# Patient Record
Sex: Male | Born: 1960 | Race: White | Hispanic: No | Marital: Married | Smoking: Former smoker
Health system: Southern US, Community
[De-identification: ages and names within clinical notes are randomized; demographics above are authoritative.]

## PROBLEM LIST (undated history)

## (undated) DIAGNOSIS — G44009 Cluster headache syndrome, unspecified, not intractable: Secondary | ICD-10-CM

## (undated) DIAGNOSIS — E039 Hypothyroidism, unspecified: Secondary | ICD-10-CM

## (undated) DIAGNOSIS — G4730 Sleep apnea, unspecified: Secondary | ICD-10-CM

## (undated) DIAGNOSIS — E785 Hyperlipidemia, unspecified: Secondary | ICD-10-CM

## (undated) DIAGNOSIS — F419 Anxiety disorder, unspecified: Secondary | ICD-10-CM

## (undated) DIAGNOSIS — K219 Gastro-esophageal reflux disease without esophagitis: Secondary | ICD-10-CM

## (undated) DIAGNOSIS — F32A Depression, unspecified: Secondary | ICD-10-CM

## (undated) DIAGNOSIS — I10 Essential (primary) hypertension: Secondary | ICD-10-CM

## (undated) DIAGNOSIS — I2510 Atherosclerotic heart disease of native coronary artery without angina pectoris: Secondary | ICD-10-CM

## (undated) DIAGNOSIS — R002 Palpitations: Secondary | ICD-10-CM

## (undated) HISTORY — DX: Hyperlipidemia, unspecified: E78.5

## (undated) HISTORY — DX: Palpitations: R00.2

## (undated) HISTORY — PX: TONSILLECTOMY AND ADENOIDECTOMY: SUR1326

## (undated) HISTORY — DX: Hypothyroidism, unspecified: E03.9

---

## 2008-11-17 ENCOUNTER — Encounter: Payer: Self-pay | Admitting: Cardiology

## 2010-06-11 ENCOUNTER — Emergency Department (INDEPENDENT_AMBULATORY_CARE_PROVIDER_SITE_OTHER): Payer: 59

## 2010-06-11 ENCOUNTER — Emergency Department (HOSPITAL_BASED_OUTPATIENT_CLINIC_OR_DEPARTMENT_OTHER)
Admission: EM | Admit: 2010-06-11 | Discharge: 2010-06-11 | Disposition: A | Discharge status: Home / Self Care | Payer: 59 | Attending: Emergency Medicine | Admitting: Emergency Medicine

## 2010-06-11 DIAGNOSIS — M545 Low back pain, unspecified: Secondary | ICD-10-CM | POA: Insufficient documentation

## 2010-06-11 DIAGNOSIS — M79609 Pain in unspecified limb: Secondary | ICD-10-CM | POA: Insufficient documentation

## 2010-06-11 DIAGNOSIS — G8929 Other chronic pain: Secondary | ICD-10-CM | POA: Insufficient documentation

## 2010-06-12 ENCOUNTER — Other Ambulatory Visit: Payer: Self-pay | Admitting: Family Medicine

## 2010-06-12 ENCOUNTER — Ambulatory Visit
Admission: RE | Admit: 2010-06-12 | Discharge: 2010-06-12 | Disposition: A | Discharge status: Home / Self Care | Payer: 59 | Source: Ambulatory Visit | Attending: Family Medicine | Admitting: Family Medicine

## 2010-06-12 DIAGNOSIS — M545 Low back pain, unspecified: Secondary | ICD-10-CM

## 2011-11-07 ENCOUNTER — Emergency Department (HOSPITAL_BASED_OUTPATIENT_CLINIC_OR_DEPARTMENT_OTHER): Payer: 59

## 2011-11-07 ENCOUNTER — Emergency Department (HOSPITAL_BASED_OUTPATIENT_CLINIC_OR_DEPARTMENT_OTHER)
Admission: EM | Admit: 2011-11-07 | Discharge: 2011-11-07 | Disposition: A | Discharge status: Home / Self Care | Payer: 59 | Attending: Emergency Medicine | Admitting: Emergency Medicine

## 2011-11-07 ENCOUNTER — Encounter (HOSPITAL_BASED_OUTPATIENT_CLINIC_OR_DEPARTMENT_OTHER): Payer: Self-pay

## 2011-11-07 DIAGNOSIS — E039 Hypothyroidism, unspecified: Secondary | ICD-10-CM | POA: Insufficient documentation

## 2011-11-07 DIAGNOSIS — E785 Hyperlipidemia, unspecified: Secondary | ICD-10-CM | POA: Insufficient documentation

## 2011-11-07 DIAGNOSIS — Z87891 Personal history of nicotine dependence: Secondary | ICD-10-CM | POA: Insufficient documentation

## 2011-11-07 DIAGNOSIS — R51 Headache: Secondary | ICD-10-CM

## 2011-11-07 HISTORY — DX: Cluster headache syndrome, unspecified, not intractable: G44.009

## 2011-11-07 MED ORDER — HYDROMORPHONE HCL PF 2 MG/ML IJ SOLN
2.0000 mg | Freq: Once | INTRAMUSCULAR | Status: AC
  Administered 2011-11-07: 2 mg via INTRAMUSCULAR
  Filled 2011-11-07: qty 1

## 2011-11-07 MED ORDER — PROMETHAZINE HCL 25 MG/ML IJ SOLN
25.0000 mg | Freq: Once | INTRAMUSCULAR | Status: AC
  Administered 2011-11-07: 25 mg via INTRAMUSCULAR
  Filled 2011-11-07: qty 1

## 2011-11-07 MED ORDER — HYDROMORPHONE HCL PF 1 MG/ML IJ SOLN
1.0000 mg | Freq: Once | INTRAMUSCULAR | Status: AC
  Administered 2011-11-07: 1 mg via INTRAMUSCULAR

## 2011-11-07 MED ORDER — BUTORPHANOL TARTRATE 10 MG/ML NA SOLN: 1.0000 | NASAL | Status: AC | PRN

## 2011-11-07 MED ORDER — HYDROMORPHONE HCL PF 1 MG/ML IJ SOLN
INTRAMUSCULAR | Status: AC
  Administered 2011-11-07: 1 mg via INTRAMUSCULAR
  Filled 2011-11-07: qty 1

## 2011-11-07 NOTE — ED Provider Notes (Signed)
History     CSN: [id number]  Arrival date & time 11/07/11  1329   First MD Initiated Contact with Patient 11/07/11 1349      Chief Complaint  Patient presents with  . Headache    (Consider location/radiation/quality/duration/timing/severity/associated sxs/prior treatment) HPI Comments: Has a history of similar complaints about 15 years ago.  Was seen in the ER, had a ct scan done and followed up with a neurologist who felt as though he was experiencing cluster headaches.  Patient is a 51 y.o. male presenting with headaches. The history is provided by the patient.  Headache  This is a new problem. Episode onset: 3 days ago. The problem occurs constantly. The problem has been gradually worsening. The headache is associated with bright light. Pain location: top of head. The quality of the pain is described as throbbing. The pain is severe. The pain does not radiate. Pertinent negatives include no anorexia, no fever, no malaise/fatigue, no palpitations, no nausea and no vomiting. He has tried NSAIDs, oral narcotic analgesics and cold packs for the symptoms. The treatment provided no relief.    Past Medical History  Diagnosis Date  . Palpitations   . Hypothyroidism   . Hyperlipidemia     Mild  . Headache, cluster     History reviewed. No pertinent past surgical history.  Family History  Problem Relation Age of Onset  . Hypertension Mother   . Hyperlipidemia Father   . Hypertension Father   . Heart attack Maternal Uncle 51  . Heart attack Maternal Grandfather 57    History  Substance Use Topics  . Smoking status: Former Games developer  . Smokeless tobacco: Not on file  . Alcohol Use: Yes      Review of Systems  Constitutional: Negative for fever and malaise/fatigue.  Cardiovascular: Negative for palpitations.  Gastrointestinal: Negative for nausea, vomiting and anorexia.  Neurological: Positive for headaches.  All other systems reviewed and are negative.    Allergies    Review of patient's allergies indicates no known allergies.  Home Medications   Current Outpatient Rx  Name Route Sig Dispense Refill  . BENADRYL PO Oral Take by mouth.    Marland Kitchen LEVOTHYROXINE SODIUM 88 MCG PO TABS Oral Take 88 mcg by mouth daily.    Marland Kitchen SLEEP AID PO Oral Take by mouth as needed.    Marland Kitchen ESOMEPRAZOLE MAGNESIUM 40 MG PO CPDR Oral Take 40 mg by mouth daily before breakfast.    . HYDROCODONE-ACETAMINOPHEN PO Oral Take by mouth as needed.    Marland Kitchen RANOLAZINE ER 500 MG PO TB12 Oral Take 500 mg by mouth as directed.      BP 151/89  Temp 98.3 F (36.8 C) (Oral)  Resp 20  Ht 6\' 1"  (1.854 m)  Wt 240 lb (108.863 kg)  BMI 31.66 kg/m2  SpO2 97%  Physical Exam  Nursing note and vitals reviewed. Constitutional: He is oriented to person, place, and time. He appears well-developed and well-nourished. No distress.  HENT:  Head: Normocephalic and atraumatic.  Mouth/Throat: Oropharynx is clear and moist.  Eyes: EOM are normal. Pupils are equal, round, and reactive to light.  Neck: Normal range of motion. Neck supple.  Musculoskeletal: Normal range of motion. He exhibits no edema.  Neurological: He is alert and oriented to person, place, and time. No cranial nerve deficit. He exhibits normal muscle tone. Coordination normal.  Skin: Skin is warm and dry. He is not diaphoretic.    ED Course  Procedures (including critical care time)  Labs Reviewed - No data to display No results found.   No diagnosis found.    MDM  The ct scan looks okay.  There is no evidence of bleed or tumor.  He says that he had similar symptoms about 15 years ago and was told had a cluster headache by a neurologist but does not recall who he saw.  The neurologist gave him Stadol nasal spray which seemed to help.  He was given dilaudid and phenergan today and is feeling somewhat better.  He will be discharged with stadol, to follow up with neurologist if not improving.          Geoffery Lyons, MD 11/07/11  [phone]

## 2011-11-07 NOTE — ED Notes (Signed)
C/o HA to top of head x 4-5 days-reports hx of same feels like "cluster HA"

## 2012-07-07 ENCOUNTER — Institutional Professional Consult (permissible substitution): Payer: 59 | Admitting: Pulmonary Disease

## 2012-07-27 ENCOUNTER — Encounter: Payer: Self-pay | Admitting: Pulmonary Disease

## 2012-07-27 ENCOUNTER — Ambulatory Visit (INDEPENDENT_AMBULATORY_CARE_PROVIDER_SITE_OTHER): Payer: 59 | Admitting: Pulmonary Disease

## 2012-07-27 VITALS — BP 122/82 | HR 85 | Temp 97.5°F | Ht 73.0 in | Wt 256.4 lb

## 2012-07-27 DIAGNOSIS — G4733 Obstructive sleep apnea (adult) (pediatric): Secondary | ICD-10-CM | POA: Insufficient documentation

## 2012-07-27 NOTE — Assessment & Plan Note (Addendum)
Given excessive daytime somnolence, narrow pharyngeal exam, witnessed apneas & loud snoring, obstructive sleep apnea is very likely & an overnight polysomnogram will be scheduled as a split study. The pathophysiology of obstructive sleep apnea , it's cardiovascular consequences & modes of treatment including CPAP were discused with the patient in detail & they evidenced understanding.  You likely have obstructive sleep apnea  Proceed with home study Options would be home CPAP titration vs in lab. Given his inability to tolerate auto CPAP settings on his home trial would suggest and in lab titration so that the technician attendants can troubleshoot problems as they arise during his first night with CPAP

## 2012-07-27 NOTE — Progress Notes (Signed)
Subjective:    Patient ID: Jerry Keller, male    DOB: 11-25-1960, 52 y.o.   MRN: [id number]  HPI  52 year old remote smoker self-referred for evaluation of obstructive sleep apnea. His wife has witnessed apneas and loud snoring. Epworth sleepiness score is 10 and a report sleepiness while laying down to rest in the afternoon or sitting quietly after lunch and occasionally when sitting and reading. He reports at least a couple of episodes where he has file sleepy while driving. He reports weekend naps for 2 hours in the recliner. Bedtime is a 30 p.m., sleep latency is about 5 minutes, he starts sleeping on his back and hip and stomach with 2 pillows, he is at least one awakening for nocturia and several arousals due to snoring. He is out of bed at 5:30 AM feeling tired and occasional headache requiring Tylenol sinus. He thinks he is a mouth breather. He drinks one Diet Coke supper and denies excessive caffeinated beverages or alcohol use. He was diagnosed with low testosterone level and this has been treated with supplements. He obtained his CPAP machine from a friend and trial this but was unable to tolerate a full face mask or a nasal mask on auto settings.     Past Medical History  Diagnosis Date  . Palpitations   . Hypothyroidism   . Hyperlipidemia     Mild  . Headache, cluster     Past Surgical History  Procedure Laterality Date  . Tonsillectomy and adenoidectomy      as a child    No Known Allergies  History   Social History  . Marital Status: Married    Spouse Name: N/A    Number of Children: N/A  . Years of Education: N/A   Occupational History  . maintaince    Social History Main Topics  . Smoking status: Former Smoker -- 1.00 packs/day for 6 years    Types: Cigarettes    Quit date: 04/15/2000  . Smokeless tobacco: Not on file  . Alcohol Use: Yes     Comment: socially  . Drug Use: No  . Sexually Active: Not on file   Other Topics Concern  . Not on file    Social History Narrative  . No narrative on file    Family History  Problem Relation Age of Onset  . Hypertension Mother   . Hyperlipidemia Father   . Hypertension Father   . Heart attack Maternal Uncle 51  . Heart attack Maternal Grandfather 57  . Prostate cancer Father   . Sleep apnea Father      Review of Systems Constitutional: negative for anorexia, fevers and sweats  Eyes: negative for irritation, redness and visual disturbance  Ears, nose, mouth, throat, and face: negative for earaches, epistaxis, nasal congestion and sore throat  Respiratory: negative for cough, dyspnea on exertion, sputum and wheezing  Cardiovascular: negative for chest pain, dyspnea, lower extremity edema, orthopnea, palpitations and syncope  Gastrointestinal: negative for abdominal pain, constipation, diarrhea, melena, nausea and vomiting  Genitourinary:negative for dysuria, frequency and hematuria  Hematologic/lymphatic: negative for bleeding, easy bruising and lymphadenopathy  Musculoskeletal:negative for arthralgias, muscle weakness and stiff joints  Neurological: negative for coordination problems, gait problems, headaches and weakness  Endocrine: negative for diabetic symptoms including polydipsia, polyuria and weight loss     Objective:   Physical Exam   Gen. Pleasant, obese, in no distress, normal affect ENT - no lesions, no post nasal drip, class 2-3 airway Neck: No JVD, no thyromegaly,  no carotid bruits Lungs: no use of accessory muscles, no dullness to percussion, decreased without rales or rhonchi  Cardiovascular: Rhythm regular, heart sounds  normal, no murmurs or gallops, no peripheral edema Abdomen: soft and non-tender, no hepatosplenomegaly, BS normal. Musculoskeletal: No deformities, no cyanosis or clubbing Neuro:  alert, non focal, no tremors        Assessment & Plan:

## 2012-07-27 NOTE — Patient Instructions (Addendum)
You likely have obstructive sleep apnea  Proceed with home study Options would be home CPAP titration vs in lab

## 2012-08-03 ENCOUNTER — Telehealth: Payer: Self-pay | Admitting: Pulmonary Disease

## 2012-08-03 NOTE — Telephone Encounter (Signed)
Called and spoke to pt his [facility] does not required precert for home sleep test he wull pick up alice 08/04/12  Tobe Sos

## 2012-08-07 ENCOUNTER — Ambulatory Visit (INDEPENDENT_AMBULATORY_CARE_PROVIDER_SITE_OTHER): Payer: 59

## 2012-08-07 DIAGNOSIS — G4733 Obstructive sleep apnea (adult) (pediatric): Secondary | ICD-10-CM

## 2012-08-09 DIAGNOSIS — G4733 Obstructive sleep apnea (adult) (pediatric): Secondary | ICD-10-CM

## 2012-08-10 ENCOUNTER — Encounter: Payer: Self-pay | Admitting: *Deleted

## 2012-08-18 ENCOUNTER — Telehealth: Payer: Self-pay | Admitting: Pulmonary Disease

## 2012-08-18 DIAGNOSIS — G4733 Obstructive sleep apnea (adult) (pediatric): Secondary | ICD-10-CM

## 2012-08-18 NOTE — Telephone Encounter (Signed)
Home study showed severe OSA - stopped breathing 80 times/h Suggest CPAP titration study

## 2012-08-20 ENCOUNTER — Other Ambulatory Visit: Payer: Self-pay | Admitting: Pulmonary Disease

## 2012-08-20 DIAGNOSIS — G4733 Obstructive sleep apnea (adult) (pediatric): Secondary | ICD-10-CM

## 2012-08-20 NOTE — Telephone Encounter (Signed)
Jerry Keller spoke with pt.

## 2012-08-20 NOTE — Telephone Encounter (Signed)
lmomtcb x1 for pt 

## 2012-08-31 ENCOUNTER — Ambulatory Visit (HOSPITAL_BASED_OUTPATIENT_CLINIC_OR_DEPARTMENT_OTHER): Payer: 59 | Attending: Pulmonary Disease

## 2012-08-31 VITALS — Ht 73.0 in | Wt 244.0 lb

## 2012-08-31 DIAGNOSIS — R0609 Other forms of dyspnea: Secondary | ICD-10-CM | POA: Insufficient documentation

## 2012-08-31 DIAGNOSIS — Z9989 Dependence on other enabling machines and devices: Secondary | ICD-10-CM

## 2012-08-31 DIAGNOSIS — K3189 Other diseases of stomach and duodenum: Secondary | ICD-10-CM | POA: Insufficient documentation

## 2012-08-31 DIAGNOSIS — R6889 Other general symptoms and signs: Secondary | ICD-10-CM | POA: Insufficient documentation

## 2012-08-31 DIAGNOSIS — R5383 Other fatigue: Secondary | ICD-10-CM | POA: Insufficient documentation

## 2012-08-31 DIAGNOSIS — R0989 Other specified symptoms and signs involving the circulatory and respiratory systems: Secondary | ICD-10-CM | POA: Insufficient documentation

## 2012-08-31 DIAGNOSIS — G4730 Sleep apnea, unspecified: Secondary | ICD-10-CM | POA: Insufficient documentation

## 2012-08-31 DIAGNOSIS — R5381 Other malaise: Secondary | ICD-10-CM | POA: Insufficient documentation

## 2012-08-31 DIAGNOSIS — R51 Headache: Secondary | ICD-10-CM | POA: Insufficient documentation

## 2012-08-31 DIAGNOSIS — G4733 Obstructive sleep apnea (adult) (pediatric): Secondary | ICD-10-CM

## 2012-09-11 ENCOUNTER — Telehealth: Payer: Self-pay | Admitting: Pulmonary Disease

## 2012-09-11 DIAGNOSIS — G4733 Obstructive sleep apnea (adult) (pediatric): Secondary | ICD-10-CM

## 2012-09-11 DIAGNOSIS — G4710 Hypersomnia, unspecified: Secondary | ICD-10-CM

## 2012-09-11 DIAGNOSIS — G4730 Sleep apnea, unspecified: Secondary | ICD-10-CM

## 2012-09-11 NOTE — Telephone Encounter (Signed)
CPAP titration showed correction with CPAP 12 cm, standard Airfit N10 mask, humidity, download in 4 wks Rx has been sent

## 2012-09-11 NOTE — Telephone Encounter (Signed)
Spoke with the pt and notified of results per RA He verbalized understanding and denied any questions

## 2012-09-11 NOTE — Telephone Encounter (Signed)
lmtcb x1 

## 2012-09-12 NOTE — Procedures (Signed)
NAME:  Jerry Keller, Jerry Keller NO.:  [id number]  MEDICAL RECORD NO.:  [id number]          PATIENT TYPE:  OUT  LOCATION:  SLEEP CENTER                 FACILITY:  [facility]  PHYSICIAN:  Oretha Milch, MD      DATE OF BIRTH:  [birth year]  DATE OF STUDY:  09/11/2012                           NOCTURNAL POLYSOMNOGRAM  REFERRING PHYSICIAN:  Oretha Milch, MD  INDICATION FOR STUDY:  Jerry Keller is a 52 year old woman with obstructive sleep apnea with witnessed apneas, and loud snoring.  At the time of this study, he weighed 244 pounds with a height of 6 feet 1 inches, BMI of 32, neck size of 19 inches.  Epworth sleepiness score of 10.  This CPAP titration study was performed with a sleep technologist in attendance.  EEG, EOG, EMG, EKG, and respiratory parameters were recorded.  Sleep stages, arousals, limb movements, and respiratory data were scored according to criteria laid out by the American Academy of Sleep Medicine.  EPWORTH SLEEPINESS SCORE:10   SLEEP ARCHITECTURE:  Lights out was at 9:44 p.m., lights on was at 3:40 p.m.  Total sleep time was 370 minutes with a sleep period time of 333 minutes and a sleep efficiency of 89%.  Sleep latency was 7 minutes with a latency to REM sleep of 62 minutes and awake after sleep onset of 33 minutes.  Sleep stages as a percentage of total sleep time was N1 4%, N2 65%, N3 5%, REM sleep 25% (80 minutes).  Supine sleep accounted for 242 minutes and supine REM sleep was noted for 55 minutes.  REM sleep was noted in 3-4 stages with the longest being around 11 a.m.  RESPIRATORY DATA:  CPAP was initiated at 5 cm with a standard full-face mask.  CPAP was titrated to 13 cm due to snoring and residual events at a level of 12 cm for 62 minutes of sleep including 4.5 minutes of REM sleep, 3 hypopneas were noted with an AHI of 3 events per hour and lowest desaturation of 91%.  This appears to be the optimal level used during the study. On  increasing the level to 13 cm, the patient pulled the mask off and complained of headache upon awakening.  AROUSAL DATA:  The arousal index was 4.9 events per hour.  Of these, few were spontaneous and the rest were associated with respiratory events.  LIMB MOVEMENT DATA:  No significant limb movements were noted.  OXYGEN SATURATION DATA:  The desaturation index was 10 events per hour with the lowest desaturation of 84%.  He spent less than 1 minute with a saturation less than 88%.   CARDIAC DATA:  The low heart rate was 36 beats per minute.  During REM sleep, the high heart rate recorded was an artifact.  No arrhythmias were noted.  DISCUSSION:  He was desensitized with a standard full-face mask.  While most events were controlled on 11 cm, CPAP was titrated further for snoring. He did not seemed to tolerate 13 cm well.   IMPRESSIONS-RECOMMENDATIONS: 1. Moderate obstructive sleep apnea with hypopneas causing sleep     fragmentation and oxygen desaturation. 2. This was corrected by CPAP of 12 cm with  a standard full-face mask.     Titration appears to be optimal. 3. No evidence of cardiac arrhythmias, limb movements, or behavioral     disturbance during sleep.  RECOMMENDATION: 1. CPAP can be initiated at 12 cm with a standard full-face mask and     compliance monitored at this level. 2. He should be cautioned against driving when sleepy.  He should be     asked to avoid medications with sedative side effects.     Oretha Milch, MD    RVA/MEDQ  D:  09/11/2012 12:11:49  T:  09/12/2012 00:16:42  Job:  [id number]

## 2012-09-15 ENCOUNTER — Other Ambulatory Visit: Payer: Self-pay | Admitting: Pulmonary Disease

## 2012-09-15 DIAGNOSIS — G4733 Obstructive sleep apnea (adult) (pediatric): Secondary | ICD-10-CM

## 2012-09-22 ENCOUNTER — Telehealth: Payer: Self-pay | Admitting: Pulmonary Disease

## 2012-09-22 NOTE — Telephone Encounter (Signed)
lmomtcb x1 for heather 

## 2012-09-23 NOTE — Telephone Encounter (Signed)
Called, spoke with Herbert Seta.  States she has actually already received all of the information needed from a Irina Okelly at [employer].  Nothing further needed at this time.

## 2012-11-10 ENCOUNTER — Encounter: Payer: Self-pay | Admitting: *Deleted

## 2012-11-21 ENCOUNTER — Telehealth: Payer: Self-pay | Admitting: Pulmonary Disease

## 2012-11-21 NOTE — Telephone Encounter (Signed)
He needs post cpap Fu OV Download  8/14 >> good usage, good control of events on 12 cm

## 2012-11-23 NOTE — Telephone Encounter (Signed)
Pt appt scheduled. Nothing further needed

## 2012-11-23 NOTE — Telephone Encounter (Signed)
LMTCB x1 for pt.  

## 2013-01-11 ENCOUNTER — Ambulatory Visit: Payer: 59 | Admitting: Pulmonary Disease

## 2013-07-07 ENCOUNTER — Other Ambulatory Visit: Payer: Self-pay | Admitting: Gastroenterology

## 2013-07-09 ENCOUNTER — Encounter (HOSPITAL_COMMUNITY): Payer: Self-pay | Admitting: *Deleted

## 2013-07-12 ENCOUNTER — Encounter (HOSPITAL_COMMUNITY): Payer: Self-pay | Admitting: Pharmacy Technician

## 2013-07-30 ENCOUNTER — Encounter (HOSPITAL_COMMUNITY): Payer: 59 | Admitting: Anesthesiology

## 2013-07-30 ENCOUNTER — Ambulatory Visit (HOSPITAL_COMMUNITY)
Admission: RE | Admit: 2013-07-30 | Discharge: 2013-07-30 | Disposition: A | Discharge status: Home / Self Care | Payer: 59 | Source: Ambulatory Visit | Attending: Gastroenterology | Admitting: Gastroenterology

## 2013-07-30 ENCOUNTER — Encounter (HOSPITAL_COMMUNITY): Payer: Self-pay

## 2013-07-30 ENCOUNTER — Encounter (HOSPITAL_COMMUNITY)
Admission: RE | Disposition: A | Discharge status: Home / Self Care | Payer: Self-pay | Source: Ambulatory Visit | Attending: Gastroenterology

## 2013-07-30 ENCOUNTER — Ambulatory Visit (HOSPITAL_COMMUNITY): Payer: 59 | Admitting: Anesthesiology

## 2013-07-30 DIAGNOSIS — K921 Melena: Secondary | ICD-10-CM | POA: Insufficient documentation

## 2013-07-30 DIAGNOSIS — E039 Hypothyroidism, unspecified: Secondary | ICD-10-CM | POA: Insufficient documentation

## 2013-07-30 DIAGNOSIS — Z87891 Personal history of nicotine dependence: Secondary | ICD-10-CM | POA: Insufficient documentation

## 2013-07-30 DIAGNOSIS — E785 Hyperlipidemia, unspecified: Secondary | ICD-10-CM | POA: Insufficient documentation

## 2013-07-30 DIAGNOSIS — G4730 Sleep apnea, unspecified: Secondary | ICD-10-CM | POA: Insufficient documentation

## 2013-07-30 DIAGNOSIS — R079 Chest pain, unspecified: Secondary | ICD-10-CM | POA: Insufficient documentation

## 2013-07-30 HISTORY — PX: COLONOSCOPY: SHX5424

## 2013-07-30 SURGERY — COLONOSCOPY
Anesthesia: Monitor Anesthesia Care

## 2013-07-30 MED ORDER — PROPOFOL 10 MG/ML IV BOLUS
INTRAVENOUS | Status: AC
  Filled 2013-07-30: qty 20

## 2013-07-30 MED ORDER — KETAMINE HCL 10 MG/ML IJ SOLN
INTRAMUSCULAR | Status: DC | PRN
  Administered 2013-07-30: 20 mg via INTRAVENOUS
  Administered 2013-07-30: 10 mg via INTRAVENOUS

## 2013-07-30 MED ORDER — MIDAZOLAM HCL 5 MG/5ML IJ SOLN
INTRAMUSCULAR | Status: DC | PRN
  Administered 2013-07-30: 2 mg via INTRAVENOUS

## 2013-07-30 MED ORDER — PROPOFOL INFUSION 10 MG/ML OPTIME
INTRAVENOUS | Status: DC | PRN
  Administered 2013-07-30: 100 ug/kg/min via INTRAVENOUS

## 2013-07-30 MED ORDER — LIDOCAINE HCL (CARDIAC) 20 MG/ML IV SOLN
INTRAVENOUS | Status: AC
Start: 2013-07-30 — End: 2013-07-30
  Filled 2013-07-30: qty 5

## 2013-07-30 MED ORDER — SODIUM CHLORIDE 0.9 % IV SOLN: INTRAVENOUS | Status: DC

## 2013-07-30 MED ORDER — FENTANYL CITRATE 0.05 MG/ML IJ SOLN: INTRAMUSCULAR | Status: DC | PRN

## 2013-07-30 MED ORDER — LACTATED RINGERS IV SOLN
INTRAVENOUS | Status: DC
  Administered 2013-07-30: 1000 mL via INTRAVENOUS

## 2013-07-30 MED ORDER — LIDOCAINE HCL (CARDIAC) 20 MG/ML IV SOLN
INTRAVENOUS | Status: DC | PRN
  Administered 2013-07-30: 100 mg via INTRAVENOUS

## 2013-07-30 MED ORDER — MIDAZOLAM HCL 2 MG/2ML IJ SOLN
INTRAMUSCULAR | Status: AC
  Filled 2013-07-30: qty 2

## 2013-07-30 NOTE — Anesthesia Preprocedure Evaluation (Addendum)
Anesthesia Evaluation    Airway       Dental   Pulmonary former smoker,          Cardiovascular     Neuro/Psych    GI/Hepatic   Endo/Other    Renal/GU      Musculoskeletal   Abdominal   Peds  Hematology   Anesthesia Other Findings   Reproductive/Obstetrics                          Anesthesia Physical Anesthesia Plan  ASA: II  Anesthesia Plan: MAC   Post-op Pain Management:    Induction: Intravenous  Airway Management Planned: Simple Face Mask  Additional Equipment:   Intra-op Plan:   Post-operative Plan:   Informed Consent:   Plan Discussed with:   Anesthesia Plan Comments:         Anesthesia Quick Evaluation

## 2013-07-30 NOTE — Anesthesia Postprocedure Evaluation (Signed)
  Anesthesia Post-op Note  Patient: Jerry Keller  Procedure(s) Performed: Procedure(s) (LRB): COLONOSCOPY (N/A)  Patient Location: PACU  Anesthesia Type: MAC  Level of Consciousness: awake and alert   Airway and Oxygen Therapy: Patient Spontanous Breathing  Post-op Pain: mild  Post-op Assessment: Post-op Vital signs reviewed, Patient's Cardiovascular Status Stable, Respiratory Function Stable, Patent Airway and No signs of Nausea or vomiting  Last Vitals:  Filed Vitals:   07/30/13 1050  BP: 168/93  Pulse:   Temp:   Resp: 14    Post-op Vital Signs: stable   Complications: No apparent anesthesia complications

## 2013-07-30 NOTE — Op Note (Signed)
[facility] [place], [address]   OPERATIVE PROCEDURE REPORT  PATIENT: Jerry Keller, Jerry Keller  MR#: [id number] BIRTHDATE: 1960-05-22  GENDER: Male ENDOSCOPIST: Carol Ada, MD ASSISTANT:   Sharon Mt, Endo Technician Hilma Favors, RN PROCEDURE DATE: 07/30/2013 PROCEDURE:   Colonoscopy with snare polypectomy ASA CLASS:   Class III INDICATIONS:Colorectal cancer screening. MEDICATIONS: MAC sedation, administered by CRNA  DESCRIPTION OF PROCEDURE:   After the risks benefits and alternatives of the procedure were thoroughly explained, informed consent was obtained.  A digital rectal exam revealed no abnormalities of the rectum.    The Pentax Ped Colon [id number] endoscope was introduced through the anus  and advanced to the cecum, which was identified by both the appendix and ileocecal valve , No adverse events experienced.    The quality of the prep was excellent. .  The instrument was then slowly withdrawn as the colon was fully examined.     FINDINGS: A 4 mm sessile descending colon polyp was removed with a cold snare.  No evidence of any masses, inflammation, ulcerations, erosions, or vascular abnormalities.   Retroflexed views revealed no abnormalities.     The scope was then withdrawn from the patient and the procedure terminated.  COMPLICATIONS: There were no complications.  IMPRESSION: 1) Polyp.  RECOMMENDATIONS: 1) Await biopsy results. 2) Repeat the colonoscopy in 5-10 years.  _______________________________ eSignedCarol Ada, MD 07/30/2013 10:27 AM

## 2013-07-30 NOTE — Transfer of Care (Signed)
Immediate Anesthesia Transfer of Care Note  Patient: Jerry Keller  Procedure(s) Performed: Procedure(s): COLONOSCOPY (N/A)  Patient Location: PACU and Endoscopy Unit  Anesthesia Type:MAC  Level of Consciousness: awake, alert , oriented and patient cooperative  Airway & Oxygen Therapy: Patient Spontanous Breathing and Patient connected to face mask oxygen  Post-op Assessment: Report given to PACU RN, Post -op Vital signs reviewed and stable and Patient moving all extremities  Post vital signs: Reviewed and stable  Complications: No apparent anesthesia complications

## 2013-07-30 NOTE — Discharge Instructions (Signed)
Colonoscopy, Care After °Refer to this sheet in the next few weeks. These instructions provide you with information on caring for yourself after your procedure. Your health care provider may also give you more specific instructions. Your treatment has been planned according to current medical practices, but problems sometimes occur. Call your health care provider if you have any problems or questions after your procedure. °WHAT TO EXPECT AFTER THE PROCEDURE  °After your procedure, it is typical to have the following: °· A small amount of blood in your stool. °· Moderate amounts of gas and mild abdominal cramping or bloating. °HOME CARE INSTRUCTIONS °· Do not drive, operate machinery, or sign important documents for 24 hours. °· You may shower and resume your regular physical activities, but move at a slower pace for the first 24 hours. °· Take frequent rest periods for the first 24 hours. °· Walk around or put a warm pack on your abdomen to help reduce abdominal cramping and bloating. °· Drink enough fluids to keep your urine clear or pale yellow. °· You may resume your normal diet as instructed by your health care provider. Avoid heavy or fried foods that are hard to digest. °· Avoid drinking alcohol for 24 hours or as instructed by your health care provider. °· Only take over-the-counter or prescription medicines as directed by your health care provider. °· If a tissue sample (biopsy) was taken during your procedure: °· Do not take aspirin or blood thinners for 7 days, or as instructed by your health care provider. °· Do not drink alcohol for 7 days, or as instructed by your health care provider. °· Eat soft foods for the first 24 hours. °SEEK MEDICAL CARE IF: °You have persistent spotting of blood in your stool 2 3 days after the procedure. °SEEK IMMEDIATE MEDICAL CARE IF: °· You have more than a small spotting of blood in your stool. °· You pass large blood clots in your stool. °· Your abdomen is swollen  (distended). °· You have nausea or vomiting. °· You have a fever. °· You have increasing abdominal pain that is not relieved with medicine. °Document Released: 11/14/2003 Document Revised: 01/20/2013 Document Reviewed: 12/07/2012 °ExitCare® Patient Information ©2014 [employer]. ° °

## 2013-07-30 NOTE — H&P (Signed)
  Jerry Keller HPI: Two to three months ago the patient started to have hematochezia.  He attributes this to his work and taking 81 mg of ASA.  Three days ago he noticed more blood.  There is no pain with passage of a bowel movement and he denied having a prior colonoscopy.  He has a history of severe sleep apnea.  The patient also complains of chest pain and the work up was negative with Dr. Redmond Pulling.  No family history of colon cancer.  Past Medical History  Diagnosis Date  . Palpitations   . Hypothyroidism   . Hyperlipidemia     Mild  . Headache, cluster     Past Surgical History  Procedure Laterality Date  . Tonsillectomy and adenoidectomy      as a child    Family History  Problem Relation Age of Onset  . Hypertension Mother   . Hyperlipidemia Father   . Hypertension Father   . Heart attack Maternal Uncle 51  . Heart attack Maternal Grandfather 57  . Prostate cancer Father   . Sleep apnea Father     Social History:  reports that he quit smoking about 13 years ago. His smoking use included Cigarettes. He has a 6 pack-year smoking history. He does not have any smokeless tobacco history on file. He reports that he drinks alcohol. He reports that he does not use illicit drugs.  Allergies: No Known Allergies  Medications:  Scheduled:  Continuous: . sodium chloride    . lactated ringers 1,000 mL (07/30/13 0903)    No results found for this or any previous visit (from the past 24 hour(s)).   No results found.  ROS:  As stated above in the HPI otherwise negative.  Blood pressure 138/85, temperature 98.1 F (36.7 C), temperature source Oral, resp. rate 18, SpO2 96.00%.    PE: Gen: NAD, Alert and Oriented HEENT:  [place]/AT, EOMI Neck: Supple, no LAD Lungs: CTA Bilaterally CV: RRR without M/G/R ABM: Soft, NTND, +BS Ext: No C/C/E  Assessment/Plan: 1) Screening. 2) Hematochezia.  Plan: Colonoscopy.  Beryle Beams 07/30/2013, 9:51 AM

## 2013-08-02 ENCOUNTER — Encounter (HOSPITAL_COMMUNITY): Payer: Self-pay | Admitting: Gastroenterology

## 2014-03-08 ENCOUNTER — Encounter (HOSPITAL_COMMUNITY)
Admission: EM | Disposition: A | Discharge status: Home / Self Care | Payer: Self-pay | Source: Home / Self Care | Attending: Cardiovascular Disease

## 2014-03-08 ENCOUNTER — Encounter (HOSPITAL_COMMUNITY): Payer: Self-pay | Admitting: Physical Medicine and Rehabilitation

## 2014-03-08 ENCOUNTER — Observation Stay (HOSPITAL_COMMUNITY)
Admission: EM | Admit: 2014-03-08 | Discharge: 2014-03-09 | Disposition: A | Discharge status: Home / Self Care | Payer: 59 | Attending: Cardiovascular Disease | Admitting: Cardiovascular Disease

## 2014-03-08 ENCOUNTER — Emergency Department (HOSPITAL_COMMUNITY): Payer: 59

## 2014-03-08 DIAGNOSIS — I4510 Unspecified right bundle-branch block: Secondary | ICD-10-CM | POA: Insufficient documentation

## 2014-03-08 DIAGNOSIS — R0789 Other chest pain: Secondary | ICD-10-CM

## 2014-03-08 DIAGNOSIS — R079 Chest pain, unspecified: Secondary | ICD-10-CM

## 2014-03-08 DIAGNOSIS — Z6833 Body mass index (BMI) 33.0-33.9, adult: Secondary | ICD-10-CM | POA: Insufficient documentation

## 2014-03-08 DIAGNOSIS — Z8249 Family history of ischemic heart disease and other diseases of the circulatory system: Secondary | ICD-10-CM | POA: Diagnosis not present

## 2014-03-08 DIAGNOSIS — I200 Unstable angina: Secondary | ICD-10-CM | POA: Diagnosis present

## 2014-03-08 DIAGNOSIS — E785 Hyperlipidemia, unspecified: Secondary | ICD-10-CM | POA: Insufficient documentation

## 2014-03-08 DIAGNOSIS — I2510 Atherosclerotic heart disease of native coronary artery without angina pectoris: Secondary | ICD-10-CM | POA: Diagnosis not present

## 2014-03-08 DIAGNOSIS — E039 Hypothyroidism, unspecified: Secondary | ICD-10-CM | POA: Diagnosis present

## 2014-03-08 DIAGNOSIS — Z87891 Personal history of nicotine dependence: Secondary | ICD-10-CM | POA: Diagnosis not present

## 2014-03-08 DIAGNOSIS — G4733 Obstructive sleep apnea (adult) (pediatric): Secondary | ICD-10-CM | POA: Diagnosis not present

## 2014-03-08 DIAGNOSIS — I209 Angina pectoris, unspecified: Secondary | ICD-10-CM

## 2014-03-08 DIAGNOSIS — Z8042 Family history of malignant neoplasm of prostate: Secondary | ICD-10-CM | POA: Diagnosis not present

## 2014-03-08 HISTORY — DX: Sleep apnea, unspecified: G47.30

## 2014-03-08 HISTORY — DX: Atherosclerotic heart disease of native coronary artery without angina pectoris: I25.10

## 2014-03-08 HISTORY — DX: Gastro-esophageal reflux disease without esophagitis: K21.9

## 2014-03-08 HISTORY — PX: LEFT HEART CATHETERIZATION WITH CORONARY ANGIOGRAM: SHX5451

## 2014-03-08 LAB — BASIC METABOLIC PANEL
ANION GAP: 10 (ref 5–15)
BUN: 12 mg/dL (ref 6–23)
CHLORIDE: 103 meq/L (ref 96–112)
CO2: 28 mEq/L (ref 19–32)
Calcium: 9.3 mg/dL (ref 8.4–10.5)
Creatinine, Ser: 1 mg/dL (ref 0.50–1.35)
GFR calc Af Amer: >90 mL/min (ref >90)
GFR, EST NON AFRICAN AMERICAN: 85 mL/min — AB (ref >90)
Glucose, Bld: 102 mg/dL — ABNORMAL HIGH (ref 70–99)
POTASSIUM: 4.7 meq/L (ref 3.7–5.3)
Sodium: 141 mEq/L (ref 137–147)

## 2014-03-08 LAB — TSH: TSH: 1.87 u[IU]/mL (ref 0.350–4.500)

## 2014-03-08 LAB — CBG MONITORING, ED: GLUCOSE-CAPILLARY: 88 mg/dL (ref 70–99)

## 2014-03-08 LAB — CBC WITH DIFFERENTIAL/PLATELET
BASOS ABS: 0 10*3/uL (ref 0.0–0.1)
BASOS PCT: 1 % (ref 0–1)
EOS ABS: 0.2 10*3/uL (ref 0.0–0.7)
Eosinophils Relative: 3 % (ref 0–5)
HCT: 47.5 % (ref 39.0–52.0)
Hemoglobin: 15.7 g/dL (ref 13.0–17.0)
Lymphocytes Relative: 20 % (ref 12–46)
Lymphs Abs: 1.3 10*3/uL (ref 0.7–4.0)
MCH: 33.3 pg (ref 26.0–34.0)
MCHC: 33.1 g/dL (ref 30.0–36.0)
MCV: 100.8 fL — ABNORMAL HIGH (ref 78.0–100.0)
Monocytes Absolute: 0.8 10*3/uL (ref 0.1–1.0)
Monocytes Relative: 13 % — ABNORMAL HIGH (ref 3–12)
NEUTROS ABS: 4 10*3/uL (ref 1.7–7.7)
NEUTROS PCT: 63 % (ref 43–77)
Platelets: 232 10*3/uL (ref 150–400)
RBC: 4.71 MIL/uL (ref 4.22–5.81)
RDW: 12.4 % (ref 11.5–15.5)
WBC: 6.3 10*3/uL (ref 4.0–10.5)

## 2014-03-08 LAB — TROPONIN I
Troponin I: <0.3 ng/mL (ref <0.30)
Troponin I: <0.3 ng/mL (ref <0.30)

## 2014-03-08 LAB — I-STAT TROPONIN, ED: Troponin i, poc: 0 ng/mL (ref 0.00–0.08)

## 2014-03-08 LAB — MAGNESIUM: Magnesium: 2.2 mg/dL (ref 1.5–2.5)

## 2014-03-08 SURGERY — LEFT HEART CATHETERIZATION WITH CORONARY ANGIOGRAM
Anesthesia: LOCAL

## 2014-03-08 MED ORDER — METOPROLOL TARTRATE 12.5 MG HALF TABLET
12.5000 mg | ORAL_TABLET | Freq: Two times a day (BID) | ORAL | Status: DC
  Administered 2014-03-08: 12.5 mg via ORAL
  Filled 2014-03-08 (×2): qty 1

## 2014-03-08 MED ORDER — HEPARIN (PORCINE) IN NACL 2-0.9 UNIT/ML-% IJ SOLN
INTRAMUSCULAR | Status: AC
  Filled 2014-03-08: qty 1000

## 2014-03-08 MED ORDER — SALINE SPRAY 0.65 % NA SOLN
1.0000 | NASAL | Status: DC | PRN
  Filled 2014-03-08: qty 44

## 2014-03-08 MED ORDER — MORPHINE SULFATE 2 MG/ML IJ SOLN
2.0000 mg | Freq: Once | INTRAMUSCULAR | Status: AC
  Administered 2014-03-08: 2 mg via INTRAVENOUS
  Filled 2014-03-08: qty 1

## 2014-03-08 MED ORDER — ASPIRIN EC 81 MG PO TBEC: 81.0000 mg | DELAYED_RELEASE_TABLET | Freq: Every day | ORAL | Status: DC

## 2014-03-08 MED ORDER — PANTOPRAZOLE SODIUM 40 MG PO TBEC: 40.0000 mg | DELAYED_RELEASE_TABLET | Freq: Every day | ORAL | Status: DC

## 2014-03-08 MED ORDER — LEVOTHYROXINE SODIUM 88 MCG PO TABS
88.0000 ug | ORAL_TABLET | Freq: Every day | ORAL | Status: DC
  Administered 2014-03-09: 88 ug via ORAL
  Filled 2014-03-08: qty 1

## 2014-03-08 MED ORDER — NITROGLYCERIN 0.4 MG SL SUBL: 0.4000 mg | SUBLINGUAL_TABLET | SUBLINGUAL | Status: DC | PRN

## 2014-03-08 MED ORDER — FOLIC ACID 800 MCG PO TABS: 400.0000 ug | ORAL_TABLET | Freq: Every day | ORAL | Status: DC

## 2014-03-08 MED ORDER — ASPIRIN 300 MG RE SUPP: 300.0000 mg | RECTAL | Status: AC

## 2014-03-08 MED ORDER — HEPARIN SODIUM (PORCINE) 1000 UNIT/ML IJ SOLN
INTRAMUSCULAR | Status: AC
  Filled 2014-03-08: qty 1

## 2014-03-08 MED ORDER — ATORVASTATIN CALCIUM 80 MG PO TABS
80.0000 mg | ORAL_TABLET | Freq: Every day | ORAL | Status: DC
  Administered 2014-03-08: 80 mg via ORAL
  Filled 2014-03-08: qty 1

## 2014-03-08 MED ORDER — ONDANSETRON HCL 4 MG/2ML IJ SOLN: 4.0000 mg | Freq: Four times a day (QID) | INTRAMUSCULAR | Status: DC | PRN

## 2014-03-08 MED ORDER — VITAMIN B-12 1000 MCG PO TABS
2000.0000 ug | ORAL_TABLET | Freq: Every day | ORAL | Status: DC
  Filled 2014-03-08: qty 2

## 2014-03-08 MED ORDER — SODIUM CHLORIDE 0.9 % IJ SOLN: 3.0000 mL | INTRAMUSCULAR | Status: DC | PRN

## 2014-03-08 MED ORDER — VERAPAMIL HCL 2.5 MG/ML IV SOLN
INTRAVENOUS | Status: AC
  Filled 2014-03-08: qty 2

## 2014-03-08 MED ORDER — HEPARIN (PORCINE) IN NACL 100-0.45 UNIT/ML-% IJ SOLN
1350.0000 [IU]/h | INTRAMUSCULAR | Status: DC
  Administered 2014-03-08: 1350 [IU]/h via INTRAVENOUS
  Filled 2014-03-08: qty 250

## 2014-03-08 MED ORDER — MIDAZOLAM HCL 2 MG/2ML IJ SOLN
INTRAMUSCULAR | Status: AC
  Filled 2014-03-08: qty 2

## 2014-03-08 MED ORDER — HEPARIN BOLUS VIA INFUSION
4000.0000 [IU] | Freq: Once | INTRAVENOUS | Status: AC
Start: 2014-03-08 — End: 2014-03-08
  Administered 2014-03-08: 4000 [IU] via INTRAVENOUS
  Filled 2014-03-08: qty 4000

## 2014-03-08 MED ORDER — SODIUM CHLORIDE 0.9 % IV SOLN
250.0000 mL | INTRAVENOUS | Status: DC
  Administered 2014-03-08: 250 mL via INTRAVENOUS

## 2014-03-08 MED ORDER — ACETAMINOPHEN 325 MG PO TABS: 650.0000 mg | ORAL_TABLET | ORAL | Status: DC | PRN

## 2014-03-08 MED ORDER — SODIUM CHLORIDE 0.9 % IJ SOLN: 3.0000 mL | Freq: Two times a day (BID) | INTRAMUSCULAR | Status: DC

## 2014-03-08 MED ORDER — NITROGLYCERIN 1 MG/10 ML FOR IR/CATH LAB
INTRA_ARTERIAL | Status: AC
  Filled 2014-03-08: qty 10

## 2014-03-08 MED ORDER — VITAMIN B-12 1000 MCG PO TABS: 2000.0000 ug | ORAL_TABLET | Freq: Every day | ORAL | Status: DC

## 2014-03-08 MED ORDER — IBUPROFEN 200 MG PO TABS
200.0000 mg | ORAL_TABLET | Freq: Four times a day (QID) | ORAL | Status: DC | PRN
  Administered 2014-03-08: 400 mg via ORAL
  Filled 2014-03-08: qty 2

## 2014-03-08 MED ORDER — ASPIRIN 81 MG PO CHEW: 81.0000 mg | CHEWABLE_TABLET | ORAL | Status: DC

## 2014-03-08 MED ORDER — METOPROLOL TARTRATE 12.5 MG HALF TABLET: 12.5000 mg | ORAL_TABLET | Freq: Two times a day (BID) | ORAL | Status: DC

## 2014-03-08 MED ORDER — ASPIRIN 81 MG PO CHEW: 324.0000 mg | CHEWABLE_TABLET | ORAL | Status: AC

## 2014-03-08 MED ORDER — LIDOCAINE HCL (PF) 1 % IJ SOLN
INTRAMUSCULAR | Status: AC
  Filled 2014-03-08: qty 30

## 2014-03-08 MED ORDER — VITAMIN C 500 MG PO TABS
1000.0000 mg | ORAL_TABLET | Freq: Every day | ORAL | Status: DC
  Filled 2014-03-08: qty 2

## 2014-03-08 MED ORDER — FENTANYL CITRATE 0.05 MG/ML IJ SOLN
INTRAMUSCULAR | Status: AC
  Filled 2014-03-08: qty 2

## 2014-03-08 MED ORDER — SODIUM CHLORIDE 0.9 % IV SOLN: INTRAVENOUS | Status: AC

## 2014-03-08 MED ORDER — PANTOPRAZOLE SODIUM 40 MG PO TBEC
40.0000 mg | DELAYED_RELEASE_TABLET | Freq: Two times a day (BID) | ORAL | Status: DC
  Filled 2014-03-08: qty 1

## 2014-03-08 MED ORDER — FOLIC ACID 1 MG PO TABS
0.5000 mg | ORAL_TABLET | Freq: Every day | ORAL | Status: DC
  Filled 2014-03-08: qty 1

## 2014-03-08 MED ORDER — CHOLECALCIFEROL 10 MCG (400 UNIT) PO TABS
400.0000 [IU] | ORAL_TABLET | Freq: Every day | ORAL | Status: DC
  Filled 2014-03-08: qty 1

## 2014-03-08 MED ORDER — HYDROCODONE-ACETAMINOPHEN 5-325 MG PO TABS
0.5000 | ORAL_TABLET | Freq: Four times a day (QID) | ORAL | Status: DC | PRN
  Administered 2014-03-08: 1 via ORAL
  Filled 2014-03-08: qty 1

## 2014-03-08 MED ORDER — ASPIRIN 81 MG PO CHEW
324.0000 mg | CHEWABLE_TABLET | Freq: Once | ORAL | Status: AC
  Administered 2014-03-08: 324 mg via ORAL
  Filled 2014-03-08: qty 4

## 2014-03-08 MED ORDER — NITROGLYCERIN 2 % TD OINT
1.0000 [in_us] | TOPICAL_OINTMENT | Freq: Four times a day (QID) | TRANSDERMAL | Status: DC
  Administered 2014-03-08: 1 [in_us] via TOPICAL
  Filled 2014-03-08: qty 1

## 2014-03-08 MED ORDER — MAGNESIUM OXIDE 400 (241.3 MG) MG PO TABS
200.0000 mg | ORAL_TABLET | Freq: Every day | ORAL | Status: DC
  Filled 2014-03-08: qty 0.5

## 2014-03-08 MED ORDER — MAGNESIUM 250 MG PO TABS
250.0000 mg | ORAL_TABLET | Freq: Every day | ORAL | Status: DC
Start: 2014-03-08 — End: 2014-03-08

## 2014-03-08 MED ORDER — GI COCKTAIL ~~LOC~~
30.0000 mL | Freq: Once | ORAL | Status: AC
  Administered 2014-03-08: 30 mL via ORAL
  Filled 2014-03-08: qty 30

## 2014-03-08 NOTE — ED Notes (Signed)
Pt presents to department for evaluation of L sided intermittent chest pain, ongoing x2 months. 6/10 pressure sensation at the time. Respirations unlabored. Pt is alert and oriented x4.

## 2014-03-08 NOTE — ED Provider Notes (Signed)
CSN: [id number]     Arrival date & time 03/08/14  1020 History   First MD Initiated Contact with Patient 03/08/14 1030     Chief Complaint  Patient presents with  . Chest Pain   HPI Jerry Keller is a 53 year old man with history of hypothyroidism and HLD presenting with left-sided chest pressure that has been worsening over the last two months.  His chest pressure occasionally radiates to his neck and left shoulder and is associated with nausea and shortness of breath.  He initially reported that the pressure was worse with stress at work on exertion, but is currently occuring at rest and has not gone away since yesterday.  He reports diaphoresis and flushing associated with the discomfort.  He has also had some mild swelling of his legs that is worse after a long day of work.  He reports being under a lot of stress at work lately, which he thinks may be contributing.  He denies fevers, chills, or cough.  Past Medical History  Diagnosis Date  . Palpitations   . Hypothyroidism   . Hyperlipidemia     Mild  . Headache, cluster    Past Surgical History  Procedure Laterality Date  . Tonsillectomy and adenoidectomy      as a child  . Colonoscopy N/A 07/30/2013    Procedure: COLONOSCOPY;  Surgeon: Beryle Beams, MD;  Location: [facility];  Service: Endoscopy;  Laterality: N/A;   Family History  Problem Relation Age of Onset  . Hypertension Mother   . Hyperlipidemia Father   . Hypertension Father   . Heart attack Maternal Uncle 51  . Heart attack Maternal Grandfather 57  . Prostate cancer Father   . Sleep apnea Father    History  Substance Use Topics  . Smoking status: Former Smoker -- 1.00 packs/day for 6 years    Types: Cigarettes    Quit date: 04/15/2000  . Smokeless tobacco: Not on file  . Alcohol Use: Yes     Comment: socially    Review of Systems  Constitutional: Positive for diaphoresis. Negative for fever, chills, appetite change and fatigue.  HENT: Negative for  congestion, rhinorrhea and sore throat.   Eyes: Negative for visual disturbance.  Respiratory: Positive for chest tightness and shortness of breath. Negative for cough.   Cardiovascular: Positive for chest pain and leg swelling. Negative for palpitations.  Gastrointestinal: Positive for nausea. Negative for vomiting, abdominal pain, diarrhea, constipation and abdominal distention.  Genitourinary: Negative for dysuria and difficulty urinating.  Musculoskeletal: Negative for myalgias and arthralgias.  Skin: Negative for rash.  Neurological: Negative for dizziness, weakness, light-headedness, numbness and headaches.  Psychiatric/Behavioral: Negative for dysphoric mood and agitation.      Allergies  Review of patient's allergies indicates no known allergies.  Home Medications   Prior to Admission medications   Medication Sig Start Date End Date Taking? Authorizing Provider  aspirin EC 81 MG tablet Take 81 mg by mouth daily.    Historical Provider, MD  celecoxib (CELEBREX) 200 MG capsule Take 200 mg by mouth daily.    Historical Provider, MD  cholecalciferol (VITAMIN D) 400 UNITS TABS Take by mouth.    Historical Provider, MD  cyanocobalamin 2000 MCG tablet Take 1,000 mcg by mouth daily. Once a week    Historical Provider, MD  folic acid (FOLVITE) 258 MCG tablet Take 400 mcg by mouth daily.     Historical Provider, MD  HYDROcodone-acetaminophen (NORCO) 10-325 MG per tablet Take 1 tablet  by mouth every 6 (six) hours as needed for pain.    Historical Provider, MD  levothyroxine (SYNTHROID, LEVOTHROID) 88 MCG tablet Take 88 mcg by mouth daily before breakfast.     Historical Provider, MD  Magnesium 250 MG TABS Take 250 mg by mouth daily.     Historical Provider, MD  metoprolol tartrate (LOPRESSOR) 25 MG tablet Take 25 mg by mouth 2 (two) times daily. 03/03/14   Historical Provider, MD  Pyridoxine HCl (VITAMIN B-6 PO) Take 100 mg by mouth daily.     Historical Provider, MD  testosterone  cypionate (DEPOTESTOTERONE CYPIONATE) 200 MG/ML injection Inject into the muscle. Every 3 weeks    Historical Provider, MD  vitamin C (ASCORBIC ACID) 500 MG tablet Take 1,000 mg by mouth daily.     Historical Provider, MD   BP 135/90 mmHg  Pulse 71  Temp(Src) 97.4 F (36.3 C) (Oral)  Resp 15  Ht 6\' 1"  (1.854 m)  Wt 252 lb (114.306 kg)  BMI 33.25 kg/m2  SpO2 98% Physical Exam  Constitutional: He is oriented to person, place, and time. He appears well-developed and well-nourished. No distress.  HENT:  Head: Normocephalic and atraumatic.  Mouth/Throat: No oropharyngeal exudate.  Eyes: Conjunctivae and EOM are normal. Pupils are equal, round, and reactive to light. No scleral icterus.  Neck: Normal range of motion. Neck supple.  Cardiovascular: Normal rate, regular rhythm and normal heart sounds.   Pulmonary/Chest: Effort normal and breath sounds normal. No respiratory distress.  Abdominal: Soft. Bowel sounds are normal. He exhibits no distension. There is no tenderness.  Musculoskeletal: Normal range of motion. He exhibits no edema or tenderness.  Neurological: He is alert and oriented to person, place, and time. No cranial nerve deficit. He exhibits normal muscle tone.  Skin: Skin is warm. He is diaphoretic.  Flushed skin.    ED Course  Procedures (including critical care time) Labs Review Labs Reviewed  CBC WITH DIFFERENTIAL - Abnormal; Notable for the following:    MCV 100.8 (*)    Monocytes Relative 13 (*)    All other components within normal limits  BASIC METABOLIC PANEL  I-STAT TROPOININ, ED    Imaging Review No results found.   EKG Interpretation   Date/Time:  Tuesday March 08 2014 10:23:17 EST Ventricular Rate:  75 PR Interval:  160 QRS Duration: 98 QT Interval:  362 QTC Calculation: 404 R Axis:   36 Text Interpretation:  Normal sinus rhythm RSR' or QR pattern in V1  suggests right ventricular conduction delay Borderline ECG No previous  tracing  Confirmed by KNAPP  MD-J, JON ([id number]) on 03/08/2014 10:28:56 AM      MDM   Final diagnoses:  Chest pain   Symptoms highly suspicious of cardiac etiology, but initial EKG and troponin negative despite continuous pain since yesterday.  HEART score of 4 (highly suspicious, age, 1-2 risk factors).  Will await labs and x-ray and plan to touch base with cardiology.  2:42 pm: Labs and CXR unremarkable.  Seen by Dr. Gwenlyn Found of cardiology who plans to admit for catheterization.  Arman Filter, MD 03/08/14 [phone]

## 2014-03-08 NOTE — Interval H&P Note (Signed)
History and Physical Interval Note:  03/08/2014 3:43 PM  Jerry Keller  has presented today for cardiac cath with the diagnosis of unstable angina. The various methods of treatment have been discussed with the patient and family. After consideration of risks, benefits and other options for treatment, the patient has consented to  Procedure(s): LEFT HEART CATHETERIZATION WITH CORONARY ANGIOGRAM (N/A) as a surgical intervention .  The patient's history has been reviewed, patient examined, no change in status, stable for surgery.  I have reviewed the patient's chart and labs.  Questions were answered to the patient's satisfaction.    Cath Lab Visit (complete for each Cath Lab visit)  Clinical Evaluation Leading to the Procedure:   ACS: No.  Non-ACS:    Anginal Classification: CCS III  Anti-ischemic medical therapy: Minimal Therapy (1 class of medications)  Non-Invasive Test Results: No non-invasive testing performed  Prior CABG: No previous CABG         MCALHANY,CHRISTOPHER

## 2014-03-08 NOTE — H&P (Signed)
Clarance Bollard is an 53 y.o. male.    Primary Cardiologist: new PCP:  Woody Seller, MD  Chief Complaint: chest pain, up to daily sometimes twice a day , does wake from sleep   HPI: 53 year old WM with no cardiac hx. No HTN and no diabetes.  Over the last 2 months has been having daily chest pain sometimes twice a day.  It has awakened him from sleep at night.  Associated with nausea, diaphoresis and SOB.  Today at work it came on 10/10 for 6-8 min. With radiation to his lt arm and into Lt jaw.  He did not feel well and presented to ER.  Here he still has some pain.  EKG with incomplete Rt BBB.  Troponin neg.  Has not eaten since early AM.     Past Medical History  Diagnosis Date  . Palpitations   . Hypothyroidism   . Hyperlipidemia     Mild  . Headache, cluster     Past Surgical History  Procedure Laterality Date  . Tonsillectomy and adenoidectomy      as a child  . Colonoscopy N/A 07/30/2013    Procedure: COLONOSCOPY;  Surgeon: Beryle Beams, MD;  Location: [facility];  Service: Endoscopy;  Laterality: N/A;    Family History  Problem Relation Age of Onset  . Hypertension Mother   . Hyperlipidemia Father   . Hypertension Father   . Heart attack Maternal Uncle 51  . Heart attack Maternal Grandfather 57  . Prostate cancer Father   . Sleep apnea Father    Social History:  reports that he quit smoking about 13 years ago. His smoking use included Cigarettes. He has a 6 pack-year smoking history. He does not have any smokeless tobacco history on file. He reports that he drinks alcohol. He reports that he does not use illicit drugs.  Allergies: No Known Allergies  OUT PATIENT MEDS: Lopressor 12.5 BID Synthroid 88 mcg daily Testosterone  300 mg every 2-3 weeks  Vit c 1000 mg daily Asa 81 mg daily Vit D 400 u daily.   Results for orders placed or performed during the hospital encounter of 03/08/14 (from the past 48 hour(s))  CBC with Differential      Status: Abnormal   Collection Time: 03/08/14 10:35 AM  Result Value Ref Range   WBC 6.3 4.0 - 10.5 K/uL   RBC 4.71 4.22 - 5.81 MIL/uL   Hemoglobin 15.7 13.0 - 17.0 g/dL   HCT 47.5 39.0 - 52.0 %   MCV 100.8 (H) 78.0 - 100.0 fL   MCH 33.3 26.0 - 34.0 pg   MCHC 33.1 30.0 - 36.0 g/dL   RDW 12.4 11.5 - 15.5 %   Platelets 232 150 - 400 K/uL   Neutrophils Relative % 63 43 - 77 %   Neutro Abs 4.0 1.7 - 7.7 K/uL   Lymphocytes Relative 20 12 - 46 %   Lymphs Abs 1.3 0.7 - 4.0 K/uL   Monocytes Relative 13 (H) 3 - 12 %   Monocytes Absolute 0.8 0.1 - 1.0 K/uL   Eosinophils Relative 3 0 - 5 %   Eosinophils Absolute 0.2 0.0 - 0.7 K/uL   Basophils Relative 1 0 - 1 %   Basophils Absolute 0.0 0.0 - 0.1 K/uL  Basic metabolic panel     Status: Abnormal   Collection Time: 03/08/14 10:35 AM  Result Value Ref Range   Sodium 141 137 -  147 mEq/L   Potassium 4.7 3.7 - 5.3 mEq/L   Chloride 103 96 - 112 mEq/L   CO2 28 19 - 32 mEq/L   Glucose, Bld 102 (H) 70 - 99 mg/dL   BUN 12 6 - 23 mg/dL   Creatinine, Ser 1.00 0.50 - 1.35 mg/dL   Calcium 9.3 8.4 - 10.5 mg/dL   GFR calc non Af Amer 85 (L) >90 mL/min   GFR calc Af Amer >90 >90 mL/min    Comment: (NOTE) The eGFR has been calculated using the CKD EPI equation. This calculation has not been validated in all clinical situations. eGFR's persistently <90 mL/min signify possible Chronic Kidney Disease.    Anion gap 10 5 - 15  I-Stat Troponin, ED (not at [facility])     Status: None   Collection Time: 03/08/14 10:45 AM  Result Value Ref Range   Troponin i, poc 0.00 0.00 - 0.08 ng/mL   Comment 3            Comment: Due to the release kinetics of cTnI, a negative result within the first hours of the onset of symptoms does not rule out myocardial infarction with certainty. If myocardial infarction is still suspected, repeat the test at appropriate intervals.    Dg Chest 2 View  03/08/2014   CLINICAL DATA:  Chest pain.  EXAM: CHEST  2 VIEW  COMPARISON:   September 25, 2005.  FINDINGS: The heart size and mediastinal contours are within normal limits. Both lungs are clear. No pneumothorax or pleural effusion is noted. The visualized skeletal structures are unremarkable.  IMPRESSION: No acute cardiopulmonary abnormality seen.   Electronically Signed   By: Sabino Dick M.D.   On: 03/08/2014 12:47    ROS: General:no colds or fevers, no weight changes Skin:no rashes or ulcers HEENT:no blurred vision, no congestion CV:see HPI PUL:see HPI GI:no diarrhea constipation or melena, no indigestion GU:no hematuria, no dysuria MS:no joint pain, no claudication Neuro:no syncope, no lightheadedness Endo:no diabetes, + thyroid disease   Blood pressure 133/72, pulse 68, temperature 97.4 F (36.3 C), temperature source Oral, resp. rate 20, height 6' 1"  (1.854 m), weight 252 lb (114.306 kg), SpO2 98 %. PE: General:Pleasant affect, NAD- though continues with mild chest pain Skin:Warm and dry, brisk capillary refill HEENT:normocephalic, sclera clear, mucus membranes moist Neck:supple, no JVD, no bruits  Heart:S1S2 RRR without murmur, gallup, rub or click Lungs:clear without rales, rhonchi, or wheezes DGU:YQIH, non tender, + BS, do not palpate liver spleen or masses Ext:no lower ext edema, 2+ pedal pulses, 2+ radial pulses Neuro:alert and oriented, MAE, follows commands, + facial symmetry    Assessment/Plan Principal Problem:   Unstable angina- plan for cardiac cath to eval for CAD and PCI as needed. Serial troponins, check lipids. Active Problems:   OSA (obstructive sleep apnea)   Hypothyroid    KVQQVZ,DGLOV R Nurse Practitioner Certified [place] Pager [phone] or after 5pm or weekends call [phone] 03/08/2014, 2:06 PM   53 y/o MWM with no signif CRF now with 2 months of Sx c/w crescendo angina. The pain has awakened him from sleep. It radiates to LUE and jaw, and is associated with diaphoresis. Had worse CP today. Still has  residual chest pressure with LUE discomfort. Exam benign. Labs OK. Enz neg. EKG w/o acute changes (IRBBB). Feel best option is diagnostic radial cath to r/o an ischemic etiology. Explained risks and benefits. Pt hasn't eaten since early this AM.    Agree with note written  by Cecilie Kicks RNP Quay Burow J 03/08/2014 2:09 PM

## 2014-03-08 NOTE — Progress Notes (Signed)
Patient has home CPAP in his room that he places on/off himself.

## 2014-03-08 NOTE — ED Provider Notes (Signed)
Pt presents with persistent chest pain since yesterday.  Has been having intermittent symptoms for months.  Much worse today.  Decided to come to the ED.  Some radiation to jaw and nausea.  History of similar symptoms in the past.  Negative stress test then. Pt has seen pcp and is awaiting a cardiac consult. Physical Exam  BP 135/90 mmHg  Pulse 71  Temp(Src) 97.4 F (36.3 C) (Oral)  Resp 15  Ht 6\' 1"  (1.854 m)  Wt 252 lb (114.306 kg)  BMI 33.25 kg/m2  SpO2 98%  Physical Exam  Constitutional: He appears well-developed and well-nourished. No distress.  HENT:  Head: Normocephalic and atraumatic.  Right Ear: External ear normal.  Left Ear: External ear normal.  Eyes: Conjunctivae are normal. Right eye exhibits no discharge. Left eye exhibits no discharge. No scleral icterus.  Neck: Neck supple. No tracheal deviation present.  Cardiovascular: Normal rate.   Pulmonary/Chest: Effort normal. No stridor. No respiratory distress.  Musculoskeletal: He exhibits no edema.  Neurological: He is alert. Cranial nerve deficit: no gross deficits.  Skin: Skin is warm and dry. No rash noted.  Psychiatric: He has a normal mood and affect.  Nursing note and vitals reviewed.   ED Course  Procedures  EKG Interpretation  Date/Time:  Tuesday March 08 2014 10:23:17 EST Ventricular Rate:  75 PR Interval:  160 QRS Duration: 98 QT Interval:  362 QTC Calculation: 404 R Axis:   36 Text Interpretation:  Normal sinus rhythm RSR' or QR pattern in V1 suggests right ventricular conduction delay Borderline ECG No previous tracing Confirmed by Deshea Pooley  MD-J, Gatlyn Lipari ([id number]) on 03/08/2014 10:28:56 AM       MDM Symptoms concerning for angina although at this point he describes constant pain since yesterday.  Low risk per heart score.  Unlikely cardiac ischemia if troponin is negative.  Will check labs, consider cardiology consult.  I saw and evaluated the patient, reviewed the resident's note and I agree with  the findings and plan.        Dorie Rank, MD 03/08/14 [phone]

## 2014-03-08 NOTE — Progress Notes (Signed)
ANTICOAGULATION CONSULT NOTE - Initial Consult  Pharmacy Consult for heparin Indication: chest pain/ACS  No Known Allergies  Patient Measurements: Height: 6\' 1"  (185.4 cm) Weight: 252 lb (114.306 kg) IBW/kg (Calculated) : 79.9 Heparin Dosing Weight: 104.2 kg  Vital Signs: Temp: 97.4 F (36.3 C) (11/24 1031) Temp Source: Oral (11/24 1031) BP: 133/72 mmHg (11/24 1222) Pulse Rate: 68 (11/24 1245)  Labs:  Recent Labs  03/08/14 1035  HGB 15.7  HCT 47.5  PLT 232  CREATININE 1.00    Estimated Creatinine Clearance: 115.8 mL/min (by C-G formula based on Cr of 1).   Medical History: Past Medical History  Diagnosis Date  . Palpitations   . Hypothyroidism   . Hyperlipidemia     Mild  . Headache, cluster     Medications:  Scheduled:  . aspirin  324 mg Oral NOW   Or  . aspirin  300 mg Rectal NOW  . [START ON 03/09/2014] aspirin  81 mg Oral Pre-Cath  . [START ON 03/09/2014] aspirin EC  81 mg Oral Daily  . atorvastatin  80 mg Oral q1800  . nitroGLYCERIN  1 inch Topical 4 times per day  . sodium chloride  3 mL Intravenous Q12H    Assessment: 53 yo m who presented to the ED on 11/24 for chest pain.  Pharmacy is consulted to begin heparin for ACS/STEMI. Patient is not on [facility] PTA.  Hgb 15.7, plts 232, no bleeding noted.  Goal of Therapy:  Heparin level 0.3-0.7 units/ml Monitor platelets by anticoagulation protocol: Yes   Plan:  Heparin bolus 4,000 units x 1 Heparin infusion at 1350 units/hr 6-hr HL @ 2030 Daily HL and CBC Monitor hgb/plts, s/s of bleeding, clinical course  Cassie L. Nicole Kindred, PharmD Clinical Pharmacy Resident Pager: [phone] 03/08/2014 2:23 PM

## 2014-03-08 NOTE — CV Procedure (Signed)
      Cardiac Catheterization Operative Report  Jerry Keller [id number] 11/24/20154:19 PM Woody Seller, MD  Procedure Performed:  1. Left Heart Catheterization 2. Selective Coronary Angiography 3. Left ventricular angiogram  Operator: Lauree Chandler, MD  Arterial access site:  Right radial artery.   Indication: 53 yo male with several month history of chest discomfort presenting to ED today with worsened chest pain. Troponin negative.                                       Procedure Details: The risks, benefits, complications, treatment options, and expected outcomes were discussed with the patient. The patient and/or family concurred with the proposed plan, giving informed consent. The patient was brought to the cath lab after IV hydration was begun and oral premedication was given. The patient was further sedated with Versed and Fentany. The right wrist was assessed with a modified Allens test which was positive. The right wrist was prepped and draped in a sterile fashion. 1% lidocaine was used for local anesthesia. Using the modified Seldinger access technique, a 5 French sheath was placed in the right radial artery. 3 mg Verapamil was given through the sheath. 3000 units IV heparin was given. (he was on a heparin drip upon arrival to the cath lab). Standard diagnostic catheters were used to perform selective coronary angiography. A pigtail catheter was used to perform a left ventricular angiogram. The sheath was removed from the right radial artery and a Terumo hemostasis band was applied at the arteriotomy site on the right wrist.   There were no immediate complications. The patient was taken to the recovery area in stable condition.   Hemodynamic Findings: Central aortic pressure: 104/64 Left ventricular pressure: 115/5/12  Angiographic Findings:  Left main: No obstructive disease.   Left Anterior Descending Artery: Large caliber vessel that courses to the apex.  There is a 20% mid stenosis and 20% distal stenosis. The first diagonal branch is moderate in caliber with no obstructive disease.   Circumflex Artery:Large caliber vessel with termination into a large obtuse marginal branch. No obstructive disease.   Right Coronary Artery: Large dominant vessel with proximal 20% stenosis, serial 20% stenoses in the mid vessel.   Left Ventricular Angiogram: LVEF=55-60%  Impression: 1. Mild non-obstructive CAD 2. Normal LV systolic function 3. Non-cardiac chest pain  Recommendations: He will need treatment for his CAD with a statin. He may need formal GI evaluation for ongoing chest pain. I will change his Protonix to 40 mg po BID. GI cocktail tonight. Would consult GI in the am.        Complications:  None. The patient tolerated the procedure well.

## 2014-03-08 NOTE — ED Notes (Signed)
Patient transported to X-ray 

## 2014-03-09 ENCOUNTER — Encounter (HOSPITAL_COMMUNITY): Payer: Self-pay | Admitting: Nurse Practitioner

## 2014-03-09 DIAGNOSIS — G4733 Obstructive sleep apnea (adult) (pediatric): Secondary | ICD-10-CM | POA: Diagnosis not present

## 2014-03-09 DIAGNOSIS — I2510 Atherosclerotic heart disease of native coronary artery without angina pectoris: Secondary | ICD-10-CM | POA: Diagnosis not present

## 2014-03-09 DIAGNOSIS — I4510 Unspecified right bundle-branch block: Secondary | ICD-10-CM | POA: Diagnosis not present

## 2014-03-09 DIAGNOSIS — R072 Precordial pain: Secondary | ICD-10-CM

## 2014-03-09 DIAGNOSIS — R0789 Other chest pain: Secondary | ICD-10-CM

## 2014-03-09 DIAGNOSIS — E039 Hypothyroidism, unspecified: Secondary | ICD-10-CM | POA: Diagnosis not present

## 2014-03-09 LAB — LIPID PANEL
Cholesterol: 147 mg/dL (ref 0–200)
HDL: 26 mg/dL — AB (ref >39)
LDL Cholesterol: 81 mg/dL (ref 0–99)
Total CHOL/HDL Ratio: 5.7 RATIO
Triglycerides: 202 mg/dL — ABNORMAL HIGH (ref <150)
VLDL: 40 mg/dL (ref 0–40)

## 2014-03-09 LAB — BASIC METABOLIC PANEL
Anion gap: 10 (ref 5–15)
BUN: 13 mg/dL (ref 6–23)
CO2: 28 mEq/L (ref 19–32)
Calcium: 8.8 mg/dL (ref 8.4–10.5)
Chloride: 100 mEq/L (ref 96–112)
Creatinine, Ser: 1.09 mg/dL (ref 0.50–1.35)
GFR calc Af Amer: 89 mL/min — ABNORMAL LOW (ref >90)
GFR, EST NON AFRICAN AMERICAN: 77 mL/min — AB (ref >90)
GLUCOSE: 103 mg/dL — AB (ref 70–99)
POTASSIUM: 4.5 meq/L (ref 3.7–5.3)
Sodium: 138 mEq/L (ref 137–147)

## 2014-03-09 LAB — CBC
HCT: 44.8 % (ref 39.0–52.0)
HEMOGLOBIN: 14.4 g/dL (ref 13.0–17.0)
MCH: 32.1 pg (ref 26.0–34.0)
MCHC: 32.1 g/dL (ref 30.0–36.0)
MCV: 100 fL (ref 78.0–100.0)
Platelets: 218 10*3/uL (ref 150–400)
RBC: 4.48 MIL/uL (ref 4.22–5.81)
RDW: 12.5 % (ref 11.5–15.5)
WBC: 7.6 10*3/uL (ref 4.0–10.5)

## 2014-03-09 LAB — HEPATIC FUNCTION PANEL
ALK PHOS: 61 U/L (ref 39–117)
ALT: 52 U/L (ref 0–53)
AST: 29 U/L (ref 0–37)
Albumin: 3.6 g/dL (ref 3.5–5.2)
Total Bilirubin: 0.4 mg/dL (ref 0.3–1.2)
Total Protein: 6.3 g/dL (ref 6.0–8.3)

## 2014-03-09 LAB — T4, FREE: FREE T4: 0.85 ng/dL (ref 0.80–1.80)

## 2014-03-09 LAB — HEMOGLOBIN A1C
HEMOGLOBIN A1C: 5.6 % (ref <5.7)
Mean Plasma Glucose: 114 mg/dL (ref <117)

## 2014-03-09 LAB — TROPONIN I

## 2014-03-09 MED ORDER — ATORVASTATIN CALCIUM 10 MG PO TABS: 10.0000 mg | ORAL_TABLET | Freq: Every day | ORAL | Status: DC

## 2014-03-09 MED ORDER — PANTOPRAZOLE SODIUM 40 MG PO TBEC: 40.0000 mg | DELAYED_RELEASE_TABLET | Freq: Every day | ORAL | Status: DC

## 2014-03-09 NOTE — Progress Notes (Signed)
Patient Profile: Jerry Keller is a 53 y.o. male with no prior history of CAD admitted 03-08-14 with chest pain concerning for [place].  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  SUBJECTIVE: The patient is doing well today.  At this time, he denies chest pain, shortness of breath, or any new concerns.  He is anxious to go home.   CURRENT MEDICATIONS: . aspirin  81 mg Oral Pre-Cath  . [START ON 03/10/2014] aspirin EC  81 mg Oral Daily  . atorvastatin  80 mg Oral q1800  . cholecalciferol  400 Units Oral Daily  . folic acid  0.5 mg Oral Daily  . levothyroxine  88 mcg Oral QAC breakfast  . magnesium oxide  200 mg Oral Daily  . metoprolol tartrate  12.5 mg Oral BID  . pantoprazole  40 mg Oral BID  . sodium chloride  3 mL Intravenous Q12H  . cyanocobalamin  2,000 mcg Oral Daily  . vitamin C  1,000 mg Oral Daily      OBJECTIVE: Physical Exam: Filed Vitals:   03/08/14 2045 03/08/14 2048 03/08/14 2055 03/08/14 2220  BP: 93/74  112/90 121/62  Pulse: 80 72 76 70  Temp:      TempSrc:      Resp: 26 14 20 14   Height:      Weight:      SpO2: 97% 98% 97% 97%    Intake/Output Summary (Last 24 hours) at 03/09/14 0947 Last data filed at 03/09/14 0900  Gross per 24 hour  Intake   1264 ml  Output      0 ml  Net   1264 ml    Telemetry reveals sinus rhythm ECG: sb, 58, inc rbbb, no acute st/t changes.  GEN- The patient is well appearing, alert and oriented x 3 today.   Head- normocephalic, atraumatic Eyes-  Sclera clear, conjunctiva pink Ears- hearing intact Oropharynx- clear Neck- supple Lungs- Clear to ausculation bilaterally, normal work of breathing Heart- Regular rate and rhythm, no murmurs, rubs or gallops  GI- soft, NT, ND, + BS Extremities- no clubbing, cyanosis, or edema, 2+ radial pulses. R wrist cath site w/o bleeding/bruit/hematoma. Skin- no rash or lesion Psych- euthymic mood, full affect Neuro- strength and sensation are intact  LABS: Basic  Metabolic Panel:  Recent Labs  03/08/14 1035 03/08/14 1455 03/09/14 0301  NA 141  --  138  K 4.7  --  4.5  CL 103  --  100  CO2 28  --  28  GLUCOSE 102*  --  103*  BUN 12  --  13  CREATININE 1.00  --  1.09  CALCIUM 9.3  --  8.8  MG  --  2.2  --    Liver Function Tests:  Recent Labs  03/09/14 0301  AST 29  ALT 52  ALKPHOS 61  BILITOT 0.4  PROT 6.3  ALBUMIN 3.6  CBC:  Recent Labs  03/08/14 1035 03/09/14 0301  WBC 6.3 7.6  NEUTROABS 4.0  --   HGB 15.7 14.4  HCT 47.5 44.8  MCV 100.8* 100.0  PLT 232 218   Cardiac Enzymes:  Recent Labs  03/08/14 1455 03/08/14 2215 03/09/14 0301  TROPONINI <0.30 <0.30 <0.30   Hemoglobin A1C:  Recent Labs  03/08/14 1455  HGBA1C 5.6   Fasting Lipid Panel:  Recent Labs  03/09/14 0301  CHOL 147  HDL 26*  LDLCALC 81  TRIG 202*  CHOLHDL 5.7   Thyroid Function Tests:  Recent Labs  03/08/14 1449  TSH 1.870    RADIOLOGY: Dg Chest 2 View 03/08/2014   CLINICAL DATA:  Chest pain.  EXAM: CHEST  2 VIEW  COMPARISON:  September 25, 2005.  FINDINGS: The heart size and mediastinal contours are within normal limits. Both lungs are clear. No pneumothorax or pleural effusion is noted. The visualized skeletal structures are unremarkable.  IMPRESSION: No acute cardiopulmonary abnormality seen.   Electronically Signed   By: Sabino Dick M.D.   On: 03/08/2014 12:47    ASSESSMENT AND PLAN:  Principal Problem:   Midsternal chest pain Active Problems:   OSA (obstructive sleep apnea)   Hypothyroid   Morbid obesity  1.  Midsternal chest pain S/p catheterization 03-08-14 which demonstrated mild non-obstructive CAD with normal LV function No chest pain or shortness of breath this morning Continue low dose statin, aspirin Recently started on lopressor 2/2 c/p with concern for angina.  No h/o htn.  No further indication for bb - d/c. Continue Protonix at discharge, consider outpatient GI eval if pain recurs.  2.  Sleep  apnea Compliant with CPAP  3.  Hypothyroidism TSH therapeutic on Levothyroxine Continue current dose  4.  Lipids:  LDL only 81 however in light of finding of nonobs CAD, will plan to continue low dose statin therapy.  F/U lipids/lft's in 6-8 wks.  5.  Morbid Obesity:  Discussed exercise and dieting going forward. Pt and wife are committed to significant dietary and lifestyle changes in an effort to lose weight and come off of statin. Patient is stable for discharge to home today.  Follow up with APP 3-4 weeks.    Chanetta Marshall, NP-S Murray Hodgkins, NP 03/09/2014 9:50 AM   Attending Note:   The patient left before he was seen by the attending.  Thayer Headings, Brooke Bonito., MD, [facility] 03/11/2014, 1:47 PM [address],  [place] Pager [phone]

## 2014-03-09 NOTE — Discharge Instructions (Signed)

## 2014-03-09 NOTE — Discharge Summary (Signed)
Discharge Summary   Patient ID: Jerry Keller,  MRN: [id number], DOB/AGE: October 10, 1960 53 y.o.  Admit date: 03/08/2014 Discharge date: 03/09/2014  Primary Care Provider: Woody Keller Primary Cardiologist: Jerry Fridge, MD   Discharge Diagnoses Principal Problem:   Midsternal chest pain  **s/p catheterization this admission revealing nonobstructive CAD.  Active Problems:   OSA (obstructive sleep apnea)   Hypothyroid   Morbid obesity  Allergies No Known Allergies  Procedures  Cardiac Catheterization 11.24.2015  Hemodynamic Findings: Central aortic pressure: 104/64 Left ventricular pressure: 115/5/12  Angiographic Findings:  Left main: No obstructive disease.   Left Anterior Descending Artery: Large caliber vessel that courses to the apex. There is a 20% mid stenosis and 20% distal stenosis. The first diagonal branch is moderate in caliber with no obstructive disease.   Circumflex Artery:Large caliber vessel with termination into a large obtuse marginal branch. No obstructive disease.   Right Coronary Artery: Large dominant vessel with proximal 20% stenosis, serial 20% stenoses in the mid vessel.  Left Ventricular Angiogram: LVEF=55-60%  Impression: 1. Mild non-obstructive CAD 2. Normal LV systolic function 3. Non-cardiac chest pain _____________   History of Present Illness  53 year old male without prior cardiac history. Over a two-month period prior to admission, he has been experiencing intermittent rest and exertional midsternal chest discomfort. He had recurrent symptoms on the night prior to admission prompting him to present to the ED. There, ECG was nonacute and troponin was normal. He was admitted for further evaluation.  Hospital Course  Patient ruled out for myocardial infarction. He underwent diagnostic cardiac catheterization revealing mild, nonobstructive coronary artery disease with normal LV function. He has been placed on low-dose aspirin and statin  therapy and postprocedure has been ambulating without recurrent symptoms or limitations. He has been counseled on the importance of lifestyle and diet modification and he and his wife are committed to beginning an exercise and diet regimen. He will be discharged home today in good condition with follow-up arranged within the next 2 weeks.  Discharge Vitals Blood pressure 121/62, pulse 70, temperature 97.8 F (36.6 C), temperature source Oral, resp. rate 14, height 6\' 1"  (1.854 m), weight 252 lb (114.306 kg), SpO2 97 %.  Filed Weights   03/08/14 1031  Weight: 252 lb (114.306 kg)    Labs  CBC  Recent Labs  03/08/14 1035 03/09/14 0301  WBC 6.3 7.6  NEUTROABS 4.0  --   HGB 15.7 14.4  HCT 47.5 44.8  MCV 100.8* 100.0  PLT 232 301   Basic Metabolic Panel  Recent Labs  03/08/14 1035 03/08/14 1455 03/09/14 0301  NA 141  --  138  K 4.7  --  4.5  CL 103  --  100  CO2 28  --  28  GLUCOSE 102*  --  103*  BUN 12  --  13  CREATININE 1.00  --  1.09  CALCIUM 9.3  --  8.8  MG  --  2.2  --    Liver Function Tests  Recent Labs  03/09/14 0301  AST 29  ALT 52  ALKPHOS 61  BILITOT 0.4  PROT 6.3  ALBUMIN 3.6   Cardiac Enzymes  Recent Labs  03/08/14 1455 03/08/14 2215 03/09/14 0301  TROPONINI <0.30 <0.30 <0.30   Hemoglobin A1C  Recent Labs  03/08/14 1455  HGBA1C 5.6   Fasting Lipid Panel  Recent Labs  03/09/14 0301  CHOL 147  HDL 26*  LDLCALC 81  TRIG 202*  CHOLHDL 5.7   Thyroid Function Tests  Recent Labs  03/08/14 1449  TSH 1.870    Disposition  Pt is being discharged home today in good condition.  Follow-up Plans & Appointments  Follow-up Information    Follow up with [facility], NP On 03/25/2014.   Specialty:  Cardiology   Why:  11:45 - Dr. Kennon Holter PA   Contact information:   [address] [place] [address] [phone]       Follow up with Jerry Seller, MD.   Specialty:  [facility] Medicine   Contact  information:   [address] [place] [address] [phone]       Discharge Medications    Medication List    STOP taking these medications        metoprolol tartrate 25 MG tablet  Commonly known as:  LOPRESSOR      TAKE these medications        aspirin EC 81 MG tablet  Take 81 mg by mouth daily.     atorvastatin 10 MG tablet  Commonly known as:  LIPITOR  Take 1 tablet (10 mg total) by mouth daily at 6 PM.     cholecalciferol 400 UNITS Tabs tablet  Commonly known as:  VITAMIN D  Take 400 Units by mouth daily.     cyanocobalamin 2000 MCG tablet  Take 2,000 mcg by mouth daily. Once a week     folic acid 270 MCG tablet  Commonly known as:  FOLVITE  Take 400 mcg by mouth daily.     HYDROcodone-acetaminophen 5-325 MG per tablet  Commonly known as:  NORCO/VICODIN  Take 0.5-1 tablets by mouth every 6 (six) hours as needed for moderate pain.     levothyroxine 88 MCG tablet  Commonly known as:  SYNTHROID, LEVOTHROID  Take 88 mcg by mouth daily before breakfast.     Magnesium 250 MG Tabs  Take 250 mg by mouth daily.     pantoprazole 40 MG tablet  Commonly known as:  PROTONIX  Take 1 tablet (40 mg total) by mouth daily.     testosterone cypionate 200 MG/ML injection  Commonly known as:  DEPOTESTOTERONE CYPIONATE  Inject 300 mg into the muscle. Every 2-3 weeks     VITAMIN B-6 PO  Take 100 mg by mouth daily.     vitamin C 500 MG tablet  Commonly known as:  ASCORBIC ACID  Take 1,000 mg by mouth daily.        Outstanding Labs/Studies  Follow-up lipids and LFTs in 6-8 weeks.  Duration of Discharge Encounter   Greater than 30 minutes including physician time.  Signed, Jerry Hodgkins NP 03/09/2014, 9:59 AM   Attending Note:   The patient left before being seen .    Jerry Keller, Jerry Keller., MD, [facility] 03/11/2014, 1:47 PM [address],  [place] Pager [phone]

## 2014-03-24 ENCOUNTER — Encounter (HOSPITAL_COMMUNITY): Payer: Self-pay | Admitting: Cardiovascular Disease

## 2014-03-25 ENCOUNTER — Ambulatory Visit (INDEPENDENT_AMBULATORY_CARE_PROVIDER_SITE_OTHER): Payer: 59 | Admitting: Cardiology

## 2014-03-25 ENCOUNTER — Encounter: Payer: Self-pay | Admitting: Cardiology

## 2014-03-25 ENCOUNTER — Ambulatory Visit (HOSPITAL_COMMUNITY)
Admission: RE | Admit: 2014-03-25 | Discharge: 2014-03-25 | Disposition: A | Discharge status: Home / Self Care | Payer: 59 | Source: Ambulatory Visit | Attending: Cardiology | Admitting: Cardiology

## 2014-03-25 VITALS — BP 130/84 | HR 74 | Ht 73.0 in | Wt 253.3 lb

## 2014-03-25 DIAGNOSIS — M79662 Pain in left lower leg: Secondary | ICD-10-CM | POA: Insufficient documentation

## 2014-03-25 DIAGNOSIS — R0789 Other chest pain: Secondary | ICD-10-CM

## 2014-03-25 DIAGNOSIS — I2510 Atherosclerotic heart disease of native coronary artery without angina pectoris: Secondary | ICD-10-CM

## 2014-03-25 DIAGNOSIS — G4733 Obstructive sleep apnea (adult) (pediatric): Secondary | ICD-10-CM

## 2014-03-25 NOTE — Assessment & Plan Note (Signed)
Briefly discussed diet and exercise

## 2014-03-25 NOTE — Assessment & Plan Note (Addendum)
Still with episodes associated with stress.  He is trying to relax but has a demanding job.  Cath with non obstructive disease.  GI consult was suggested. But GI cocktail without relief he is on protonix but only taking daily.  Pt does not believe is GI.

## 2014-03-25 NOTE — Assessment & Plan Note (Signed)
Stable

## 2014-03-25 NOTE — Progress Notes (Signed)
03/25/2014   PCP: Woody Seller, MD   Chief Complaint  Patient presents with  . Chest Pain    with exertion and stress  . Shortness of Breath    with exertion and stress  . Leg Pain    left calf feels like "softball or baseball within" for past 2 days  . Medication Problem    did not start lipitor; would like to try diet/exercise first    Primary Cardiologist:Dr. Adora Fridge (tenative only saw in the hospital)  HPI:  53 year old male without prior cardiac history. Over a two-month period prior to admission, he has been experiencing intermittent rest and exertional midsternal chest discomfort. He had recurrent symptoms on the night prior to admission prompting him to present to the ED. There, ECG was nonacute and troponin was normal. He was admitted and had cath with mild non obstructive disease.  Lifestyle modification was reviewed.  Presents today for follow up.  Still with episodic pain- with stress.  He does complain of lt calf pain, severe enough that he is climbing steps differently.  No redness at site but this is new.    He did not begin statin will wait to see what diet and exercise due for his number.  Dr. Redmond Pulling to  [place].     No Known Allergies  Current Outpatient Prescriptions  Medication Sig Dispense Refill  . ALPRAZolam (XANAX) 0.5 MG tablet Take 1 tablet by mouth every morning.  1  . aspirin EC 81 MG tablet Take 81 mg by mouth daily.    . cholecalciferol (VITAMIN D) 400 UNITS TABS Take 400 Units by mouth daily.     . cyanocobalamin 2000 MCG tablet Take 2,000 mcg by mouth daily. Once a week    . folic acid (FOLVITE) 161 MCG tablet Take 400 mcg by mouth daily.     Marland Kitchen HYDROcodone-acetaminophen (NORCO/VICODIN) 5-325 MG per tablet Take 0.5-1 tablets by mouth every 6 (six) hours as needed for moderate pain.    Marland Kitchen levothyroxine (SYNTHROID, LEVOTHROID) 88 MCG tablet Take 88 mcg by mouth daily before breakfast.     . Magnesium 250 MG TABS Take 250 mg by  mouth daily.     . metoprolol tartrate (LOPRESSOR) 25 MG tablet Take 25 mg by mouth daily.    . pantoprazole (PROTONIX) 40 MG tablet Take 1 tablet (40 mg total) by mouth daily. 30 tablet 2  . Pyridoxine HCl (VITAMIN B-6 PO) Take 100 mg by mouth daily.     Marland Kitchen testosterone cypionate (DEPOTESTOTERONE CYPIONATE) 200 MG/ML injection Inject 300 mg into the muscle. Every 2-3 weeks    . vitamin C (ASCORBIC ACID) 500 MG tablet Take 1,000 mg by mouth daily.      No current facility-administered medications for this visit.    Past Medical History  Diagnosis Date  . Palpitations   . Hypothyroidism   . Hyperlipidemia     a. Mild - statin started 02/2014.  Marland Kitchen Headache, cluster   . GERD (gastroesophageal reflux disease)   . Sleep apnea   . Non-obstructive CAD     a. 02/2014 Cath: LM nl, LAD 59m/d, LCX nl, RCA 20p/m, EF 55-60%-->Med Rx.    Past Surgical History  Procedure Laterality Date  . Tonsillectomy and adenoidectomy      as a child  . Colonoscopy N/A 07/30/2013    Procedure: COLONOSCOPY;  Surgeon: Beryle Beams, MD;  Location: [facility] ENDOSCOPY;  Service: Endoscopy;  Laterality: N/A;  .  Left heart catheterization with coronary angiogram N/A 03/08/2014    Procedure: LEFT HEART CATHETERIZATION WITH CORONARY ANGIOGRAM;  Surgeon: Burnell Blanks, MD;  Location: [facility] CATH LAB;  Service: Cardiovascular;  Laterality: N/A;    YTK:[id number]:AT colds or fevers, no weight changes Skin:no rashes or ulcers HEENT:no blurred vision, no congestion CV:see HPI PUL:see HPI GI:no diarrhea constipation or melena, no indigestion GU:no hematuria, no dysuria MS:no joint pain, no claudication- see HPI Neuro:no syncope, no lightheadedness Endo:no diabetes, no thyroid disease  Wt Readings from Last 3 Encounters:  03/25/14 253 lb 4.8 oz (114.896 kg)  03/08/14 252 lb (114.306 kg)  08/31/12 244 lb (110.678 kg)    PHYSICAL EXAM BP 130/84 mmHg  Pulse 74  Ht 6\' 1"  (1.854 m)  Wt 253 lb 4.8 oz (114.896 kg)   BMI 33.43 kg/m2 General:Pleasant affect, NAD Skin:Warm and dry, brisk capillary refill HEENT:normocephalic, sclera clear, mucus membranes moist Neck:supple, no JVD, no bruits  Heart:S1S2 RRR without murmur, gallup, rub or click Lungs:clear without rales, rhonchi, or wheezes FTD:DUKG, non tender, + BS, do not palpate liver spleen or masses Ext:no lower ext edema, 2+ pedal pulses, 2+ radial pulses, no tenderness to Lt calf, no redness. Just a deep pain in let calf. Neuro:alert and oriented X 3, MAE, follows commands, + facial symmetry   ASSESSMENT AND PLAN Chest pain Still with episodes associated with stress.  He is trying to relax but has a demanding job.  Cath with non obstructive disease.  GI consult was suggested. But GI cocktail without relief he is on protonix but only taking daily.  Pt does not believe is GI.    OSA (obstructive sleep apnea) Stable   CAD in native artery Mild non obstructive, 20% in RCA and LAD.  Recommended statin but pt and wife prefer to try diet and exercise first.    Morbid obesity Briefly discussed diet and exercise   Pain of left calf Increased pain in lt calf, no redness, I ordered venous doppler which was negative for DVT, recommended follow up with PCP

## 2014-03-25 NOTE — Patient Instructions (Signed)
Lower extremity venous doppler (Left Leg, now). This test is an ultrasound of the veins in the legs. It looks at venous blood flow that carries blood from the heart to the legs or arms. Allow one hour for a Lower Venous exam. There are no restrictions or special instructions.  Please follow up as needed.

## 2014-03-25 NOTE — Assessment & Plan Note (Signed)
Increased pain in lt calf, no redness, I ordered venous doppler which was negative for DVT, recommended follow up with PCP

## 2014-03-25 NOTE — Progress Notes (Signed)
Left Lower Extremity Venous Duplex Completed. °Brianna L Mazza,RVT °

## 2014-03-25 NOTE — Assessment & Plan Note (Signed)
Mild non obstructive, 20% in RCA and LAD.  Recommended statin but pt and wife prefer to try diet and exercise first.

## 2014-04-22 ENCOUNTER — Ambulatory Visit: Payer: 59 | Admitting: Cardiology

## 2014-09-06 ENCOUNTER — Encounter (HOSPITAL_BASED_OUTPATIENT_CLINIC_OR_DEPARTMENT_OTHER): Payer: Self-pay | Admitting: *Deleted

## 2014-09-06 ENCOUNTER — Emergency Department (HOSPITAL_BASED_OUTPATIENT_CLINIC_OR_DEPARTMENT_OTHER)
Admission: EM | Admit: 2014-09-06 | Discharge: 2014-09-06 | Disposition: A | Discharge status: Home / Self Care | Payer: BLUE CROSS/BLUE SHIELD | Attending: Emergency Medicine | Admitting: Emergency Medicine

## 2014-09-06 ENCOUNTER — Emergency Department (HOSPITAL_BASED_OUTPATIENT_CLINIC_OR_DEPARTMENT_OTHER): Payer: BLUE CROSS/BLUE SHIELD

## 2014-09-06 DIAGNOSIS — Z9889 Other specified postprocedural states: Secondary | ICD-10-CM | POA: Diagnosis not present

## 2014-09-06 DIAGNOSIS — Z79899 Other long term (current) drug therapy: Secondary | ICD-10-CM | POA: Diagnosis not present

## 2014-09-06 DIAGNOSIS — Z7982 Long term (current) use of aspirin: Secondary | ICD-10-CM | POA: Diagnosis not present

## 2014-09-06 DIAGNOSIS — K219 Gastro-esophageal reflux disease without esophagitis: Secondary | ICD-10-CM | POA: Insufficient documentation

## 2014-09-06 DIAGNOSIS — N201 Calculus of ureter: Secondary | ICD-10-CM | POA: Diagnosis not present

## 2014-09-06 DIAGNOSIS — R109 Unspecified abdominal pain: Secondary | ICD-10-CM

## 2014-09-06 DIAGNOSIS — Z8669 Personal history of other diseases of the nervous system and sense organs: Secondary | ICD-10-CM | POA: Insufficient documentation

## 2014-09-06 DIAGNOSIS — Z87891 Personal history of nicotine dependence: Secondary | ICD-10-CM | POA: Insufficient documentation

## 2014-09-06 DIAGNOSIS — E039 Hypothyroidism, unspecified: Secondary | ICD-10-CM | POA: Insufficient documentation

## 2014-09-06 DIAGNOSIS — R1032 Left lower quadrant pain: Secondary | ICD-10-CM | POA: Diagnosis present

## 2014-09-06 DIAGNOSIS — N23 Unspecified renal colic: Secondary | ICD-10-CM

## 2014-09-06 DIAGNOSIS — I2510 Atherosclerotic heart disease of native coronary artery without angina pectoris: Secondary | ICD-10-CM | POA: Insufficient documentation

## 2014-09-06 LAB — URINALYSIS, ROUTINE W REFLEX MICROSCOPIC
Bilirubin Urine: NEGATIVE
GLUCOSE, UA: NEGATIVE mg/dL
KETONES UR: 15 mg/dL — AB
LEUKOCYTES UA: NEGATIVE
NITRITE: NEGATIVE
PH: 5.5 (ref 5.0–8.0)
Protein, ur: NEGATIVE mg/dL
SPECIFIC GRAVITY, URINE: 1.03 (ref 1.005–1.030)
Urobilinogen, UA: 0.2 mg/dL (ref 0.0–1.0)

## 2014-09-06 LAB — URINE MICROSCOPIC-ADD ON

## 2014-09-06 MED ORDER — ONDANSETRON HCL 4 MG/2ML IJ SOLN
4.0000 mg | Freq: Once | INTRAMUSCULAR | Status: AC
  Administered 2014-09-06: 4 mg via INTRAVENOUS
  Filled 2014-09-06: qty 2

## 2014-09-06 MED ORDER — KETOROLAC TROMETHAMINE 15 MG/ML IJ SOLN
15.0000 mg | Freq: Once | INTRAMUSCULAR | Status: AC
  Administered 2014-09-06: 15 mg via INTRAVENOUS
  Filled 2014-09-06: qty 1

## 2014-09-06 MED ORDER — HYDROMORPHONE HCL 1 MG/ML IJ SOLN
1.0000 mg | Freq: Once | INTRAMUSCULAR | Status: AC
  Administered 2014-09-06: 1 mg via INTRAVENOUS
  Filled 2014-09-06: qty 1

## 2014-09-06 NOTE — ED Provider Notes (Signed)
CSN: [id number]     Arrival date & time 09/06/14  0235 History   First MD Initiated Contact with Patient 09/06/14 [id number]     Chief Complaint  Patient presents with  . Flank Pain     (Consider location/radiation/quality/duration/timing/severity/associated sxs/prior Treatment) HPI  This is a 54 year old male who was awakened from sleep about 2 AM with severe, sharp, stabbing pain in his left flank. The pain radiated to the left lower quadrant of his abdomen. The pain was not significantly changed with position. There was associated nausea but no vomiting. He denies dysuria or hematuria. He voided after arrival with significant improvement in the pain. He now describes the pain as mild.  Past Medical History  Diagnosis Date  . Palpitations   . Hypothyroidism   . Hyperlipidemia     a. Mild - statin started 02/2014.  Marland Kitchen Headache, cluster   . GERD (gastroesophageal reflux disease)   . Sleep apnea   . Non-obstructive CAD     a. 02/2014 Cath: LM nl, LAD 85m/d, LCX nl, RCA 20p/m, EF 55-60%-->Med Rx.   Past Surgical History  Procedure Laterality Date  . Tonsillectomy and adenoidectomy      as a child  . Colonoscopy N/A 07/30/2013    Procedure: COLONOSCOPY;  Surgeon: Beryle Beams, MD;  Location: [facility];  Service: Endoscopy;  Laterality: N/A;  . Left heart catheterization with coronary angiogram N/A 03/08/2014    Procedure: LEFT HEART CATHETERIZATION WITH CORONARY ANGIOGRAM;  Surgeon: Burnell Blanks, MD;  Location: [facility] CATH LAB;  Service: Cardiovascular;  Laterality: N/A;   Family History  Problem Relation Age of Onset  . Hypertension Mother   . Hyperlipidemia Father   . Hypertension Father   . Heart attack Maternal Uncle 51  . Heart attack Maternal Grandfather 57  . Prostate cancer Father   . Sleep apnea Father    History  Substance Use Topics  . Smoking status: Former Smoker -- 1.00 packs/day for 6 years    Types: Cigarettes    Quit date: 04/15/2000  . Smokeless  tobacco: Never Used  . Alcohol Use: Yes     Comment: socially    Review of Systems  All other systems reviewed and are negative.   Allergies  Review of patient's allergies indicates no known allergies.  Home Medications   Prior to Admission medications   Medication Sig Start Date End Date Taking? Authorizing Provider  ALPRAZolam Duanne Moron) 0.5 MG tablet Take 1 tablet by mouth every morning. 03/12/14   Historical Provider, MD  aspirin EC 81 MG tablet Take 81 mg by mouth daily.    Historical Provider, MD  cholecalciferol (VITAMIN D) 400 UNITS TABS Take 400 Units by mouth daily.     Historical Provider, MD  cyanocobalamin 2000 MCG tablet Take 2,000 mcg by mouth daily. Once a week    Historical Provider, MD  folic acid (FOLVITE) 299 MCG tablet Take 400 mcg by mouth daily.     Historical Provider, MD  HYDROcodone-acetaminophen (NORCO/VICODIN) 5-325 MG per tablet Take 0.5-1 tablets by mouth every 6 (six) hours as needed for moderate pain.    Historical Provider, MD  levothyroxine (SYNTHROID, LEVOTHROID) 88 MCG tablet Take 88 mcg by mouth daily before breakfast.     Historical Provider, MD  Magnesium 250 MG TABS Take 250 mg by mouth daily.     Historical Provider, MD  metoprolol tartrate (LOPRESSOR) 25 MG tablet Take 25 mg by mouth daily. 03/03/14   Historical Provider, MD  pantoprazole (  PROTONIX) 40 MG tablet Take 1 tablet (40 mg total) by mouth daily. 03/09/14   Rogelia Mire, NP  Pyridoxine HCl (VITAMIN B-6 PO) Take 100 mg by mouth daily.     Historical Provider, MD  testosterone cypionate (DEPOTESTOTERONE CYPIONATE) 200 MG/ML injection Inject 300 mg into the muscle. Every 2-3 weeks    Historical Provider, MD  vitamin C (ASCORBIC ACID) 500 MG tablet Take 1,000 mg by mouth daily.     Historical Provider, MD   BP 154/79 mmHg  Pulse 70  Temp(Src) 97.7 F (36.5 C) (Oral)  Resp 22  Ht 6\' 1"  (1.854 m)  Wt 250 lb (113.399 kg)  BMI 32.99 kg/m2  SpO2 100%   Physical Exam  General:  Well-developed, well-nourished male in no acute distress; appearance consistent with age of record HENT: normocephalic; atraumatic Eyes: pupils equal, round and reactive to light; extraocular muscles intact Neck: supple Heart: regular rate and rhythm Lungs: clear to auscultation bilaterally Abdomen: soft; nondistended; nontender; no masses or hepatosplenomegaly; bowel sounds present GU: Mild left CVA tenderness Extremities: No deformity; full range of motion; pulses normal Neurologic: Awake, alert and oriented; motor function intact in all extremities and symmetric; no facial droop Skin: Warm and dry Psychiatric: Normal mood and affect    ED Course  Procedures (including critical care time)   MDM   Nursing notes and vitals signs, including pulse oximetry, reviewed.  Summary of this visit's results, reviewed by myself:  Labs:  Results for orders placed or performed during the hospital encounter of 09/06/14 (from the past 24 hour(s))  Urinalysis, Routine w reflex microscopic     Status: Abnormal   Collection Time: 09/06/14  2:42 AM  Result Value Ref Range   Color, Urine YELLOW YELLOW   APPearance CLEAR CLEAR   Specific Gravity, Urine 1.030 1.005 - 1.030   pH 5.5 5.0 - 8.0   Glucose, UA NEGATIVE NEGATIVE mg/dL   Hgb urine dipstick LARGE (A) NEGATIVE   Bilirubin Urine NEGATIVE NEGATIVE   Ketones, ur 15 (A) NEGATIVE mg/dL   Protein, ur NEGATIVE NEGATIVE mg/dL   Urobilinogen, UA 0.2 0.0 - 1.0 mg/dL   Nitrite NEGATIVE NEGATIVE   Leukocytes, UA NEGATIVE NEGATIVE  Urine microscopic-add on     Status: Abnormal   Collection Time: 09/06/14  2:42 AM  Result Value Ref Range   Squamous Epithelial / LPF RARE RARE   WBC, UA 0-2 <3 WBC/hpf   RBC / HPF 21-50 <3 RBC/hpf   Bacteria, UA FEW (A) RARE   Urine-Other MUCOUS PRESENT     Imaging Studies: Ct Renal Stone Study  09/06/2014   CLINICAL DATA:  RIGHT flank pain for 90 minutes.  EXAM: CT ABDOMEN AND PELVIS WITHOUT CONTRAST   TECHNIQUE: Multidetector CT imaging of the abdomen and pelvis was performed following the standard protocol without IV contrast.  COMPARISON:  MRI of the lumbar spine June 12, 2010  FINDINGS: LUNG BASES: Included view of the lung bases demonstrate lingular faint ground-glass opacity which may reflect atelectasis. The visualized heart and pericardium are unremarkable.  KIDNEYS/BLADDER: Kidneys are orthotopic, demonstrating normal size and morphology. No nephrolithiasis, hydronephrosis; limited assessment for renal masses on this nonenhanced examination. The unopacified ureters are normal in course and caliber. Urinary bladder is partially distended and unremarkable.  SOLID ORGANS: The liver, spleen, gallbladder, pancreas and adrenal glands are unremarkable for this non-contrast examination.  GASTROINTESTINAL TRACT: The stomach, small and large bowel are normal in course and caliber without inflammatory changes, the sensitivity may  be decreased by lack of enteric contrast. Small fat fluid level at the duodenum most consistent with ingested contents. Mild amount of retained large bowel stool. Normal appendix.  PERITONEUM/RETROPERITONEUM: Central "misty" mesentery with small, likely reactive lymph nodes. Aortoiliac vessels are normal in course and caliber, trace calcific atherosclerosis. No lymphadenopathy by CT size criteria. Prostate size is normal. No intraperitoneal free fluid nor free air.  SOFT TISSUES/ OSSEOUS STRUCTURES: Nonsuspicious. Small RIGHT, moderate LEFT fat containing inguinal hernias. Small fat containing umbilical hernia. LEFT gluteal injection granuloma. 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  IMPRESSION: No urolithiasis or obstructive uropathy.  Normal appendix.  Nonspecific "misty" mesentery.   Electronically Signed   By: Elon Alas   On: 09/06/2014 03:51   4:48 AM Hematuria with classic story and negative CT are consistent with  ureteral colic with stone passed before the CT was obtained. The patient is pain-free now. He was advised that he should expect no return of pain but if it does he should return for further evaluation.    Shanon Rosser, MD 09/06/14 [phone]

## 2014-09-06 NOTE — Discharge Instructions (Signed)
Flank Pain °Flank pain refers to pain that is located on the side of the body between the upper abdomen and the back. The pain may occur over a short period of time (acute) or may be long-term or reoccurring (chronic). It may be mild or severe. Flank pain can be caused by many things. °CAUSES  °Some of the more common causes of flank pain include: °· Muscle strains.   °· Muscle spasms.   °· A disease of your spine (vertebral disk disease).   °· A lung infection (pneumonia).   °· Fluid around your lungs (pulmonary edema).   °· A kidney infection.   °· Kidney stones.   °· A very painful skin rash caused by the chickenpox virus (shingles).   °· Gallbladder disease.   °HOME CARE INSTRUCTIONS  °Home care will depend on the cause of your pain. In general, °· Rest as directed by your caregiver. °· Drink enough fluids to keep your urine clear or pale yellow. °· Only take over-the-counter or prescription medicines as directed by your caregiver. Some medicines may help relieve the pain. °· Tell your caregiver about any changes in your pain. °· Follow up with your caregiver as directed. °SEEK IMMEDIATE MEDICAL CARE IF:  °· Your pain is not controlled with medicine.   °· You have new or worsening symptoms. °· Your pain increases.   °· You have abdominal pain.   °· You have shortness of breath.   °· You have persistent nausea or vomiting.   °· You have swelling in your abdomen.   °· You feel faint or pass out.   °· You have blood in your urine. °· You have a fever or persistent symptoms for more than 2-3 days. °· You have a fever and your symptoms suddenly get worse. °MAKE SURE YOU:  °· Understand these instructions. °· Will watch your condition. °· Will get help right away if you are not doing well or get worse. °Document Released: 05/23/2005 Document Revised: 12/25/2011 Document Reviewed: 11/14/2011 °[employer]® Patient Information ©2015 [employer]. This information is not intended to replace advice given to you by your  health care provider. Make sure you discuss any questions you have with your health care provider. ° °

## 2014-09-06 NOTE — ED Notes (Signed)
Left flank pain onset 0200,  Woke pt from sleep w nausea

## 2014-09-06 NOTE — ED Notes (Signed)
C/o left flank pain onset 0200  Woke from sleep, nausea,  Denies urinary sx

## 2014-10-21 ENCOUNTER — Ambulatory Visit (INDEPENDENT_AMBULATORY_CARE_PROVIDER_SITE_OTHER): Payer: BLUE CROSS/BLUE SHIELD | Admitting: Podiatry

## 2014-10-21 ENCOUNTER — Encounter: Payer: Self-pay | Admitting: Podiatry

## 2014-10-21 VITALS — BP 133/80 | HR 72 | Resp 12

## 2014-10-21 DIAGNOSIS — M722 Plantar fascial fibromatosis: Secondary | ICD-10-CM

## 2014-10-21 MED ORDER — PREDNISONE 10 MG PO TABS: ORAL_TABLET | ORAL | Status: DC

## 2014-10-21 MED ORDER — TRIAMCINOLONE ACETONIDE 10 MG/ML IJ SUSP
10.0000 mg | Freq: Once | INTRAMUSCULAR | Status: AC
  Administered 2014-10-21: 10 mg

## 2014-10-21 NOTE — Patient Instructions (Signed)

## 2014-10-21 NOTE — Progress Notes (Signed)
   Subjective:    Patient ID: Jerry Keller, male    DOB: 1960-09-25, 54 y.o.   MRN: [id number]  HPI  PT STATED RT FOOT BOTTOM HEEL IS BEEN HURTING FOR 2 MONTHS. THE HEEL IS GETTING WORSE ESPECIALLY WHEN PUTTING PRESSURE. TRIED CORTISONE SHOT BUT NO HELP.  Review of Systems  HENT: Positive for hearing loss, sinus pressure and sneezing.   Eyes: Positive for redness.  Musculoskeletal: Positive for joint swelling and gait problem.       Objective:   Physical Exam        Assessment & Plan:

## 2014-10-22 NOTE — Progress Notes (Signed)
Subjective:     Patient ID: Jerry Keller, male   DOB: 02/20/1961, 54 y.o.   MRN: [id number]  HPI patient presents with severe discomfort in the plantar aspect of the right heel 2 months duration. States it's getting worse over that time especially when putting pressure on it and had a cortisone injection by family physician without relief and patient is on his feet for extensive hours and this problem is getting worse   Review of Systems  All other systems reviewed and are negative.      Objective:   Physical Exam  Constitutional: He is oriented to person, place, and time.  Cardiovascular: Intact distal pulses.   Musculoskeletal: Normal range of motion.  Neurological: He is oriented to person, place, and time.  Skin: Skin is warm.  Nursing note and vitals reviewed.  neurovascular status intact muscle strength adequate range of motion within normal limits with mild equinus condition noted bilateral and exquisite discomfort medial fascial band right over left heel with fluid buildup noted in the medial band of the right heel. Patient has good digital perfusion and is well oriented 3     Assessment:     Plantar fasciitis acute nature right over left heel with moderate depression of the arch is part of the, Katie factor    Plan:     H&P and x-rays reviewed with patient. We are going to take an aggressive conservative course and today I injected the plantar fascia 3 Milligan Kenalog 5 mg Xylocaine and dispensed fascial brace for daytime usage and night splint for nighttime usage. Patient will be reevaluated and  may require and receive orthotics and also at one point may require more aggressive treatment depending on response

## 2014-10-27 ENCOUNTER — Ambulatory Visit: Payer: BLUE CROSS/BLUE SHIELD | Admitting: Podiatry

## 2014-11-14 ENCOUNTER — Ambulatory Visit (INDEPENDENT_AMBULATORY_CARE_PROVIDER_SITE_OTHER): Payer: BLUE CROSS/BLUE SHIELD | Admitting: Podiatry

## 2014-11-14 ENCOUNTER — Encounter: Payer: Self-pay | Admitting: Podiatry

## 2014-11-14 VITALS — BP 115/52 | HR 61 | Resp 15

## 2014-11-14 DIAGNOSIS — M722 Plantar fascial fibromatosis: Secondary | ICD-10-CM | POA: Diagnosis not present

## 2014-11-15 NOTE — Progress Notes (Signed)
Subjective:     Patient ID: Jerry Keller, male   DOB: 1960/05/15, 54 y.o.   MRN: [id number]  HPI patient presents stating my left heel is feeling quite a bit better with diminished inflammation and pain but I do have a depressed arch and I have had this problem for a fairly long time   Review of Systems     Objective:   Physical Exam Neurovascular status intact muscle strength adequate with discomfort plantar heel still present but improved with depression of the arch noted upon gait analysis    Assessment:     Chronic plantar fasciitis secondary to foot structure with inflammation present still at this time but improved from previous visit    Plan:     Reviewed condition and recommended orthotics and scanned for a Berkley type device in order to provide for complete plantar fascial support and reduction of stress on the heel. Advised on physical therapy shoe gear modifications and reappoint when orthotics are ready

## 2015-03-17 ENCOUNTER — Ambulatory Visit (INDEPENDENT_AMBULATORY_CARE_PROVIDER_SITE_OTHER): Payer: BLUE CROSS/BLUE SHIELD | Admitting: Podiatry

## 2015-03-17 DIAGNOSIS — M722 Plantar fascial fibromatosis: Secondary | ICD-10-CM

## 2015-03-17 MED ORDER — TRIAMCINOLONE ACETONIDE 10 MG/ML IJ SUSP
10.0000 mg | Freq: Once | INTRAMUSCULAR | Status: AC
  Administered 2015-03-17: 10 mg

## 2015-03-17 NOTE — Patient Instructions (Signed)

## 2015-03-19 NOTE — Progress Notes (Signed)
Subjective:     Patient ID: Jerry Keller, male   DOB: [birth year], 54 y.o.   MRN: [id number]  HPI patient states I been doing quite a bit better but I'm still having pain in my heel left with prolonged activity   Review of Systems     Objective:   Physical Exam Neurovascular status intact muscle strength adequate with continued discomfort in the plantar fascia at the insertion into the calcaneus    Assessment:     Plantar fasciitis still present with moderate discomfort    Plan:     Reviewed mechanical dysfunction and at this time scanned for custom orthotic devices to reduce stress against the heel and arch and injected the plantar fascia 3 mg Kenalog 5 mg Xylocaine with instructions on reduced activity

## 2016-07-10 ENCOUNTER — Encounter: Payer: Self-pay | Admitting: Podiatry

## 2016-07-10 ENCOUNTER — Ambulatory Visit (INDEPENDENT_AMBULATORY_CARE_PROVIDER_SITE_OTHER): Payer: BLUE CROSS/BLUE SHIELD

## 2016-07-10 ENCOUNTER — Ambulatory Visit (INDEPENDENT_AMBULATORY_CARE_PROVIDER_SITE_OTHER): Payer: BLUE CROSS/BLUE SHIELD | Admitting: Podiatry

## 2016-07-10 DIAGNOSIS — D3610 Benign neoplasm of peripheral nerves and autonomic nervous system, unspecified: Secondary | ICD-10-CM

## 2016-07-10 DIAGNOSIS — M79671 Pain in right foot: Secondary | ICD-10-CM

## 2016-07-10 DIAGNOSIS — M79672 Pain in left foot: Secondary | ICD-10-CM | POA: Diagnosis not present

## 2016-07-10 NOTE — Progress Notes (Signed)
Subjective:     Patient ID: Jerry Keller, male   DOB: 05/19/1960, 56 y.o.   MRN: [id number]  HPI patient presents stating that he has discomfort in his feet with the special pain on the outside of the right foot and numbing sensations. States the orthotics have been helpful the past imbalance and he feels like he needs orthotics as he does not feel supportive   Review of Systems     Objective:   Physical Exam Neurovascular status intact muscle strength adequate with patient noted to have inflammation and discomfort in the outside of the right foot localized in nature with probable issues with his back creating numbness. Patient states it seems to come from the lower Buttocks across the back of the leg.    Assessment:     Inflammatory changes in the foot with possibility for back issues associated with this but no loss of muscle strength currently    Plan:     H&P x-rays reviewed conditions discussed. At this point we're getting him into a new orthotic to try to support the plantar arch properly and we will possibly have to do other things I discussed his back and if it were to get worse he needs to see a doctor but he may try chiropractor first for adjustment  X-ray was negative for signs of fracture with moderate depression of the arch

## 2016-07-17 ENCOUNTER — Encounter: Payer: Self-pay | Admitting: Podiatry

## 2016-07-17 ENCOUNTER — Other Ambulatory Visit: Payer: BLUE CROSS/BLUE SHIELD

## 2016-07-24 ENCOUNTER — Encounter: Payer: Self-pay | Admitting: Podiatry

## 2016-07-24 ENCOUNTER — Other Ambulatory Visit: Payer: BLUE CROSS/BLUE SHIELD

## 2018-01-11 ENCOUNTER — Encounter: Payer: Self-pay | Admitting: Internal Medicine

## 2018-01-12 ENCOUNTER — Encounter: Payer: Self-pay | Admitting: Internal Medicine

## 2018-01-12 ENCOUNTER — Telehealth: Payer: Self-pay | Admitting: Pulmonary Disease

## 2018-01-12 ENCOUNTER — Ambulatory Visit (INDEPENDENT_AMBULATORY_CARE_PROVIDER_SITE_OTHER): Payer: BLUE CROSS/BLUE SHIELD | Admitting: Internal Medicine

## 2018-01-12 VITALS — BP 126/78 | HR 71 | Ht 73.0 in | Wt 263.6 lb

## 2018-01-12 DIAGNOSIS — J3089 Other allergic rhinitis: Secondary | ICD-10-CM | POA: Diagnosis not present

## 2018-01-12 DIAGNOSIS — G4733 Obstructive sleep apnea (adult) (pediatric): Secondary | ICD-10-CM

## 2018-01-12 DIAGNOSIS — J302 Other seasonal allergic rhinitis: Secondary | ICD-10-CM | POA: Insufficient documentation

## 2018-01-12 DIAGNOSIS — G4719 Other hypersomnia: Secondary | ICD-10-CM | POA: Insufficient documentation

## 2018-01-12 NOTE — Telephone Encounter (Signed)
Pt returning call concerning appointment today. Pt contact number [phone]

## 2018-01-12 NOTE — Assessment & Plan Note (Addendum)
Effects from CPAP with better sleep and no snoring.  Download confirms good compliance and control.  I am not sure that adjusting CPAP is going to help him but his machine is old enough to be eligible for replacement. Plan-replace old machine and change to AutoPap 10-20 Emphasized importance of adequate sleep, naps if possible, and minimize intrusive phone calls about work at night while sleeping.

## 2018-01-12 NOTE — Telephone Encounter (Signed)
Spoke with Joellen Jersey, pt can come in today and see CY at 4:00pm.  Spoke with pt. He is aware of this information. OV has been scheduled. Nothing further was needed.
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
## 2018-01-12 NOTE — Assessment & Plan Note (Signed)
He had been evaluated by Dr. Lovenia Shuck in the past for nasal septal deviation but did not follow-up to get anything done about it.  There is a component of allergic rhinitis which may be seasonal. Plan-emphasize regular use of Flonase.  Consider full facemask if nasal obstruction is important.

## 2018-01-12 NOTE — Patient Instructions (Addendum)
Order- DME [facility] Please replace old CPAP machine, changing to auto 10-20, mask of choice, humidifier, supplies, AirView  Try to find ways to get more sleep- going to bed 1/2 hour earlier each night, and cutting down on the phone calls after you go to sleep.  Try otc caffeine tablet such as NoDoz or Vivarin  Please call as needed

## 2018-01-12 NOTE — Assessment & Plan Note (Signed)
This problem may be stressful job with long work hours, interrupted sleep because of phone calls at night.  High-priority is safe driving as discussed.  He understands this.  He must not drive if too sleepy.  Discussed naps when able.  Manage sleep time with protection from phone calls.  Okay to use occasional caffeine tablet if needed as discussed.

## 2018-01-25 ENCOUNTER — Encounter: Payer: Self-pay | Admitting: Internal Medicine

## 2018-02-10 ENCOUNTER — Telehealth: Payer: Self-pay | Admitting: Internal Medicine

## 2018-02-10 NOTE — Telephone Encounter (Signed)
I checked with Rodena Piety to see if she had received any form on pt and she stated she had not received anything.  Called and spoke with pt letting him know that we have not received a form from the insurance company. Pt stated he would call [employer] to get more information from them in regards to what authorization form they were talking about that needed to be filled out by [place].  Pt stated he would call our office once he had more information.  Will await a call back from pt.

## 2018-02-10 NOTE — Telephone Encounter (Signed)
No form has been seen on our desks for patient please check with Rodena Piety to see if this is a CMN form. Thanks.

## 2018-02-10 NOTE — Telephone Encounter (Signed)
I don't recall seeing this form.

## 2018-02-10 NOTE — Telephone Encounter (Signed)
Called and spoke with pt who stated [employer] told him that there was an authorization form that needed to be filled out by [place] in order for him to be able to receive a new cpap.  Dr. Annamaria Boots, please advise if you have received a form on pt. Thanks!

## 2018-02-12 NOTE — Telephone Encounter (Signed)
ATC pt, no answer. Left message for pt to call back.  

## 2018-02-16 NOTE — Telephone Encounter (Signed)
ATC and follow up on this will call back

## 2018-02-17 NOTE — Telephone Encounter (Signed)
lmtcb for pt.  

## 2018-02-18 NOTE — Telephone Encounter (Signed)
Called patient, unable to reach. Left message to give [place] a call back. Per triage protocol will close encounter.

## 2018-02-23 ENCOUNTER — Ambulatory Visit: Payer: BLUE CROSS/BLUE SHIELD | Admitting: Internal Medicine

## 2018-05-06 ENCOUNTER — Ambulatory Visit (INDEPENDENT_AMBULATORY_CARE_PROVIDER_SITE_OTHER): Payer: BLUE CROSS/BLUE SHIELD | Admitting: Internal Medicine

## 2018-05-06 ENCOUNTER — Encounter: Payer: Self-pay | Admitting: Internal Medicine

## 2018-05-06 VITALS — BP 124/72 | HR 69 | Ht 73.0 in | Wt 261.0 lb

## 2018-05-06 DIAGNOSIS — R0789 Other chest pain: Secondary | ICD-10-CM | POA: Diagnosis not present

## 2018-05-06 DIAGNOSIS — G4733 Obstructive sleep apnea (adult) (pediatric): Secondary | ICD-10-CM
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
## 2018-05-06 NOTE — Assessment & Plan Note (Signed)
He continues to benefit from CPAP with improved sleep, but needs to get more sleep time to clear the daytime tiredness.  He is just working very long hours. Plan-[facility] to show him how to use his new machine.  Continue auto 10-15.

## 2018-05-06 NOTE — Assessment & Plan Note (Signed)
With the pain he describes today sounds musculoskeletal.  I discussed this with him and encouraged him to report any progression or change in character to his primary physician.

## 2018-05-06 NOTE — Patient Instructions (Signed)
Order- DME [facility]- please train Mr Chaddock on how to use his new CPAP machine. Continue auto 10-12, mask of choice, humidifier, supplies, AirView/ download card  Please call if we can help

## 2018-08-10 ENCOUNTER — Ambulatory Visit: Payer: BLUE CROSS/BLUE SHIELD | Admitting: Internal Medicine

## 2018-11-26 ENCOUNTER — Ambulatory Visit: Payer: BLUE CROSS/BLUE SHIELD | Admitting: Internal Medicine

## 2019-06-22 ENCOUNTER — Other Ambulatory Visit: Payer: Self-pay

## 2019-06-22 ENCOUNTER — Emergency Department (HOSPITAL_BASED_OUTPATIENT_CLINIC_OR_DEPARTMENT_OTHER): Payer: BC Managed Care – PPO

## 2019-06-22 ENCOUNTER — Observation Stay (HOSPITAL_BASED_OUTPATIENT_CLINIC_OR_DEPARTMENT_OTHER)
Admission: EM | Admit: 2019-06-22 | Discharge: 2019-06-23 | Disposition: A | Discharge status: Home / Self Care | Payer: BC Managed Care – PPO | Attending: Family Medicine | Admitting: Family Medicine

## 2019-06-22 ENCOUNTER — Encounter (HOSPITAL_BASED_OUTPATIENT_CLINIC_OR_DEPARTMENT_OTHER): Payer: Self-pay | Admitting: Emergency Medicine

## 2019-06-22 DIAGNOSIS — Z79890 Hormone replacement therapy (postmenopausal): Secondary | ICD-10-CM | POA: Diagnosis not present

## 2019-06-22 DIAGNOSIS — Z79899 Other long term (current) drug therapy: Secondary | ICD-10-CM | POA: Diagnosis not present

## 2019-06-22 DIAGNOSIS — R0789 Other chest pain: Secondary | ICD-10-CM | POA: Diagnosis not present

## 2019-06-22 DIAGNOSIS — Z6836 Body mass index (BMI) 36.0-36.9, adult: Secondary | ICD-10-CM | POA: Insufficient documentation

## 2019-06-22 DIAGNOSIS — N400 Enlarged prostate without lower urinary tract symptoms: Secondary | ICD-10-CM | POA: Insufficient documentation

## 2019-06-22 DIAGNOSIS — F419 Anxiety disorder, unspecified: Secondary | ICD-10-CM | POA: Diagnosis not present

## 2019-06-22 DIAGNOSIS — Z8249 Family history of ischemic heart disease and other diseases of the circulatory system: Secondary | ICD-10-CM | POA: Insufficient documentation

## 2019-06-22 DIAGNOSIS — Z7982 Long term (current) use of aspirin: Secondary | ICD-10-CM | POA: Insufficient documentation

## 2019-06-22 DIAGNOSIS — I25110 Atherosclerotic heart disease of native coronary artery with unstable angina pectoris: Secondary | ICD-10-CM | POA: Diagnosis not present

## 2019-06-22 DIAGNOSIS — F329 Major depressive disorder, single episode, unspecified: Secondary | ICD-10-CM | POA: Insufficient documentation

## 2019-06-22 DIAGNOSIS — I2510 Atherosclerotic heart disease of native coronary artery without angina pectoris: Secondary | ICD-10-CM | POA: Diagnosis present

## 2019-06-22 DIAGNOSIS — G4733 Obstructive sleep apnea (adult) (pediatric): Secondary | ICD-10-CM | POA: Insufficient documentation

## 2019-06-22 DIAGNOSIS — I200 Unstable angina: Secondary | ICD-10-CM | POA: Diagnosis present

## 2019-06-22 DIAGNOSIS — E785 Hyperlipidemia, unspecified: Secondary | ICD-10-CM | POA: Insufficient documentation

## 2019-06-22 DIAGNOSIS — Z20822 Contact with and (suspected) exposure to covid-19: Secondary | ICD-10-CM | POA: Insufficient documentation

## 2019-06-22 DIAGNOSIS — E039 Hypothyroidism, unspecified: Secondary | ICD-10-CM | POA: Insufficient documentation

## 2019-06-22 DIAGNOSIS — I25119 Atherosclerotic heart disease of native coronary artery with unspecified angina pectoris: Secondary | ICD-10-CM | POA: Diagnosis not present

## 2019-06-22 DIAGNOSIS — G44209 Tension-type headache, unspecified, not intractable: Secondary | ICD-10-CM | POA: Insufficient documentation

## 2019-06-22 DIAGNOSIS — Z87891 Personal history of nicotine dependence: Secondary | ICD-10-CM | POA: Diagnosis not present

## 2019-06-22 DIAGNOSIS — K219 Gastro-esophageal reflux disease without esophagitis: Secondary | ICD-10-CM | POA: Diagnosis present

## 2019-06-22 DIAGNOSIS — R079 Chest pain, unspecified: Secondary | ICD-10-CM | POA: Diagnosis present

## 2019-06-22 LAB — BASIC METABOLIC PANEL
Anion gap: 6 (ref 5–15)
BUN: 23 mg/dL — ABNORMAL HIGH (ref 6–20)
CO2: 25 mmol/L (ref 22–32)
Calcium: 8.6 mg/dL — ABNORMAL LOW (ref 8.9–10.3)
Chloride: 105 mmol/L (ref 98–111)
Creatinine, Ser: 1.21 mg/dL (ref 0.61–1.24)
GFR calc Af Amer: >60 mL/min (ref >60)
GFR calc non Af Amer: >60 mL/min (ref >60)
Glucose, Bld: 102 mg/dL — ABNORMAL HIGH (ref 70–99)
Potassium: 4 mmol/L (ref 3.5–5.1)
Sodium: 136 mmol/L (ref 135–145)

## 2019-06-22 LAB — CBC
HCT: 47.3 % (ref 39.0–52.0)
Hemoglobin: 15.7 g/dL (ref 13.0–17.0)
MCH: 32.6 pg (ref 26.0–34.0)
MCHC: 33.2 g/dL (ref 30.0–36.0)
MCV: 98.1 fL (ref 80.0–100.0)
Platelets: 191 10*3/uL (ref 150–400)
RBC: 4.82 MIL/uL (ref 4.22–5.81)
RDW: 12.1 % (ref 11.5–15.5)
WBC: 5.3 10*3/uL (ref 4.0–10.5)
nRBC: 0 % (ref 0.0–0.2)

## 2019-06-22 LAB — TROPONIN I (HIGH SENSITIVITY)
Troponin I (High Sensitivity): 6 ng/L (ref <18)
Troponin I (High Sensitivity): 5 ng/L (ref <18)
Troponin I (High Sensitivity): 7 ng/L (ref <18)

## 2019-06-22 LAB — TSH: TSH: 1.369 u[IU]/mL (ref 0.350–4.500)

## 2019-06-22 LAB — HEMOGLOBIN A1C
Hgb A1c MFr Bld: 5.7 % — ABNORMAL HIGH (ref 4.8–5.6)
Mean Plasma Glucose: 116.89 mg/dL

## 2019-06-22 LAB — SARS CORONAVIRUS 2 (TAT 6-24 HRS): SARS Coronavirus 2: NEGATIVE

## 2019-06-22 LAB — HEPARIN LEVEL (UNFRACTIONATED): Heparin Unfractionated: 0.35 IU/mL (ref 0.30–0.70)

## 2019-06-22 LAB — LIPID PANEL
Cholesterol: 208 mg/dL — ABNORMAL HIGH (ref 0–200)
HDL: 35 mg/dL — ABNORMAL LOW (ref >40)
LDL Cholesterol: 129 mg/dL — ABNORMAL HIGH (ref 0–99)
Total CHOL/HDL Ratio: 5.9 RATIO
Triglycerides: 218 mg/dL — ABNORMAL HIGH (ref <150)
VLDL: 44 mg/dL — ABNORMAL HIGH (ref 0–40)

## 2019-06-22 LAB — MAGNESIUM: Magnesium: 2 mg/dL (ref 1.7–2.4)

## 2019-06-22 LAB — HIV ANTIBODY (ROUTINE TESTING W REFLEX): HIV Screen 4th Generation wRfx: NONREACTIVE

## 2019-06-22 MED ORDER — ACETAMINOPHEN 650 MG RE SUPP: 650.0000 mg | Freq: Four times a day (QID) | RECTAL | Status: DC | PRN

## 2019-06-22 MED ORDER — FOLIC ACID 1 MG PO TABS
500.0000 ug | ORAL_TABLET | Freq: Every day | ORAL | Status: DC
  Administered 2019-06-22 – 2019-06-23 (×2): 0.5 mg via ORAL
  Filled 2019-06-22 (×3): qty 1

## 2019-06-22 MED ORDER — NITROGLYCERIN 0.4 MG SL SUBL
0.4000 mg | SUBLINGUAL_TABLET | SUBLINGUAL | Status: DC | PRN
  Administered 2019-06-22: 0.4 mg via SUBLINGUAL
  Filled 2019-06-22: qty 1

## 2019-06-22 MED ORDER — ASPIRIN EC 81 MG PO TBEC: 81.0000 mg | DELAYED_RELEASE_TABLET | Freq: Every day | ORAL | Status: DC

## 2019-06-22 MED ORDER — SODIUM CHLORIDE 0.9 % IV SOLN: INTRAVENOUS | Status: DC | PRN

## 2019-06-22 MED ORDER — CITALOPRAM HYDROBROMIDE 20 MG PO TABS
20.0000 mg | ORAL_TABLET | Freq: Every day | ORAL | Status: DC
  Administered 2019-06-22 – 2019-06-23 (×2): 20 mg via ORAL
  Filled 2019-06-22 (×2): qty 1

## 2019-06-22 MED ORDER — TAMSULOSIN HCL 0.4 MG PO CAPS
0.4000 mg | ORAL_CAPSULE | Freq: Every day | ORAL | Status: DC
  Administered 2019-06-23: 0.4 mg via ORAL
  Filled 2019-06-22: qty 1

## 2019-06-22 MED ORDER — ALPRAZOLAM 0.5 MG PO TABS
0.5000 mg | ORAL_TABLET | Freq: Two times a day (BID) | ORAL | Status: DC
  Administered 2019-06-22 – 2019-06-23 (×2): 0.5 mg via ORAL
  Filled 2019-06-22: qty 1
  Filled 2019-06-22: qty 2

## 2019-06-22 MED ORDER — HEPARIN BOLUS VIA INFUSION
4000.0000 [IU] | Freq: Once | INTRAVENOUS | Status: AC
  Administered 2019-06-22: 4000 [IU] via INTRAVENOUS

## 2019-06-22 MED ORDER — NITROGLYCERIN 0.4 MG SL SUBL
0.4000 mg | SUBLINGUAL_TABLET | SUBLINGUAL | Status: AC | PRN
  Administered 2019-06-22 (×3): 0.4 mg via SUBLINGUAL
  Filled 2019-06-22 (×2): qty 1

## 2019-06-22 MED ORDER — METOPROLOL TARTRATE 50 MG PO TABS
50.0000 mg | ORAL_TABLET | Freq: Every morning | ORAL | Status: DC
  Administered 2019-06-23: 50 mg via ORAL
  Filled 2019-06-22: qty 1

## 2019-06-22 MED ORDER — FENTANYL CITRATE (PF) 100 MCG/2ML IJ SOLN: 100.0000 ug | INTRAMUSCULAR | Status: DC | PRN

## 2019-06-22 MED ORDER — ONDANSETRON HCL 4 MG/2ML IJ SOLN: 4.0000 mg | Freq: Four times a day (QID) | INTRAMUSCULAR | Status: DC | PRN

## 2019-06-22 MED ORDER — PANTOPRAZOLE SODIUM 40 MG PO TBEC
40.0000 mg | DELAYED_RELEASE_TABLET | Freq: Every day | ORAL | Status: DC
  Administered 2019-06-22 – 2019-06-23 (×2): 40 mg via ORAL
  Filled 2019-06-22 (×2): qty 1

## 2019-06-22 MED ORDER — ASPIRIN 81 MG PO CHEW
324.0000 mg | CHEWABLE_TABLET | Freq: Once | ORAL | Status: AC
  Administered 2019-06-22: 324 mg via ORAL
  Filled 2019-06-22: qty 4

## 2019-06-22 MED ORDER — SODIUM CHLORIDE 0.9% FLUSH
3.0000 mL | Freq: Two times a day (BID) | INTRAVENOUS | Status: DC
  Administered 2019-06-23: 3 mL via INTRAVENOUS

## 2019-06-22 MED ORDER — SODIUM CHLORIDE 0.9 % IV SOLN: 250.0000 mL | INTRAVENOUS | Status: DC | PRN

## 2019-06-22 MED ORDER — ATORVASTATIN CALCIUM 80 MG PO TABS
80.0000 mg | ORAL_TABLET | Freq: Every day | ORAL | Status: DC
  Administered 2019-06-22: 80 mg via ORAL
  Filled 2019-06-22: qty 1

## 2019-06-22 MED ORDER — ACETAMINOPHEN 325 MG PO TABS
650.0000 mg | ORAL_TABLET | Freq: Four times a day (QID) | ORAL | Status: DC | PRN
  Administered 2019-06-22 – 2019-06-23 (×3): 650 mg via ORAL
  Filled 2019-06-22 (×3): qty 2

## 2019-06-22 MED ORDER — LEVOTHYROXINE SODIUM 100 MCG PO TABS
100.0000 ug | ORAL_TABLET | Freq: Every day | ORAL | Status: DC
  Administered 2019-06-23: 100 ug via ORAL
  Filled 2019-06-22: qty 1

## 2019-06-22 MED ORDER — SODIUM CHLORIDE 0.9 % WEIGHT BASED INFUSION
3.0000 mL/kg/h | INTRAVENOUS | Status: AC
  Administered 2019-06-23: 3 mL/kg/h via INTRAVENOUS

## 2019-06-22 MED ORDER — NITROGLYCERIN 2 % TD OINT
1.0000 [in_us] | TOPICAL_OINTMENT | Freq: Four times a day (QID) | TRANSDERMAL | Status: DC
  Administered 2019-06-22: 1 [in_us] via TOPICAL
  Filled 2019-06-22: qty 30

## 2019-06-22 MED ORDER — SODIUM CHLORIDE 0.9% FLUSH: 3.0000 mL | INTRAVENOUS | Status: DC | PRN

## 2019-06-22 MED ORDER — ONDANSETRON HCL 4 MG PO TABS: 4.0000 mg | ORAL_TABLET | Freq: Four times a day (QID) | ORAL | Status: DC | PRN

## 2019-06-22 MED ORDER — MORPHINE SULFATE (PF) 2 MG/ML IV SOLN: 2.0000 mg | INTRAVENOUS | Status: DC | PRN

## 2019-06-22 MED ORDER — SODIUM CHLORIDE 0.9 % WEIGHT BASED INFUSION
1.0000 mL/kg/h | INTRAVENOUS | Status: DC
  Administered 2019-06-23 (×2): 1 mL/kg/h via INTRAVENOUS

## 2019-06-22 MED ORDER — HEPARIN (PORCINE) 25000 UT/250ML-% IV SOLN
1400.0000 [IU]/h | INTRAVENOUS | Status: DC
  Administered 2019-06-22: 1200 [IU]/h via INTRAVENOUS
  Administered 2019-06-23: 1400 [IU]/h via INTRAVENOUS
  Filled 2019-06-22 (×2): qty 250

## 2019-06-22 MED ORDER — ASPIRIN EC 81 MG PO TBEC
81.0000 mg | DELAYED_RELEASE_TABLET | Freq: Every day | ORAL | Status: DC
  Administered 2019-06-22: 81 mg via ORAL
  Filled 2019-06-22: qty 1

## 2019-06-22 MED ORDER — ASPIRIN 81 MG PO CHEW
81.0000 mg | CHEWABLE_TABLET | ORAL | Status: AC
  Administered 2019-06-23: 81 mg via ORAL
  Filled 2019-06-22: qty 1

## 2019-06-22 NOTE — ED Triage Notes (Signed)
Patient arrived via POV c/o chest pain starting approximately 1am on 06/21/19. Patient states it woke him from sleep with pain in left chest radiating to left neck and left arm. Patient states he applied heating pad to chest in order to relieve pain with no relief. Patient is AO x 4, VS WDL, Normal gait.

## 2019-06-22 NOTE — ED Provider Notes (Signed)
[facility] EMERGENCY DEPARTMENT Provider Note   CSN: [id number] Arrival date & time: 06/22/19  0327     History Chief Complaint  Patient presents with  . Chest Pain    Jerry Keller is a 59 y.o. male.  HPI  HPI: A 59 year old patient presents for evaluation of chest pain. Initial onset of pain was approximately 1-3 hours ago. The patient's chest pain is described as heaviness/pressure/tightness and is not worse with exertion. The patient's chest pain is middle- or left-sided, is not well-localized, is not sharp and does radiate to the arms/jaw/neck. The patient does not complain of nausea and denies diaphoresis. The patient has a family history of coronary artery disease in a first-degree relative with onset less than age 29. The patient has no history of stroke, has no history of peripheral artery disease, has not smoked in the past 90 days, denies any history of treated diabetes, is not hypertensive, has no history of hypercholesterolemia and does not have an elevated BMI (>=30).   Patient reports that the past 2 nights he has woken up with chest pain and pressure that radiates to left arm and neck.  He reports shortness of breath.  He also reports recent fatigue and shortness of breath with exertion.  This episode of chest pain started approximately 2 to 3 hours ago.  He reports under a great deal of stress at work. He reports cardiac cath several years ago that showed mild CAD His course is stable, nothing improves his symptoms  Past Medical History:  Diagnosis Date  . GERD (gastroesophageal reflux disease)   . Headache, cluster   . Hyperlipidemia    a. Mild - statin started 02/2014.  Marland Kitchen Hypothyroidism   . Non-obstructive CAD    a. 02/2014 Cath: LM nl, LAD 24m/d, LCX nl, RCA 20p/m, EF 55-60%-->Med Rx.  . Palpitations   . Sleep apnea     Patient Active Problem List   Diagnosis Date Noted  . Excessive daytime sleepiness 01/12/2018  . Seasonal and perennial allergic  rhinitis 01/12/2018  . CAD in native artery 03/25/2014  . Pain of left calf 03/25/2014  . Morbid obesity ([place]) 03/09/2014  . Midsternal chest pain 03/09/2014  . Unstable angina ([place]) 03/08/2014  . Hypothyroid 03/08/2014  . Chest pain   . OSA (obstructive sleep apnea) 07/27/2012    Past Surgical History:  Procedure Laterality Date  . COLONOSCOPY N/A 07/30/2013   Procedure: COLONOSCOPY;  Surgeon: Beryle Beams, MD;  Location: [facility];  Service: Endoscopy;  Laterality: N/A;  . LEFT HEART CATHETERIZATION WITH CORONARY ANGIOGRAM N/A 03/08/2014   Procedure: LEFT HEART CATHETERIZATION WITH CORONARY ANGIOGRAM;  Surgeon: Burnell Blanks, MD;  Location: [facility] CATH LAB;  Service: Cardiovascular;  Laterality: N/A;  . TONSILLECTOMY AND ADENOIDECTOMY     as a child       Family History  Problem Relation Age of Onset  . Hypertension Mother   . Hyperlipidemia Father   . Hypertension Father   . Prostate cancer Father   . Sleep apnea Father   . Heart attack Maternal Uncle 51  . Heart attack Maternal Grandfather 57    Social History   Tobacco Use  . Smoking status: Former Smoker    Packs/day: 1.00    Years: 6.00    Pack years: 6.00    Types: Cigarettes    Quit date: 04/15/2000    Years since quitting: 19.1  . Smokeless tobacco: Never Used  Substance Use Topics  . Alcohol use:  Yes    Comment: socially  . Drug use: No    Home Medications Prior to Admission medications   Medication Sig Start Date End Date Taking? Authorizing Provider  ALPRAZolam Duanne Moron) 0.5 MG tablet Take 1 tablet by mouth every morning. 03/12/14   [provider]  aspirin EC 81 MG tablet Take 81 mg by mouth daily.    [provider]  cholecalciferol (VITAMIN D) 400 UNITS TABS Take 400 Units by mouth daily.     [provider]  cyanocobalamin 2000 MCG tablet Take 2,000 mcg by mouth daily. Once a week    [provider]  fluticasone Asencion Islam) 50 MCG/ACT nasal spray  06/14/16    [provider]  folic acid (FOLVITE) Q000111Q MCG tablet Take 400 mcg by mouth daily.     [provider]  HYDROcodone-acetaminophen (NORCO/VICODIN) 5-325 MG per tablet Take 0.5-1 tablets by mouth every 6 (six) hours as needed for moderate pain.    [provider]  levothyroxine (SYNTHROID, LEVOTHROID) 88 MCG tablet Take 88 mcg by mouth daily before breakfast.     [provider]  Magnesium 250 MG TABS Take 250 mg by mouth daily.     [provider]  metoprolol tartrate (LOPRESSOR) 25 MG tablet Take 25 mg by mouth daily. 03/03/14   [provider]  pantoprazole (PROTONIX) 40 MG tablet Take 1 tablet (40 mg total) by mouth daily. 03/09/14   Theora Gianotti, NP  Pyridoxine HCl (VITAMIN B-6 PO) Take 100 mg by mouth daily.     [provider]  testosterone cypionate (DEPOTESTOTERONE CYPIONATE) 200 MG/ML injection Inject into the muscle. 0.4 every week (Sundays)    [provider]  vitamin C (ASCORBIC ACID) 500 MG tablet Take 1,000 mg by mouth daily.     [provider]    Allergies    Patient has no known allergies.  Review of Systems   Review of Systems  Constitutional: Negative for fever.  Respiratory: Positive for shortness of breath.   Cardiovascular: Positive for chest pain.  Gastrointestinal: Negative for blood in stool and vomiting.  All other systems reviewed and are negative.   Physical Exam Updated Vital Signs BP 138/81 (BP Location: Left Arm)   Pulse 60   Temp 97.6 F (36.4 C) (Oral)   Resp 16   Ht 1.829 m (6')   Wt 121.6 kg   SpO2 96%   BMI 36.35 kg/m   Physical Exam  CONSTITUTIONAL: Well developed/well nourished HEAD: Normocephalic/atraumatic EYES: EOMI ENMT: Mucous membranes moist NECK: supple no meningeal signs SPINE/BACK:entire spine nontender CV: S1/S2 noted, no murmurs/rubs/gallops noted LUNGS: Lungs are clear to auscultation bilaterally, no apparent distress ABDOMEN:  soft, nontender, no rebound or guarding, bowel sounds noted throughout abdomen GU:no cva tenderness NEURO: Pt is awake/alert/appropriate, moves all extremitiesx4.  No facial droop.   EXTREMITIES: pulses normal/equalx4, full ROM, no calf tenderness or edema SKIN: warm, color normal PSYCH: no abnormalities of mood noted, alert and oriented to situation  ED Results / Procedures / Treatments   Labs (all labs ordered are listed, but only abnormal results are displayed) Labs Reviewed  BASIC METABOLIC PANEL - Abnormal; Notable for the following components:      Result Value   Glucose, Bld 102 (*)    BUN 23 (*)    Calcium 8.6 (*)    All other components within normal limits  SARS CORONAVIRUS 2 (TAT 6-24 HRS)  CBC  TROPONIN I (HIGH SENSITIVITY)  TROPONIN I (HIGH  SENSITIVITY)    EKG EKG Interpretation  Date/Time:  Tuesday June 22 2019 03:44:31 EST Ventricular Rate:  59 PR Interval:    QRS Duration: 112 QT Interval:  429 QTC Calculation: 425 R Axis:   25 Text Interpretation: Sinus rhythm Incomplete right bundle branch block Minimal ST elevation, anterior leads No significant change since last tracing Confirmed by Ripley Fraise [phone]) on 06/22/2019 3:47:38 AM   Radiology DG Chest 2 View  Result Date: 06/22/2019 CLINICAL DATA:  Chest pain EXAM: CHEST - 2 VIEW COMPARISON:  03/08/2014 FINDINGS: Normal heart size and mediastinal contours. No acute infiltrate or edema. No effusion or pneumothorax. No acute osseous findings. IMPRESSION: Stable chest.  No visible active disease. Electronically Signed   By: Monte Fantasia M.D.   On: 06/22/2019 04:22    Procedures .Critical Care Performed by: Ripley Fraise, MD Authorized by: Ripley Fraise, MD   Critical care provider statement:    Critical care time (minutes):  39   Critical care start time:  06/22/2019 6:01 AM   Critical care end time:  06/22/2019 6:40 AM   Critical care time was exclusive of:  Separately billable procedures and  treating other patients   Critical care was necessary to treat or prevent imminent or life-threatening deterioration of the following conditions:  Cardiac failure   Critical care was time spent personally by me on the following activities:  Evaluation of patient's response to treatment, discussions with consultants, discussions with primary provider, development of treatment plan with patient or surrogate, examination of patient, review of old charts, re-evaluation of patient's condition, pulse oximetry, ordering and review of radiographic studies, ordering and review of laboratory studies and ordering and performing treatments and interventions     Medications Ordered in ED Medications  fentaNYL (SUBLIMAZE) injection 100 mcg (has no administration in time range)  aspirin chewable tablet 324 mg (324 mg Oral Given 06/22/19 0441)  nitroGLYCERIN (NITROSTAT) SL tablet 0.4 mg (0.4 mg Sublingual Given 06/22/19 [id number])    ED Course  I have reviewed the triage vital signs and the nursing notes.  Pertinent labs & imaging results that were available during my care of the patient were reviewed by me and considered in my medical decision making (see chart for details).    MDM Rules/Calculators/A&P HEAR Score: 4                    6:43 AM Patient presented for episodes of chest pain for the past 2 nights. Patient appears uncomfortable.  I have a high concern for acute coronary syndrome.  He has no acute EKG changes.  However he does continue to have chest pain. Repeat EKG at 6:35 AM is unchanged with prior  EKG Interpretation  Date/Time:  Tuesday June 22 2019 06:35:50 EST Ventricular Rate:  60 PR Interval:    QRS Duration: 105 QT Interval:  431 QTC Calculation: 431 R Axis:   40 Text Interpretation: Sinus rhythm RSR' in V1 or V2, right VCD or RVH Minimal ST elevation, anterior leads No significant change since last tracing Confirmed by Ripley Fraise [phone]) on 06/22/2019 6:42:46 AM      6:45  AM  After multiple doses of nitro, he is still having pain He will be admitted to the hospital.  Discussed with Dr. Myna Hidalgo for admission to the [facility] stepdown unit. We will also dose patient with heparin. Low suspicion for PE/dissection at this time.  Blood pressure is appropriate, he has equal distal pulses in all 4 extremities,  no pleuritic chest pain reported Patient will be endorsed to the daytime ER staff while awaiting admission.  If chest pain begins to improve he may be downgraded to telemetry. However he will require cardiology consultation while inpatient final Clinical Impression(s) / ED Diagnoses Final diagnoses:  Unstable angina [facility])    Rx / DC Orders ED Discharge Orders    None       Ripley Fraise, MD 06/22/19 [phone]

## 2019-06-22 NOTE — H&P (Addendum)
History and Physical    Jerry Keller [id number] DOB: 03-Jul-1960 DOA: 06/22/2019  PCP: Christain Sacramento, MD  Patient coming from: [facility] ED I have personally briefly reviewed patient's old medical records in [place]  Chief Complaint: Left-sided chest pain  HPI: Jerry Keller is a 59 y.o. male with medical history significant of hyperlipidemia, obstructive sleep apnea on CPAP at home, obesity, GERD, coronary artery disease status post cath in 2015 presented to ER with left-sided chest pain.  Patient tells me that he has left-sided chest pain, severe in intensity, radiates to his left elbow, neck, back, 10 out of 10, pressure-like, associated with shortness of breath and mild leg swelling since couple of weeks for which he has been using heating pads however since 2 days he has woken up middle of night with chest pain.  He tells me that heating pads was not helping him at all.  Denies nausea, vomiting, headache, blurry vision, palpitation, orthopnea, PND, fever, chills, cough, congestion, urinary or bowel changes.  He has obstructive sleep apnea and he is compliant with CPAP.  No history of smoking, illicit drug use however drinks alcohol occasionally.  He had cardiac cath in 2015 which showed mild coronary artery disease.  At [facility] ED: His initial work-up such as CBC, BMP, chest x-ray, troponin all came back within normal limits.  EKG: No acute changes.  Patient started on heparin and transferred to [facility] for further evaluation and management of his chest pain.  Review of Systems: As per HPI otherwise negative.    Past Medical History:  Diagnosis Date  . GERD (gastroesophageal reflux disease)   . Headache, cluster   . Hyperlipidemia    a. Mild - statin started 02/2014.  Marland Kitchen Hypothyroidism   . Non-obstructive CAD    a. 02/2014 Cath: LM nl, LAD 69m/d, LCX nl, RCA 20p/m, EF 55-60%-->Med Rx.  . Palpitations   . Sleep apnea     Past Surgical History:  Procedure  Laterality Date  . COLONOSCOPY N/A 07/30/2013   Procedure: COLONOSCOPY;  Surgeon: Beryle Beams, MD;  Location: [facility] ENDOSCOPY;  Service: Endoscopy;  Laterality: N/A;  . LEFT HEART CATHETERIZATION WITH CORONARY ANGIOGRAM N/A 03/08/2014   Procedure: LEFT HEART CATHETERIZATION WITH CORONARY ANGIOGRAM;  Surgeon: Burnell Blanks, MD;  Location: [facility] CATH LAB;  Service: Cardiovascular;  Laterality: N/A;  . TONSILLECTOMY AND ADENOIDECTOMY     as a child     reports that he quit smoking about 19 years ago. His smoking use included cigarettes. He has a 6.00 pack-year smoking history. He has never used smokeless tobacco. He reports current alcohol use. He reports that he does not use drugs.  No Known Allergies  Family History  Problem Relation Age of Onset  . Hypertension Mother   . Hyperlipidemia Father   . Hypertension Father   . Prostate cancer Father   . Sleep apnea Father   . Heart attack Maternal Uncle 51  . Heart attack Maternal Grandfather 57    Prior to Admission medications   Medication Sig Start Date End Date Taking? Authorizing Provider  ALPRAZolam Duanne Moron) 0.5 MG tablet Take 1 tablet by mouth every morning. 03/12/14   [provider]  aspirin EC 81 MG tablet Take 81 mg by mouth daily.    [provider]  cholecalciferol (VITAMIN D) 400 UNITS TABS Take 400 Units by mouth daily.     [provider]  cyanocobalamin 2000 MCG tablet Take 2,000 mcg by mouth daily.  Once a week    [provider]  fluticasone Asencion Islam) 50 MCG/ACT nasal spray  06/14/16   [provider]  folic acid (FOLVITE) Q000111Q MCG tablet Take 400 mcg by mouth daily.     [provider]  HYDROcodone-acetaminophen (NORCO/VICODIN) 5-325 MG per tablet Take 0.5-1 tablets by mouth every 6 (six) hours as needed for moderate pain.    [provider]  levothyroxine (SYNTHROID, LEVOTHROID) 88 MCG tablet Take 88 mcg by mouth daily before breakfast.     [provider]  Magnesium 250 MG TABS Take 250 mg by mouth daily.     [provider]  metoprolol tartrate (LOPRESSOR) 25 MG tablet Take 25 mg by mouth daily. 03/03/14   [provider]  pantoprazole (PROTONIX) 40 MG tablet Take 1 tablet (40 mg total) by mouth daily. 03/09/14   Theora Gianotti, NP  Pyridoxine HCl (VITAMIN B-6 PO) Take 100 mg by mouth daily.     [provider]  testosterone cypionate (DEPOTESTOTERONE CYPIONATE) 200 MG/ML injection Inject into the muscle. 0.4 every week (Sundays)    [provider]  vitamin C (ASCORBIC ACID) 500 MG tablet Take 1,000 mg by mouth daily.     [provider]    Physical Exam: Vitals:   06/22/19 1000 06/22/19 1020 06/22/19 1059 06/22/19 1231  BP: 109/60 109/60 114/66 133/78  Pulse: (!) 55 (!) 58 64 (!) 58  Resp: 13  18 16   Temp:      TempSrc:      SpO2: 97% 98% 98% 96%  Weight:      Height:        Constitutional: NAD, calm, comfortable, obese Eyes: PERRL, lids and conjunctivae normal ENMT: Mucous membranes are moist. Posterior pharynx clear of any exudate or lesions.Normal dentition.  Neck: normal, supple, no masses, no thyromegaly Respiratory: clear to auscultation bilaterally, no wheezing, no crackles. Normal respiratory effort. No accessory muscle use.  Cardiovascular: Regular rate and rhythm, no murmurs / rubs / gallops.  1+ bilateral pitting edema positive. 2+ pedal pulses. No carotid bruits.  Abdomen: no tenderness, no masses palpated. No hepatosplenomegaly. Bowel sounds positive.  Musculoskeletal: no clubbing / cyanosis. No joint deformity upper and lower extremities. Good ROM, no contractures. Normal muscle tone.  Skin: no rashes, lesions, ulcers. No induration Neurologic: CN 2-12 grossly intact. Sensation intact, DTR normal. Strength 5/5 in all 4.  Psychiatric: Normal judgment and insight. Alert and oriented x 3. Normal mood.    Labs on Admission: I have personally reviewed  following labs and imaging studies  CBC: Recent Labs  Lab 06/22/19 0456  WBC 5.3  HGB 15.7  HCT 47.3  MCV 98.1  PLT 99991111   Basic Metabolic Panel: Recent Labs  Lab 06/22/19 0456  NA 136  K 4.0  CL 105  CO2 25  GLUCOSE 102*  BUN 23*  CREATININE 1.21  CALCIUM 8.6*   GFR: Estimated Creatinine Clearance: 91.8 mL/min (by C-G formula based on SCr of 1.21 mg/dL). Liver Function Tests: No results for input(s): AST, ALT, ALKPHOS, BILITOT, PROT, ALBUMIN in the last 168 hours. No results for input(s): LIPASE, AMYLASE in the last 168 hours. No results for input(s): AMMONIA in the last 168 hours. Coagulation Profile: No results for input(s): INR, PROTIME in the last 168 hours. Cardiac Enzymes: No results for input(s): CKTOTAL, CKMB, CKMBINDEX, TROPONINI in the last 168 hours. BNP (last 3 results) No results for input(s): PROBNP in the last 8760 hours. HbA1C: No results for  input(s): HGBA1C in the last 72 hours. CBG: No results for input(s): GLUCAP in the last 168 hours. Lipid Profile: No results for input(s): CHOL, HDL, LDLCALC, TRIG, CHOLHDL, LDLDIRECT in the last 72 hours. Thyroid Function Tests: No results for input(s): TSH, T4TOTAL, FREET4, T3FREE, THYROIDAB in the last 72 hours. Anemia Panel: No results for input(s): VITAMINB12, FOLATE, FERRITIN, TIBC, IRON, RETICCTPCT in the last 72 hours. Urine analysis:    Component Value Date/Time   COLORURINE YELLOW 09/06/2014 [place] 09/06/2014 0242   LABSPEC 1.030 09/06/2014 0242   PHURINE 5.5 09/06/2014 0242   GLUCOSEU NEGATIVE 09/06/2014 0242   HGBUR LARGE (A) 09/06/2014 0242   BILIRUBINUR NEGATIVE 09/06/2014 0242   KETONESUR 15 (A) 09/06/2014 0242   PROTEINUR NEGATIVE 09/06/2014 0242   UROBILINOGEN 0.2 09/06/2014 0242   NITRITE NEGATIVE 09/06/2014 0242   LEUKOCYTESUR NEGATIVE 09/06/2014 0242    Radiological Exams on Admission: DG Chest 2 View  Result Date: 06/22/2019 CLINICAL DATA:  Chest pain EXAM:  CHEST - 2 VIEW COMPARISON:  03/08/2014 FINDINGS: Normal heart size and mediastinal contours. No acute infiltrate or edema. No effusion or pneumothorax. No acute osseous findings. IMPRESSION: Stable chest.  No visible active disease. Electronically Signed   By: Monte Fantasia M.D.   On: 06/22/2019 04:22    EKG: Sinus rhythm, minimal ST elevation in anterior leads.  Assessment/Plan Principal Problem:   Chest pain Active Problems:   OSA (obstructive sleep apnea)   Hypothyroidism   Morbid obesity (HCC)   CAD (coronary artery disease)   Hyperlipidemia   GERD (gastroesophageal reflux disease)   Chest pain rule out ACS -Risk factors including hyperlipidemia, obesity, OSA, previous history of CAD -Admit patient under observation. -Troponin x2 -.  Reviewed EKG.  Chest x-ray is negative.  COVID-19 is pending. -Continue heparin drip, start on nitro and morphine as needed for chest pain.  Zofran as needed for nausea and vomiting. -Check troponin, lipid panel, A1c -Consulted cardiology -He may need outpatient/inpatient stress test -Continue aspirin, statin and metoprolol  Hypothyroidism: Check TSH -Continue levothyroxine  Hyperlipidemia: Check lipid panel -Continue statin  GERD: Continue PPI  Obstructive sleep apnea on CPAP -Patient's family will bring CPAP from home  Depression/anxiety: Continue Celexa and Xanax as needed  Obesity with BMI of 36.  Patient likely get benefit from dietary modification, exercise and weight loss.  BPH: Continue Flomax  DVT prophylaxis: On IV heparin, SCD/TED  code Status: Full code  family Communication: None present at bedside.  Plan of care discussed with patient in length and he verbalized understanding and agreed with it. Disposition Plan: To be determined Consults called: Cardiology Admission status: Observation  Mckinley Jewel MD [facility] Pager [phone]  If 7PM-7AM, please contact night-coverage [email or web address] Password  [email or web address]  06/22/2019, 2:12 PM

## 2019-06-22 NOTE — Consult Note (Addendum)
Cardiology Consultation:   Patient ID: Jerry Keller MRN: [id number]; DOB: Feb 04, 1961  Admit date: 06/22/2019 Date of Consult: 06/22/2019  Primary Care Provider: Christain Sacramento, MD Primary Cardiologist: Dr. Gwenlyn Found64) Primary Electrophysiologist:  None   Patient Profile:   Jerry Keller is a 59 y.o. male with a hx of nonobstructive CAD on cath 2015, HL, OSA, obesity who is being seen today for the evaluation of chest pain at the request of Dr. Doristine Bosworth.  History of Present Illness:   Jerry Keller is a 59 yo male with PMH noted above. He was seen by cardiology back in 03/2014 with chest discomfort and admitted. Underwent cardiac cath which noted mild nonobstructive disease, 20% in m/d LAD, and 20% in p/mRCA. Medical therapy was recommended. He was placed on ASA and statin therapy. He was seen back in the office several weeks later with Jerry Keller and reported he had stopped the statin and preferred to try diet and exercise first. Reported he was still having episodes of chest pain associated with stress.   He presented to the [facility] ED with left sided chest pain that radiates into his left arm, back and neck with associated shortness of breath and mild LE edema. Reports he was trying heating pads for the past couple of weeks but pain intensified over the past 2 days and he was awoken in the middle of the night. Has had several episodes of chest pain throughout the day today. These have been responsive to SL nitro.   In the ED his labs showed stable electrolytes, Cr 1.21, WBC 5.3, Hgb 15.7, hsTn 6>>5>>7. LDL 129. EKG showed SR with slight ST elevation in anterior leads (unchanged from prior EKGs). CXR neg. He was transferred to [facility] for further management.   Heart Pathway Score:  HEAR Score: 4  Past Medical History:  Diagnosis Date  . GERD (gastroesophageal reflux disease)   . Headache, cluster   . Hyperlipidemia    a. Mild - statin started 02/2014.  Marland Kitchen Hypothyroidism   . Non-obstructive CAD    a. 02/2014 Cath: LM nl, LAD 8m/d, LCX nl, RCA 20p/m, EF 55-60%-->Med Rx.  . Palpitations   . Sleep apnea     Past Surgical History:  Procedure Laterality Date  . COLONOSCOPY N/A 07/30/2013   Procedure: COLONOSCOPY;  Surgeon: Beryle Beams, MD;  Location: [facility] ENDOSCOPY;  Service: Endoscopy;  Laterality: N/A;  . LEFT HEART CATHETERIZATION WITH CORONARY ANGIOGRAM N/A 03/08/2014   Procedure: LEFT HEART CATHETERIZATION WITH CORONARY ANGIOGRAM;  Surgeon: Burnell Blanks, MD;  Location: [facility] CATH LAB;  Service: Cardiovascular;  Laterality: N/A;  . TONSILLECTOMY AND ADENOIDECTOMY     as a child    Home Medications:  Prior to Admission medications   Medication Sig Start Date End Date Taking? Authorizing Provider  ALPRAZolam Duanne Moron) 0.5 MG tablet Take 0.5 mg by mouth See admin instructions. Take 0.5 mg by mouth in the morning and 0.5 mg at bedtime 03/12/14  Yes [provider]  Ascorbic Acid (VITAMIN C GUMMIES) 125 MG CHEW Chew 500 mg by mouth daily.   Yes [provider]  aspirin EC 81 MG tablet Take 81 mg by mouth daily.   Yes [provider]  Cholecalciferol (VITAMIN D3) 50 MCG (2000 UT) TABS Take 2,000 Units by mouth daily after breakfast.   Yes [provider]  citalopram (CELEXA) 20 MG tablet Take 20 mg by mouth daily.   Yes [provider]  esomeprazole (NEXIUM 24HR) 20 MG capsule Take 20  mg by mouth daily as needed (for reflux).   Yes [provider]  fluticasone (FLONASE) 50 MCG/ACT nasal spray Place 1-2 sprays into both nostrils at bedtime as needed (congestion).  06/14/16  Yes [provider]  folic acid (FOLVITE) [id number] MCG tablet Take 400 mcg by mouth daily.   Yes [provider]  HYDROcodone-acetaminophen (NORCO/VICODIN) 5-325 MG per tablet Take 1 tablet by mouth at bedtime.    Yes [provider]  ibuprofen (ADVIL) 200 MG tablet Take 600 mg by mouth every 6 (six) hours as needed for headache or mild pain.    Yes [provider]  levothyroxine (SYNTHROID) 100 MCG tablet Take 100 mcg by mouth daily before breakfast.   Yes [provider]  Magnesium 250 MG TABS Take 250 mg by mouth daily.    Yes [provider]  metoprolol tartrate (LOPRESSOR) 50 MG tablet Take 50 mg by mouth in the morning.   Yes [provider]  PRESCRIPTION MEDICATION See admin instructions. CPAP- At bedtime and during naps   Yes [provider]  tamsulosin (FLOMAX) 0.4 MG CAPS capsule Take 0.4 mg by mouth daily after breakfast.   Yes [provider]  testosterone cypionate (DEPOTESTOTERONE CYPIONATE) 200 MG/ML injection Inject 100 mg into the muscle every Monday.    Yes [provider]  pantoprazole (PROTONIX) 40 MG tablet Take 1 tablet (40 mg total) by mouth daily. Patient not taking: Reported on 06/22/2019 03/09/14   Theora Gianotti, NP    Inpatient Medications: Scheduled Meds: . aspirin EC  81 mg Oral Daily  . atorvastatin  80 mg Oral q1800   Continuous Infusions: . sodium chloride 10 mL/hr at 06/22/19 0718  . heparin 1,200 Units/hr (06/22/19 0719)   PRN Meds: sodium chloride, acetaminophen **OR** acetaminophen, fentaNYL (SUBLIMAZE) injection, morphine injection, nitroGLYCERIN, ondansetron **OR** ondansetron (ZOFRAN) IV  Allergies:   No Known Allergies  Social History:   Social History   Socioeconomic History  . Marital status: Married    Spouse name: Not on file  . Number of children: Not on file  . Years of education: Not on file  . Highest education level: Not on file  Occupational History  . Occupation: maintaince    Employer: [employer]  Tobacco Use  . Smoking status: Former Smoker    Packs/day: 1.00    Years: 6.00    Pack years: 6.00    Types: Cigarettes    Quit date: 04/15/2000    Years since quitting: 19.1  . Smokeless tobacco: Never Used  Substance and Sexual Activity  . Alcohol use: Yes    Comment: socially  . Drug use: No    . Sexual activity: Yes  Other Topics Concern  . Not on file  Social History Narrative  . Not on file   Social Determinants of Health   Financial Resource Strain:   . Difficulty of Paying Living Expenses: Not on file  Food Insecurity:   . Worried About Charity fundraiser in the Last Year: Not on file  . Ran Out of Food in the Last Year: Not on file  Transportation Needs:   . Lack of Transportation (Medical): Not on file  . Lack of Transportation (Non-Medical): Not on file  Physical Activity:   . Days of Exercise per Week: Not on file  . Minutes of Exercise per Session: Not on file  Stress:   . Feeling of Stress : Not on file  Social Connections:   . Frequency of  Communication with Friends and Family: Not on file  . Frequency of Social Gatherings with Friends and Family: Not on file  . Attends Religious Services: Not on file  . Active Member of Clubs or Organizations: Not on file  . Attends Archivist Meetings: Not on file  . Marital Status: Not on file  Intimate Partner Violence:   . Fear of Current or Ex-Partner: Not on file  . Emotionally Abused: Not on file  . Physically Abused: Not on file  . Sexually Abused: Not on file    Family History:    Family History  Problem Relation Age of Onset  . Hypertension Mother   . Hyperlipidemia Father   . Hypertension Father   . Prostate cancer Father   . Sleep apnea Father   . Heart attack Maternal Uncle 51  . Heart attack Maternal Grandfather 57     ROS:  Please see the history of present illness.   All other ROS reviewed and negative.     Physical Exam/Data:   Vitals:   06/22/19 1059 06/22/19 1231 06/22/19 1414 06/22/19 1611  BP: 114/66 133/78 129/75 130/78  Pulse: 64 (!) 58 (!) 56 60  Resp: 18 16 17 18   Temp:   97.7 F (36.5 C) 99.4 F (37.4 C)  TempSrc:   Oral Oral  SpO2: 98% 96% 96% 97%  Weight:   121.7 kg   Height:   6' (1.829 m)    No intake or output data in the 24 hours ending 06/22/19  1632 Last 3 Weights 06/22/2019 06/22/2019 05/06/2018  Weight (lbs) 268 lb 4.8 oz 268 lb 261 lb  Weight (kg) 121.7 kg 121.564 kg 118.389 kg     Body mass index is 36.39 kg/m.  General:  Well nourished, well developed, in no acute distress HEENT: normal Lymph: no adenopathy Neck: no JVD Endocrine:  No thryomegaly Vascular: No carotid bruits; FA pulses 2+ bilaterally without bruits  Cardiac:  normal S1, S2; RRR; no murmur  Lungs:  clear to auscultation bilaterally, no wheezing, rhonchi or rales  Abd: soft, nontender, no hepatomegaly  Ext: no edema Musculoskeletal:  No deformities, BUE and BLE strength normal and equal Skin: warm and dry  Neuro:  CNs 2-12 intact, no focal abnormalities noted Psych:  Normal affect   EKG:  The EKG was personally reviewed and demonstrates:   SR with slight ST elevation in anterior leads (unchanged from prior EKGs)  Relevant CV Studies:  Cath: 02/2014  Angiographic Findings:  Left main: No obstructive disease.   Left Anterior Descending Artery: Large caliber vessel that courses to the apex. There is a 20% mid stenosis and 20% distal stenosis. The first diagonal branch is moderate in caliber with no obstructive disease.   Circumflex Artery:Large caliber vessel with termination into a large obtuse marginal branch. No obstructive disease.   Right Coronary Artery: Large dominant vessel with proximal 20% stenosis, serial 20% stenoses in the mid vessel.   Left Ventricular Angiogram: LVEF=55-60%  Impression: 1. Mild non-obstructive CAD 2. Normal LV systolic function 3. Non-cardiac chest pain  Recommendations: He will need treatment for his CAD with a statin. He may need formal GI evaluation for ongoing chest pain. I will change his Protonix to 40 mg po BID. GI cocktail tonight. Would consult GI in the am.          Laboratory Data:  High Sensitivity Troponin:   Recent Labs  Lab 06/22/19 0458 06/22/19 0610 06/22/19 1439  TROPONINIHS 6 5 7  Chemistry Recent Labs  Lab 06/22/19 0456  NA 136  K 4.0  CL 105  CO2 25  GLUCOSE 102*  BUN 23*  CREATININE 1.21  CALCIUM 8.6*  GFRNONAA >60  GFRAA >60  ANIONGAP 6    No results for input(s): PROT, ALBUMIN, AST, ALT, ALKPHOS, BILITOT in the last 168 hours. Hematology Recent Labs  Lab 06/22/19 0456  WBC 5.3  RBC 4.82  HGB 15.7  HCT 47.3  MCV 98.1  MCH 32.6  MCHC 33.2  RDW 12.1  PLT 191   BNPNo results for input(s): BNP, PROBNP in the last 168 hours.  DDimer No results for input(s): DDIMER in the last 168 hours.   Radiology/Studies:  DG Chest 2 View  Result Date: 06/22/2019 CLINICAL DATA:  Chest pain EXAM: CHEST - 2 VIEW COMPARISON:  03/08/2014 FINDINGS: Normal heart size and mediastinal contours. No acute infiltrate or edema. No effusion or pneumothorax. No acute osseous findings. IMPRESSION: Stable chest.  No visible active disease. Electronically Signed   By: Monte Fantasia M.D.   On: 06/22/2019 04:22   HEAR Score (for undifferentiated chest pain):  HEAR Score: 4    Assessment and Plan:   Jerry Keller is a 59 y.o. male with a hx of nonobstructive CAD on cath 2015, HL, OSA, obesity who is being seen today for the evaluation of chest pain at the request of Dr. Doristine Bosworth.  1. Chest pain: has been having intermittent episodes of chest pain through to his back, neck and arms for several weeks. Episodes have recently woken him from sleep. Presented to [facility] today and has had several episodes while in the ED waiting for transfer to [facility]. hsTn are negative thus far. EKG non acute. Does have a hx of mild nonobstructive disease from 2015 cath. With progressive symptoms responsive to nitro will need further evaluation. Review with MD CT vs cardiac cath.  -- add nitro paste -- remains on IV heparin  2. HL: reports stopping statin only after a few doses and trying diet and exercise. LDL above goal. -- add atorva 40mg   For questions or updates, please contact [place] Please consult [email or web address] for contact info under   Signed, Reino Bellis, NP  06/22/2019 4:32 PM   The patient was seen, examined and discussed with Reino Bellis, NP-C and I agree with the above.   59 yo male with PMH of hyperlipidemia, OSA and non-obstructive CAD who presented with progressively worsening DOE and chest pain - pressure like associated with nausea. Troponin is negative x 2, ECG unchanged. We will plan for a cardiac cath in the am. Start ASA 81 mg po daily, atorvastatin 80 mg po daily, bradycardia at baseline, BP controlled.  Ena Dawley, MD 06/22/2019

## 2019-06-22 NOTE — Progress Notes (Signed)
ANTICOAGULATION CONSULT NOTE - Initial Consult  Pharmacy Consult for heparin Indication: chest pain/ACS  No Known Allergies  Patient Measurements: Height: 6' (182.9 cm) Weight: 268 lb (121.6 kg) IBW/kg (Calculated) : 77.6 Heparin Dosing Weight: 104.4 kg  Vital Signs: Temp: 97.6 F (36.4 C) (03/09 0351) Temp Source: Oral (03/09 0351) BP: 115/69 (03/09 0623) Pulse Rate: 63 (03/09 0623)  Labs: Recent Labs    06/22/19 0456 06/22/19 0458 06/22/19 0610  HGB 15.7  --   --   HCT 47.3  --   --   PLT 191  --   --   CREATININE 1.21  --   --   TROPONINIHS  --  6 5    Estimated Creatinine Clearance: 91.8 mL/min (by C-G formula based on SCr of 1.21 mg/dL).   Medical History: Past Medical History:  Diagnosis Date  . GERD (gastroesophageal reflux disease)   . Headache, cluster   . Hyperlipidemia    a. Mild - statin started 02/2014.  Marland Kitchen Hypothyroidism   . Non-obstructive CAD    a. 02/2014 Cath: LM nl, LAD 65m/d, LCX nl, RCA 20p/m, EF 55-60%-->Med Rx.  . Palpitations   . Sleep apnea     Medications:  See medication history  Assessment: 59 yo man to start heparin for CP.  He was not on anticoagulation PTA. Goal of Therapy:  Heparin level 0.3-0.7 units/ml Monitor platelets by anticoagulation protocol: Yes   Plan:  Heparin 4000 unit bolus and drip at 1200 units/hr Check heparin level 6-8 hours after start Daily HL and CBC daily Monitor for bleeding complications  Jerry Keller 06/22/2019,7:02 AM

## 2019-06-22 NOTE — Progress Notes (Addendum)
Pt refused q6h nitro d/t severe headache. MD notified. No chest pain noted. Will continue monitoring.

## 2019-06-22 NOTE — Progress Notes (Signed)
ANTICOAGULATION CONSULT NOTE - Initial Consult  Pharmacy Consult for heparin Indication: chest pain/ACS  No Known Allergies  Patient Measurements: Height: 6' (182.9 cm) Weight: 268 lb 4.8 oz (121.7 kg) IBW/kg (Calculated) : 77.6 Heparin Dosing Weight: 104.4 kg  Vital Signs: Temp: 99.4 F (37.4 C) (03/09 1611) Temp Source: Oral (03/09 1611) BP: 115/70 (03/09 1718) Pulse Rate: 60 (03/09 1611)  Labs: Recent Labs    06/22/19 0456 06/22/19 0458 06/22/19 0610 06/22/19 1439 06/22/19 1450  HGB 15.7  --   --   --   --   HCT 47.3  --   --   --   --   PLT 191  --   --   --   --   HEPARINUNFRC  --   --   --   --  0.35  CREATININE 1.21  --   --   --   --   TROPONINIHS  --  6 5 7   --     Estimated Creatinine Clearance: 91.8 mL/min (by C-G formula based on SCr of 1.21 mg/dL).   Medical History: Past Medical History:  Diagnosis Date  . GERD (gastroesophageal reflux disease)   . Headache, cluster   . Hyperlipidemia    a. Mild - statin started 02/2014.  Marland Kitchen Hypothyroidism   . Non-obstructive CAD    a. 02/2014 Cath: LM nl, LAD 58m/d, LCX nl, RCA 20p/m, EF 55-60%-->Med Rx.  . Palpitations   . Sleep apnea     Assessment: 59 yr old male started on heparin for left-sided CP.  He was not on anticoagulation PTA. Medical hx includes: HLD, OSA (CPAP), obesity, GERD, hypothyroidism, CAD (cath in 2015 showed mild CAD).   Troponins negative thus far; ECG non acute. Patient will need further evaluation, due to progressive symptoms responsive to NTG (Cardiology recommendations pending).  Heparin level ~7.5 hrs after heparin 4000 units IV bolus, followed by heparin infusion at 1200 units/hr, was 0.36 units/ml, which is within the goal range for this pt. CBC WNL. Per RN, no issues with IV or bleeding observed.  Goal of Therapy:  Heparin level 0.3-0.7 units/ml Monitor platelets by anticoagulation protocol: Yes   Plan:  Continue heparin infusion at 1200 units/hr Check confirmatory heparin  level in 6 hrs Monitor daily heparin level, CBC Monitor for signs/symptoms of bleeding  Gillermina Hu, PharmD, BCPS, [facility] Clinical Pharmacist 06/22/2019,5:28 PM

## 2019-06-23 ENCOUNTER — Observation Stay (HOSPITAL_BASED_OUTPATIENT_CLINIC_OR_DEPARTMENT_OTHER): Payer: BC Managed Care – PPO

## 2019-06-23 ENCOUNTER — Encounter (HOSPITAL_COMMUNITY)
Admission: EM | Disposition: A | Discharge status: Home / Self Care | Payer: Self-pay | Source: Home / Self Care | Attending: Emergency Medicine

## 2019-06-23 DIAGNOSIS — I25119 Atherosclerotic heart disease of native coronary artery with unspecified angina pectoris: Secondary | ICD-10-CM | POA: Diagnosis not present

## 2019-06-23 DIAGNOSIS — I25110 Atherosclerotic heart disease of native coronary artery with unstable angina pectoris: Secondary | ICD-10-CM | POA: Diagnosis not present

## 2019-06-23 DIAGNOSIS — E039 Hypothyroidism, unspecified: Secondary | ICD-10-CM | POA: Diagnosis not present

## 2019-06-23 DIAGNOSIS — K219 Gastro-esophageal reflux disease without esophagitis: Secondary | ICD-10-CM | POA: Diagnosis not present

## 2019-06-23 DIAGNOSIS — R0789 Other chest pain: Secondary | ICD-10-CM | POA: Diagnosis not present

## 2019-06-23 DIAGNOSIS — E785 Hyperlipidemia, unspecified: Secondary | ICD-10-CM

## 2019-06-23 DIAGNOSIS — Z20822 Contact with and (suspected) exposure to covid-19: Secondary | ICD-10-CM | POA: Diagnosis not present

## 2019-06-23 DIAGNOSIS — G4733 Obstructive sleep apnea (adult) (pediatric): Secondary | ICD-10-CM

## 2019-06-23 DIAGNOSIS — I200 Unstable angina: Secondary | ICD-10-CM | POA: Diagnosis not present

## 2019-06-23 DIAGNOSIS — R079 Chest pain, unspecified: Secondary | ICD-10-CM | POA: Diagnosis not present

## 2019-06-23 HISTORY — PX: LEFT HEART CATH AND CORONARY ANGIOGRAPHY: CATH118249

## 2019-06-23 LAB — ECHOCARDIOGRAM COMPLETE
Height: 72 in
Weight: 4281.6 oz

## 2019-06-23 LAB — CBC
HCT: 46.8 % (ref 39.0–52.0)
Hemoglobin: 15.5 g/dL (ref 13.0–17.0)
MCH: 32.2 pg (ref 26.0–34.0)
MCHC: 33.1 g/dL (ref 30.0–36.0)
MCV: 97.3 fL (ref 80.0–100.0)
Platelets: 184 10*3/uL (ref 150–400)
RBC: 4.81 MIL/uL (ref 4.22–5.81)
RDW: 12.1 % (ref 11.5–15.5)
WBC: 6.4 10*3/uL (ref 4.0–10.5)
nRBC: 0 % (ref 0.0–0.2)

## 2019-06-23 LAB — BASIC METABOLIC PANEL
Anion gap: 9 (ref 5–15)
BUN: 17 mg/dL (ref 6–20)
CO2: 21 mmol/L — ABNORMAL LOW (ref 22–32)
Calcium: 8.5 mg/dL — ABNORMAL LOW (ref 8.9–10.3)
Chloride: 108 mmol/L (ref 98–111)
Creatinine, Ser: 1.09 mg/dL (ref 0.61–1.24)
GFR calc Af Amer: >60 mL/min (ref >60)
GFR calc non Af Amer: >60 mL/min (ref >60)
Glucose, Bld: 112 mg/dL — ABNORMAL HIGH (ref 70–99)
Potassium: 4 mmol/L (ref 3.5–5.1)
Sodium: 138 mmol/L (ref 135–145)

## 2019-06-23 LAB — HEPARIN LEVEL (UNFRACTIONATED)
Heparin Unfractionated: 0.19 IU/mL — ABNORMAL LOW (ref 0.30–0.70)
Heparin Unfractionated: 0.43 IU/mL (ref 0.30–0.70)

## 2019-06-23 SURGERY — LEFT HEART CATH AND CORONARY ANGIOGRAPHY
Anesthesia: LOCAL

## 2019-06-23 MED ORDER — FENTANYL CITRATE (PF) 100 MCG/2ML IJ SOLN
INTRAMUSCULAR | Status: AC
  Filled 2019-06-23: qty 2

## 2019-06-23 MED ORDER — SODIUM CHLORIDE 0.9 % WEIGHT BASED INFUSION: 1.0000 mL/kg/h | INTRAVENOUS | Status: DC

## 2019-06-23 MED ORDER — VERAPAMIL HCL 2.5 MG/ML IV SOLN
INTRAVENOUS | Status: AC
  Filled 2019-06-23: qty 2

## 2019-06-23 MED ORDER — HEPARIN (PORCINE) IN NACL 1000-0.9 UT/500ML-% IV SOLN
INTRAVENOUS | Status: AC
  Filled 2019-06-23: qty 1000

## 2019-06-23 MED ORDER — HEPARIN SODIUM (PORCINE) 1000 UNIT/ML IJ SOLN
INTRAMUSCULAR | Status: DC | PRN
  Administered 2019-06-23: 5000 [IU] via INTRAVENOUS

## 2019-06-23 MED ORDER — LABETALOL HCL 5 MG/ML IV SOLN: 10.0000 mg | INTRAVENOUS | Status: DC | PRN

## 2019-06-23 MED ORDER — MIDAZOLAM HCL 2 MG/2ML IJ SOLN
INTRAMUSCULAR | Status: DC | PRN
  Administered 2019-06-23: 2 mg via INTRAVENOUS

## 2019-06-23 MED ORDER — SODIUM CHLORIDE 0.9% FLUSH: 3.0000 mL | INTRAVENOUS | Status: DC | PRN

## 2019-06-23 MED ORDER — SODIUM CHLORIDE 0.9% FLUSH: 3.0000 mL | Freq: Two times a day (BID) | INTRAVENOUS | Status: DC

## 2019-06-23 MED ORDER — HEPARIN BOLUS VIA INFUSION
1000.0000 [IU] | Freq: Once | INTRAVENOUS | Status: AC
  Administered 2019-06-23: 1000 [IU] via INTRAVENOUS
  Filled 2019-06-23: qty 1000

## 2019-06-23 MED ORDER — SODIUM CHLORIDE 0.9 % IV SOLN: 250.0000 mL | INTRAVENOUS | Status: DC | PRN

## 2019-06-23 MED ORDER — ATORVASTATIN CALCIUM 80 MG PO TABS: 80.0000 mg | ORAL_TABLET | Freq: Every day | ORAL | 1 refills | Status: DC

## 2019-06-23 MED ORDER — FENTANYL CITRATE (PF) 100 MCG/2ML IJ SOLN
INTRAMUSCULAR | Status: DC | PRN
  Administered 2019-06-23: 25 ug via INTRAVENOUS

## 2019-06-23 MED ORDER — LIDOCAINE HCL (PF) 1 % IJ SOLN
INTRAMUSCULAR | Status: AC
  Filled 2019-06-23: qty 30

## 2019-06-23 MED ORDER — VERAPAMIL HCL 2.5 MG/ML IV SOLN
INTRAVENOUS | Status: DC | PRN
  Administered 2019-06-23: 10 mL via INTRA_ARTERIAL

## 2019-06-23 MED ORDER — LIDOCAINE HCL (PF) 1 % IJ SOLN
INTRAMUSCULAR | Status: DC | PRN
  Administered 2019-06-23: 2 mL

## 2019-06-23 MED ORDER — HEPARIN (PORCINE) IN NACL 1000-0.9 UT/500ML-% IV SOLN
INTRAVENOUS | Status: DC | PRN
  Administered 2019-06-23 (×4): 500 mL

## 2019-06-23 MED ORDER — MIDAZOLAM HCL 2 MG/2ML IJ SOLN
INTRAMUSCULAR | Status: AC
  Filled 2019-06-23: qty 2

## 2019-06-23 MED ORDER — HEPARIN SODIUM (PORCINE) 1000 UNIT/ML IJ SOLN
INTRAMUSCULAR | Status: AC
  Filled 2019-06-23: qty 1

## 2019-06-23 MED ORDER — IOHEXOL 350 MG/ML SOLN
INTRAVENOUS | Status: DC | PRN
  Administered 2019-06-23: 40 mL

## 2019-06-23 MED ORDER — HYDRALAZINE HCL 20 MG/ML IJ SOLN: 10.0000 mg | INTRAMUSCULAR | Status: DC | PRN

## 2019-06-23 SURGICAL SUPPLY — 9 items

## 2019-06-23 NOTE — Progress Notes (Signed)
  Echocardiogram 2D Echocardiogram has been performed.  Jerry Keller 06/23/2019, 11:57 AM

## 2019-06-23 NOTE — Interval H&P Note (Signed)
History and Physical Interval Note:  06/23/2019 3:24 PM  [name]  has presented today for surgery, with the diagnosis of unstable angina.  The various methods of treatment have been discussed with the patient and family. After consideration of risks, benefits and other options for treatment, the patient has consented to  Procedure(s): LEFT HEART CATH AND CORONARY ANGIOGRAPHY (N/A) as a surgical intervention.  The patient's history has been reviewed, patient examined, no change in status, stable for surgery.  I have reviewed the patient's chart and labs.  Questions were answered to the patient's satisfaction.     [name]

## 2019-06-23 NOTE — H&P (View-Only) (Signed)
Progress Note  Patient Name: Jerry Keller Date of Encounter: 06/23/2019  Primary Cardiologist: Quay Burow, MD   Subjective   Plan for cath today. Patient has not had further chest pain. The Nitro paste caused headache last night.    Inpatient Medications    Scheduled Meds: . ALPRAZolam  0.5 mg Oral BID  . [START ON 06/24/2019] aspirin EC  81 mg Oral Daily  . atorvastatin  80 mg Oral q1800  . citalopram  20 mg Oral Daily  . folic acid  [id number] mcg Oral Daily  . levothyroxine  100 mcg Oral QAC breakfast  . metoprolol tartrate  50 mg Oral q AM  . nitroGLYCERIN  1 inch Topical Q6H  . pantoprazole  40 mg Oral Daily  . sodium chloride flush  3 mL Intravenous Q12H  . tamsulosin  0.4 mg Oral QPC breakfast   Continuous Infusions: . sodium chloride Stopped (06/22/19 1253)  . sodium chloride    . sodium chloride 1 mL/kg/hr (06/23/19 1029)  . heparin 1,400 Units/hr (06/23/19 0106)   PRN Meds: sodium chloride, sodium chloride, acetaminophen **OR** acetaminophen, fentaNYL (SUBLIMAZE) injection, morphine injection, nitroGLYCERIN, ondansetron **OR** ondansetron (ZOFRAN) IV, sodium chloride flush   Vital Signs    Vitals:   06/22/19 2020 06/23/19 0444 06/23/19 0845 06/23/19 1004  BP: 124/72 114/81 136/82 124/86  Pulse: (!) 58 63 67 (!) 59  Resp: 20 16  20   Temp: 98.1 F (36.7 C) 97.8 F (36.6 C)  97.8 F (36.6 C)  TempSrc: Oral Oral  Oral  SpO2: 95% 96%  98%  Weight:  121.4 kg    Height:        Intake/Output Summary (Last 24 hours) at 06/23/2019 1047 Last data filed at 06/23/2019 0600 Gross per 24 hour  Intake 720.99 ml  Output --  Net 720.99 ml   Last 3 Weights 06/23/2019 06/22/2019 06/22/2019  Weight (lbs) 267 lb 9.6 oz 268 lb 4.8 oz 268 lb  Weight (kg) 121.383 kg 121.7 kg 121.564 kg      Telemetry    NSR, HR 50-60s - Personally Reviewed  ECG    Pending - Personally Reviewed  Physical Exam   GEN: No acute distress.   Neck: No JVD Cardiac: RRR, no murmurs,  rubs, or gallops.  Respiratory: Clear to auscultation bilaterally. GI: Soft, nontender, non-distended  MS: No edema; No deformity. Neuro:  Nonfocal  Psych: Normal affect   Labs    High Sensitivity Troponin:   Recent Labs  Lab 06/22/19 0458 06/22/19 0610 06/22/19 1439  TROPONINIHS 6 5 7       Chemistry Recent Labs  Lab 06/22/19 0456 06/23/19 0440  NA 136 138  K 4.0 4.0  CL 105 108  CO2 25 21*  GLUCOSE 102* 112*  BUN 23* 17  CREATININE 1.21 1.09  CALCIUM 8.6* 8.5*  GFRNONAA >60 >60  GFRAA >60 >60  ANIONGAP 6 9     Hematology Recent Labs  Lab 06/22/19 0456 06/23/19 0440  WBC 5.3 6.4  RBC 4.82 4.81  HGB 15.7 15.5  HCT 47.3 46.8  MCV 98.1 97.3  MCH 32.6 32.2  MCHC 33.2 33.1  RDW 12.1 12.1  PLT 191 184    BNPNo results for input(s): BNP, PROBNP in the last 168 hours.   DDimer No results for input(s): DDIMER in the last 168 hours.   Radiology    DG Chest 2 View  Result Date: 06/22/2019 CLINICAL DATA:  Chest pain EXAM: CHEST - 2 VIEW COMPARISON:  03/08/2014 FINDINGS: Normal heart size and mediastinal contours. No acute infiltrate or edema. No effusion or pneumothorax. No acute osseous findings. IMPRESSION: Stable chest.  No visible active disease. Electronically Signed   By: Monte Fantasia M.D.   On: 06/22/2019 04:22    Cardiac Studies   Cath: 02/2014  Angiographic Findings:  Left main: No obstructive disease.   Left Anterior Descending Artery: Large caliber vessel that courses to the apex. There is a 20% mid stenosis and 20% distal stenosis. The first diagonal branch is moderate in caliber with no obstructive disease.   Circumflex Artery:Large caliber vessel with termination into a large obtuse marginal branch. No obstructive disease.   Right Coronary Artery: Large dominant vessel with proximal 20% stenosis, serial 20% stenoses in the mid vessel.   Left Ventricular Angiogram: LVEF=55-60%  Impression: 1. Mild non-obstructive CAD 2. Normal  LV systolic function 3. Non-cardiac chest pain  Recommendations: He will need treatment for his CAD with a statin. He may need formal GI evaluation for ongoing chest pain. I will change his Protonix to 40 mg po BID. GI cocktail tonight. Would consult GI in the am.   Patient Profile     59 y.o. 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   Assessment & Plan    Chest Pain Patient presented with intermittent episodes of chest pain. HS trop negative x 3. EKG nonacute. With progressive symptoms will plan for cath today.  - IV heparin  - Aspirin started - Atorvastatin 80 mg started - Lopressor 50mg  daily started - Patient has not had recurrent chest pain. Further recs per cath Risks and benefits of cardiac catheterization have been discussed with the patient.  These include bleeding, infection, kidney damage, stroke, heart attack, death.  The patient understands these risks and is willing to proceed.  HLD - LDL 129 - Atorvastatin 80 mg added  For questions or updates, please contact [place] Please consult [email or web address] for contact info under     Signed, Cadence Ninfa Meeker, PA-C  06/23/2019, 10:47 AM    The patient was seen, examined and discussed with Cadence Ninfa Meeker, PA-C   and I agree with the above.   The patient had no recurrent chest pains overnight while being at rest, he is awaiting his cardiac cath today.  Ena Dawley, MD 06/23/2019

## 2019-06-23 NOTE — Discharge Instructions (Signed)
Radial Site Care  This sheet gives you information about how to care for yourself after your procedure. Your health care provider may also give you more specific instructions. If you have problems or questions, contact your health care provider. What can I expect after the procedure? After the procedure, it is common to have:  Bruising and tenderness at the catheter insertion area. Follow these instructions at home: Medicines  Take over-the-counter and prescription medicines only as told by your health care provider. Insertion site care  Follow instructions from your health care provider about how to take care of your insertion site. Make sure you: ? Wash your hands with soap and water before you change your bandage (dressing). If soap and water are not available, use hand sanitizer. ? Change your dressing as told by your health care provider. ? Leave stitches (sutures), skin glue, or adhesive strips in place. These skin closures may need to stay in place for 2 weeks or longer. If adhesive strip edges start to loosen and curl up, you may trim the loose edges. Do not remove adhesive strips completely unless your health care provider tells you to do that.  Check your insertion site every day for signs of infection. Check for: ? Redness, swelling, or pain. ? Fluid or blood. ? Pus or a bad smell. ? Warmth.  Do not take baths, swim, or use a hot tub until your health care provider approves.  You may shower 24-48 hours after the procedure, or as directed by your health care provider. ? Remove the dressing and gently wash the site with plain soap and water. ? Pat the area dry with a clean towel. ? Do not rub the site. That could cause bleeding.  Do not apply powder or lotion to the site. Activity   For 24 hours after the procedure, or as directed by your health care provider: ? Do not flex or bend the affected arm. ? Do not push or pull heavy objects with the affected arm. ? Do not  drive yourself home from the hospital or clinic. You may drive 24 hours after the procedure unless your health care provider tells you not to. ? Do not operate machinery or power tools.  Do not lift anything that is heavier than 10 lb (4.5 kg), or the limit that you are told, until your health care provider says that it is safe.  Ask your health care provider when it is okay to: ? Return to work or school. ? Resume usual physical activities or sports. ? Resume sexual activity. General instructions  If the catheter site starts to bleed, raise your arm and put firm pressure on the site. If the bleeding does not stop, get help right away. This is a medical emergency.  If you went home on the same day as your procedure, a responsible adult should be with you for the first 24 hours after you arrive home.  Keep all follow-up visits as told by your health care provider. This is important. Contact a health care provider if:  You have a fever.  You have redness, swelling, or yellow drainage around your insertion site. Get help right away if:  You have unusual pain at the radial site.  The catheter insertion area swells very fast.  The insertion area is bleeding, and the bleeding does not stop when you hold steady pressure on the area.  Your arm or hand becomes pale, cool, tingly, or numb. These symptoms may represent a serious problem   that is an emergency. Do not wait to see if the symptoms will go away. Get medical help right away. Call your local emergency services (911 in the [place] Do not drive yourself to the hospital. Summary  After the procedure, it is common to have bruising and tenderness at the site.  Follow instructions from your health care provider about how to take care of your radial site wound. Check the wound every day for signs of infection.  Do not lift anything that is heavier than 10 lb (4.5 kg), or the limit that you are told, until your health care provider says  that it is safe. This information is not intended to replace advice given to you by your health care provider. Make sure you discuss any questions you have with your health care provider. Document Revised: 05/07/2017 Document Reviewed: 05/07/2017 Elsevier Patient Education  2020 [employer]  

## 2019-06-23 NOTE — Discharge Summary (Signed)
Physician Discharge Summary  Jerry Keller [id number] DOB: 11/22/1960 DOA: 06/22/2019  PCP: Christain Sacramento, MD  Admit date: 06/22/2019 Discharge date: 06/23/2019  Admitted From: Home Disposition: Home  Recommendations for Outpatient Follow-up:  1. Follow ups as below. 2. Please obtain CBC/BMP/Mag at follow up 3. Please follow up on the following pending results: None  Home Health: None Equipment/Devices: None  Discharge Condition: Stable CODE STATUS: Full code  Follow-up Information    Christain Sacramento, MD. Schedule an appointment as soon as possible for a visit in 1 week(s).   Specialty: Family Medicine Contact information: [address] [place] [address] [phone]        Lorretta Harp, MD. Schedule an appointment as soon as possible for a visit in 2 week(s).   Specialties: Cardiology, Radiology Contact information: [address] [place] [address] [phone]           Hospital Course: 59 year old male with history of OSA on CPAP, mild CAD on cath in 2015, obesity, HLD, GERD, depression and anxiety presented to [facility] ED with chest pain, shortness of breath and edema and admitted for ACS rule out.  In ED, HDS.  Normal saturation on room air.  CBC, BMP, chest x-ray, troponin all came back within normal limits. EKG: No acute changes. Patient started on heparin and transferred to [facility] for further evaluation and management of his chest pain.  Patient was evaluated by cardiology.   Echocardiogram and left heart catheterization without significant finding. See detailed read below. Cleared for discharge by cardiology, Dr. Burt Knack.  See individual pulmonary below for more hospital course.  Discharge Diagnoses:  Chest pain with typical and atypical features-now chest pain-free but significant risk factors. HS-troponin negative x3.  EKG without acute finding.  LDL 129.  A1c 5.7%.  Echocardiogram and left heart  catheterization without significant finding. See detailed read below. -Continue aspirin, atorvastatin and Lopressor -Outpatient follow-up with primary care doctor and cardiologist in 1 to 2 weeks -Counseled on lifestyle changes including diet and exercise  Hypothyroidism: TSH normal. -Continue levothyroxine  Hyperlipidemia: LDL 129.  HDL 35. -High intensity statin  Obstructive sleep apnea on CPAP -Continue nightly CPAP-compliant.  Depression/anxiety: Stable. -Continue Celexa and Xanax as needed  Class II obesity:Body mass index is 36.29 kg/m. -Encourage lifestyle change to lose weight. -May benefit from GLP-1 inhibitors  BPH without LUTS -Continue Flomax  Tension headache-due to nitro.  No red flags. -As needed Tylenol after LHC   GERD:  -Continue PPI  Discharge Instructions  Discharge Instructions    Call MD for:  difficulty breathing, headache or visual disturbances   Complete by: As directed    Call MD for:  extreme fatigue   Complete by: As directed    Call MD for:  persistant dizziness or light-headedness   Complete by: As directed    Call MD for:  severe uncontrolled pain   Complete by: As directed    Diet - low sodium heart healthy   Complete by: As directed    Discharge instructions   Complete by: As directed    It has been a pleasure taking care of you! You were hospitalized with chest pain.  After the test is we have done, it is unlikely that your chest pain is from your heart.  However, you still have significant risk factor.  We have added new medications and/or made an adjustment to your home medications to reduce your risk factor.  We also  recommend lifestyle change such as healthy diet and exercise to reduce your risk of heart attack in the future.  Please review your new medication list and the directions before you take your medications.  Please follow-up with your primary care doctor and your heart doctor in about 1 to 2 weeks.   Take  care,   Increase activity slowly   Complete by: As directed      Allergies as of 06/23/2019   No Known Allergies     Medication List    STOP taking these medications   ibuprofen 200 MG tablet Commonly known as: ADVIL     TAKE these medications   ALPRAZolam 0.5 MG tablet Commonly known as: XANAX Take 0.5 mg by mouth See admin instructions. Take 0.5 mg by mouth in the morning and 0.5 mg at bedtime   aspirin EC 81 MG tablet Take 81 mg by mouth daily.   atorvastatin 80 MG tablet Commonly known as: LIPITOR Take 1 tablet (80 mg total) by mouth daily at 6 PM.   citalopram 20 MG tablet Commonly known as: CELEXA Take 20 mg by mouth daily.   fluticasone 50 MCG/ACT nasal spray Commonly known as: FLONASE Place 1-2 sprays into both nostrils at bedtime as needed (congestion).   folic acid A999333 MCG tablet Commonly known as: FOLVITE Take 400 mcg by mouth daily.   HYDROcodone-acetaminophen 5-325 MG tablet Commonly known as: NORCO/VICODIN Take 1 tablet by mouth at bedtime.   levothyroxine 100 MCG tablet Commonly known as: SYNTHROID Take 100 mcg by mouth daily before breakfast.   Magnesium 250 MG Tabs Take 250 mg by mouth daily.   metoprolol tartrate 50 MG tablet Commonly known as: LOPRESSOR Take 50 mg by mouth in the morning.   NexIUM 24HR 20 MG capsule Generic drug: esomeprazole Take 20 mg by mouth daily as needed (for reflux).   pantoprazole 40 MG tablet Commonly known as: PROTONIX Take 1 tablet (40 mg total) by mouth daily.   PRESCRIPTION MEDICATION See admin instructions. CPAP- At bedtime and during naps   tamsulosin 0.4 MG Caps capsule Commonly known as: FLOMAX Take 0.4 mg by mouth daily after breakfast.   testosterone cypionate 200 MG/ML injection Commonly known as: DEPOTESTOSTERONE CYPIONATE Inject 100 mg into the muscle every Monday.   Vitamin C Gummies 125 MG Chew Generic drug: Ascorbic Acid Chew 500 mg by mouth daily.   Vitamin D3 50 MCG (2000 UT)  Tabs Take 2,000 Units by mouth daily after breakfast.       Consultations:  Cardiology  Procedures/Studies:  2D Echo  1. Left ventricular ejection fraction, by estimation, is 60 to 65%. The  left ventricle has normal function. The left ventricle has no regional  wall motion abnormalities. There is mild left ventricular hypertrophy.  Left ventricular diastolic parameters  were normal.  2. Right ventricular systolic function is normal. The right ventricular  size is normal.  3. The mitral valve is grossly normal. No evidence of mitral valve  regurgitation.  4. The aortic valve is tricuspid. Aortic valve regurgitation is not  visualized.  5. The inferior vena cava is normal in size with greater than 50%  respiratory variability, suggesting right atrial pressure of 3 mmHg.    Left Heart Cath Conclusion    Prox RCA lesion is 35% stenosed.  Mid RCA to Dist RCA lesion is 25% stenosed.  Prox LAD to Mid LAD lesion is 35% stenosed.  1st Diag lesion is 30% stenosed.   1. CAD, nonobstructive 2.  Normal LV function by echo 3. Elevated LVEDP  Recommend: medical therapy, lifestyle modification, stable for DC today       DG Chest 2 View  Result Date: 06/22/2019 CLINICAL DATA:  Chest pain EXAM: CHEST - 2 VIEW COMPARISON:  03/08/2014 FINDINGS: Normal heart size and mediastinal contours. No acute infiltrate or edema. No effusion or pneumothorax. No acute osseous findings. IMPRESSION: Stable chest.  No visible active disease. Electronically Signed   By: Monte Fantasia M.D.   On: 06/22/2019 04:22   ECHOCARDIOGRAM COMPLETE  Result Date: 06/23/2019    ECHOCARDIOGRAM REPORT   Patient Name:   AMAHD BRAKEMAN Date of Exam: 06/23/2019 Medical Rec #:  [id number]      Height:       72.0 in Accession #:    [id number]     Weight:       267.6 lb Date of Birth:  1961-03-03      BSA:          2.411 m Patient Age:    59 years       BP:           124/86 mmHg Patient Gender: M              HR:            60 bpm. Exam Location:  Inpatient Procedure: 2D Echo, Cardiac Doppler and Color Doppler Indications:    R07.9* Chest pain, unspecified  History:        Patient has no prior history of Echocardiogram examinations.                 CAD; Risk Factors:Hypertension, Dyslipidemia, Sleep Apnea and                 GERD. Hypothyroidism.  Sonographer:    Jonelle Sidle Dance Referring Phys: [id number] [place]  1. Left ventricular ejection fraction, by estimation, is 60 to 65%. The left ventricle has normal function. The left ventricle has no regional wall motion abnormalities. There is mild left ventricular hypertrophy. Left ventricular diastolic parameters were normal.  2. Right ventricular systolic function is normal. The right ventricular size is normal.  3. The mitral valve is grossly normal. No evidence of mitral valve regurgitation.  4. The aortic valve is tricuspid. Aortic valve regurgitation is not visualized.  5. The inferior vena cava is normal in size with greater than 50% respiratory variability, suggesting right atrial pressure of 3 mmHg. FINDINGS  Left Ventricle: Left ventricular ejection fraction, by estimation, is 60 to 65%. The left ventricle has normal function. The left ventricle has no regional wall motion abnormalities. The left ventricular internal cavity size was normal in size. There is  mild left ventricular hypertrophy. Left ventricular diastolic parameters were normal. Right Ventricle: The right ventricular size is normal. No increase in right ventricular wall thickness. Right ventricular systolic function is normal. Left Atrium: Left atrial size was normal in size. Right Atrium: Right atrial size was normal in size. Pericardium: There is no evidence of pericardial effusion. Mitral Valve: The mitral valve is grossly normal. No evidence of mitral valve regurgitation. Tricuspid Valve: The tricuspid valve is grossly normal. Tricuspid valve regurgitation is trivial. Aortic Valve: The  aortic valve is tricuspid. Aortic valve regurgitation is not visualized. Pulmonic Valve: The pulmonic valve was grossly normal. Pulmonic valve regurgitation is not visualized. Aorta: The aortic root, ascending aorta, aortic arch and descending aorta are all structurally normal, with no evidence of dilitation or obstruction. Venous:  The inferior vena cava is normal in size with greater than 50% respiratory variability, suggesting right atrial pressure of 3 mmHg. IAS/Shunts: No atrial level shunt detected by color flow Doppler.  LEFT VENTRICLE PLAX 2D LVIDd:         4.90 cm  Diastology LVIDs:         3.20 cm  LV e' lateral:   8.38 cm/s LV PW:         1.20 cm  LV E/e' lateral: 9.7 LV IVS:        1.20 cm  LV e' medial:    8.05 cm/s LVOT diam:     2.20 cm  LV E/e' medial:  10.1 LV SV:         82 LV SV Index:   34 LVOT Area:     3.80 cm  RIGHT VENTRICLE             IVC RV Basal diam:  2.70 cm     IVC diam: 1.40 cm RV S prime:     11.90 cm/s TAPSE (M-mode): 2.1 cm LEFT ATRIUM         Index LA diam:    4.20 cm 1.74 cm/m  AORTIC VALVE LVOT Vmax:   94.20 cm/s LVOT Vmean:  60.900 cm/s LVOT VTI:    0.217 m  AORTA Ao Root diam: 3.30 cm Ao Asc diam:  3.30 cm MITRAL VALVE MV Area (PHT): 3.99 cm    SHUNTS MV Decel Time: 190 msec    Systemic VTI:  0.22 m MV E velocity: 81.50 cm/s  Systemic Diam: 2.20 cm MV A velocity: 45.60 cm/s MV E/A ratio:  1.79 Lyman Bishop MD Electronically signed by Lyman Bishop MD Signature Date/Time: 06/23/2019/2:23:56 PM    Final        Discharge Exam: Vitals:   06/23/19 1535 06/23/19 1540  BP: (!) 148/92 (!) 151/94  Pulse: 62 71  Resp: (!) 8 16  Temp:    SpO2: 100% 99%    GENERAL: No acute distress.  Appears well.  HEENT: MMM.  Vision and hearing grossly intact.  NECK: Supple.  No apparent JVD.  RESP:  No IWOB. Good air movement bilaterally. CVS:  RRR. Heart sounds normal.  ABD/GI/GU: Bowel sounds present. Soft. Non tender.  MSK/EXT:  Moves extremities. No apparent deformity or  edema.  SKIN: no apparent skin lesion or wound NEURO: Awake, alert and oriented appropriately.  No apparent focal neuro deficit. PSYCH: Calm. Normal affect.   The results of significant diagnostics from this hospitalization (including imaging, microbiology, ancillary and laboratory) are listed below for reference.     Microbiology: Recent Results (from the past 240 hour(s))  SARS CORONAVIRUS 2 (TAT 6-24 HRS) Nasopharyngeal Nasopharyngeal Swab     Status: None   Collection Time: 06/22/19  6:10 AM   Specimen: Nasopharyngeal Swab  Result Value Ref Range Status   SARS Coronavirus 2 NEGATIVE NEGATIVE Final    Comment: (NOTE) SARS-CoV-2 target nucleic acids are NOT DETECTED. The SARS-CoV-2 RNA is generally detectable in upper and lower respiratory specimens during the acute phase of infection. Negative results do not preclude SARS-CoV-2 infection, do not rule out co-infections with other pathogens, and should not be used as the sole basis for treatment or other patient management decisions. Negative results must be combined with clinical observations, patient history, and epidemiological information. The expected result is Negative. Fact Sheet for Patients: SugarRoll.be Fact Sheet for Healthcare Providers: https://www.woods-mathews.com/ This test is not yet approved or cleared by  the [employer] and  has been authorized for detection and/or diagnosis of SARS-CoV-2 by FDA under an Emergency Use Authorization (EUA). This EUA will remain  in effect (meaning this test can be used) for the duration of the COVID-19 declaration under Section 56 4(b)(1) of the Act, 21 U.S.C. section 360bbb-3(b)(1), unless the authorization is terminated or revoked sooner. Performed at [facility], [place] [address] [place], [place] [address]      Labs: BNP (last 3 results) No results for input(s): BNP in the last 8760 hours. Basic Metabolic  Panel: Recent Labs  Lab 06/22/19 0456 06/22/19 1439 06/23/19 0440  NA 136  --  138  K 4.0  --  4.0  CL 105  --  108  CO2 25  --  21*  GLUCOSE 102*  --  112*  BUN 23*  --  17  CREATININE 1.21  --  1.09  CALCIUM 8.6*  --  8.5*  MG  --  2.0  --    Liver Function Tests: No results for input(s): AST, ALT, ALKPHOS, BILITOT, PROT, ALBUMIN in the last 168 hours. No results for input(s): LIPASE, AMYLASE in the last 168 hours. No results for input(s): AMMONIA in the last 168 hours. CBC: Recent Labs  Lab 06/22/19 0456 06/23/19 0440  WBC 5.3 6.4  HGB 15.7 15.5  HCT 47.3 46.8  MCV 98.1 97.3  PLT 191 184   Cardiac Enzymes: No results for input(s): CKTOTAL, CKMB, CKMBINDEX, TROPONINI in the last 168 hours. BNP: Invalid input(s): POCBNP CBG: No results for input(s): GLUCAP in the last 168 hours. D-Dimer No results for input(s): DDIMER in the last 72 hours. Hgb A1c Recent Labs    06/22/19 1439  HGBA1C 5.7*   Lipid Profile Recent Labs    06/22/19 1439  CHOL 208*  HDL 35*  LDLCALC 129*  TRIG 218*  CHOLHDL 5.9   Thyroid function studies Recent Labs    06/22/19 1439  TSH 1.369   Anemia work up No results for input(s): VITAMINB12, FOLATE, FERRITIN, TIBC, IRON, RETICCTPCT in the last 72 hours. Urinalysis    Component Value Date/Time   COLORURINE YELLOW 09/06/2014 0242   APPEARANCEUR CLEAR 09/06/2014 0242   LABSPEC 1.030 09/06/2014 0242   PHURINE 5.5 09/06/2014 0242   GLUCOSEU NEGATIVE 09/06/2014 0242   HGBUR LARGE (A) 09/06/2014 0242   BILIRUBINUR NEGATIVE 09/06/2014 0242   KETONESUR 15 (A) 09/06/2014 0242   PROTEINUR NEGATIVE 09/06/2014 0242   UROBILINOGEN 0.2 09/06/2014 0242   NITRITE NEGATIVE 09/06/2014 0242   LEUKOCYTESUR NEGATIVE 09/06/2014 0242   Sepsis Labs Invalid input(s): PROCALCITONIN,  WBC,  LACTICIDVEN   Time coordinating discharge: 35 minutes  SIGNED:  Mercy Riding, MD  [facility] 06/23/2019, 3:50 PM  If 7PM-7AM, please contact  night-coverage [email or web address] Password [email or web address]

## 2019-06-23 NOTE — Progress Notes (Addendum)
Progress Note  Patient Name: Jerry Keller Date of Encounter: 06/23/2019  Primary Cardiologist: Quay Burow, MD   Subjective   Plan for cath today. Patient has not had further chest pain. The Nitro paste caused headache last night.    Inpatient Medications    Scheduled Meds: . ALPRAZolam  0.5 mg Oral BID  . [START ON 06/24/2019] aspirin EC  81 mg Oral Daily  . atorvastatin  80 mg Oral q1800  . citalopram  20 mg Oral Daily  . folic acid  [id number] mcg Oral Daily  . levothyroxine  100 mcg Oral QAC breakfast  . metoprolol tartrate  50 mg Oral q AM  . nitroGLYCERIN  1 inch Topical Q6H  . pantoprazole  40 mg Oral Daily  . sodium chloride flush  3 mL Intravenous Q12H  . tamsulosin  0.4 mg Oral QPC breakfast   Continuous Infusions: . sodium chloride Stopped (06/22/19 1253)  . sodium chloride    . sodium chloride 1 mL/kg/hr (06/23/19 1029)  . heparin 1,400 Units/hr (06/23/19 0106)   PRN Meds: sodium chloride, sodium chloride, acetaminophen **OR** acetaminophen, fentaNYL (SUBLIMAZE) injection, morphine injection, nitroGLYCERIN, ondansetron **OR** ondansetron (ZOFRAN) IV, sodium chloride flush   Vital Signs    Vitals:   06/22/19 2020 06/23/19 0444 06/23/19 0845 06/23/19 1004  BP: 124/72 114/81 136/82 124/86  Pulse: (!) 58 63 67 (!) 59  Resp: 20 16  20   Temp: 98.1 F (36.7 C) 97.8 F (36.6 C)  97.8 F (36.6 C)  TempSrc: Oral Oral  Oral  SpO2: 95% 96%  98%  Weight:  121.4 kg    Height:        Intake/Output Summary (Last 24 hours) at 06/23/2019 1047 Last data filed at 06/23/2019 0600 Gross per 24 hour  Intake 720.99 ml  Output --  Net 720.99 ml   Last 3 Weights 06/23/2019 06/22/2019 06/22/2019  Weight (lbs) 267 lb 9.6 oz 268 lb 4.8 oz 268 lb  Weight (kg) 121.383 kg 121.7 kg 121.564 kg      Telemetry    NSR, HR 50-60s - Personally Reviewed  ECG    Pending - Personally Reviewed  Physical Exam   GEN: No acute distress.   Neck: No JVD Cardiac: RRR, no murmurs,  rubs, or gallops.  Respiratory: Clear to auscultation bilaterally. GI: Soft, nontender, non-distended  MS: No edema; No deformity. Neuro:  Nonfocal  Psych: Normal affect   Labs    High Sensitivity Troponin:   Recent Labs  Lab 06/22/19 0458 06/22/19 0610 06/22/19 1439  TROPONINIHS 6 5 7       Chemistry Recent Labs  Lab 06/22/19 0456 06/23/19 0440  NA 136 138  K 4.0 4.0  CL 105 108  CO2 25 21*  GLUCOSE 102* 112*  BUN 23* 17  CREATININE 1.21 1.09  CALCIUM 8.6* 8.5*  GFRNONAA >60 >60  GFRAA >60 >60  ANIONGAP 6 9     Hematology Recent Labs  Lab 06/22/19 0456 06/23/19 0440  WBC 5.3 6.4  RBC 4.82 4.81  HGB 15.7 15.5  HCT 47.3 46.8  MCV 98.1 97.3  MCH 32.6 32.2  MCHC 33.2 33.1  RDW 12.1 12.1  PLT 191 184    BNPNo results for input(s): BNP, PROBNP in the last 168 hours.   DDimer No results for input(s): DDIMER in the last 168 hours.   Radiology    DG Chest 2 View  Result Date: 06/22/2019 CLINICAL DATA:  Chest pain EXAM: CHEST - 2 VIEW COMPARISON:  03/08/2014 FINDINGS: Normal heart size and mediastinal contours. No acute infiltrate or edema. No effusion or pneumothorax. No acute osseous findings. IMPRESSION: Stable chest.  No visible active disease. Electronically Signed   By: Monte Fantasia M.D.   On: 06/22/2019 04:22    Cardiac Studies   Cath: 02/2014  Angiographic Findings:  Left main: No obstructive disease.   Left Anterior Descending Artery: Large caliber vessel that courses to the apex. There is a 20% mid stenosis and 20% distal stenosis. The first diagonal branch is moderate in caliber with no obstructive disease.   Circumflex Artery:Large caliber vessel with termination into a large obtuse marginal branch. No obstructive disease.   Right Coronary Artery: Large dominant vessel with proximal 20% stenosis, serial 20% stenoses in the mid vessel.   Left Ventricular Angiogram: LVEF=55-60%  Impression: 1. Mild non-obstructive CAD 2. Normal  LV systolic function 3. Non-cardiac chest pain  Recommendations: He will need treatment for his CAD with a statin. He may need formal GI evaluation for ongoing chest pain. I will change his Protonix to 40 mg po BID. GI cocktail tonight. Would consult GI in the am.   Patient Profile     59 y.o. 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   Assessment & Plan    Chest Pain Patient presented with intermittent episodes of chest pain. HS trop negative x 3. EKG nonacute. With progressive symptoms will plan for cath today.  - IV heparin  - Aspirin started - Atorvastatin 80 mg started - Lopressor 50mg  daily started - Patient has not had recurrent chest pain. Further recs per cath Risks and benefits of cardiac catheterization have been discussed with the patient.  These include bleeding, infection, kidney damage, stroke, heart attack, death.  The patient understands these risks and is willing to proceed.  HLD - LDL 129 - Atorvastatin 80 mg added  For questions or updates, please contact [place] Please consult [email or web address] for contact info under     Signed, Cadence Ninfa Meeker, PA-C  06/23/2019, 10:47 AM    The patient was seen, examined and discussed with Cadence Ninfa Meeker, PA-C   and I agree with the above.   The patient had no recurrent chest pains overnight while being at rest, he is awaiting his cardiac cath today.  Ena Dawley, MD 06/23/2019

## 2019-06-23 NOTE — Progress Notes (Addendum)
ANTICOAGULATION CONSULT NOTE   Pharmacy Consult for heparin Indication: chest pain/ACS  Assessment: 59 yr old male started on heparin for left-sided CP.  He was not on anticoagulation PTA. Medical hx includes: HLD, OSA (CPAP), obesity, GERD, hypothyroidism, CAD (cath in 2015 showed mild CAD).   Heparin level 0.19 units/ml.  No issues with infusion    Goal of Therapy:  Heparin level 0.3-0.7 units/ml Monitor platelets by anticoagulation protocol: Yes   Plan:  Rebolus 1000 units heparin and increase drip to 1400 units/hr Check heparin level in 6 hrs Monitor daily heparin level, CBC Monitor for signs/symptoms of bleeding Thanks for allowing pharmacy to be a part of this patient's care.  Excell Seltzer, PharmD Clinical Pharmacist  06/23/2019,1:03 AM   9:10 AM: Heparin level now therapeutic Follow up after cath  Thank you Anette Guarneri, PharmD

## 2019-06-23 NOTE — Progress Notes (Signed)
PROGRESS NOTE  Jerry Keller [id number] DOB: [birth year]   PCP: Christain Sacramento, MD  Patient is from: Home.   DOA: 06/22/2019 LOS: 0  Brief Narrative / Interim history: 59 year old male with history of OSA on CPAP, mild CAD on cath in 2015, obesity, HLD, GERD, depression and anxiety presented to [facility] ED with chest pain, shortness of breath and edema and admitted for ACS rule out.  In ED, HDS.  Normal saturation on room air.  CBC, BMP, chest x-ray, troponin all came back within normal limits.  EKG: No acute changes.  Patient started on heparin and transferred to [facility] for further evaluation and management of his chest pain.  Patient was evaluated by cardiology.  Plan is for left heart catheterization on 07/03/2019.  Subjective: No major events overnight or this morning.  Reports headache after nitro paste.  No other complaints.  He denies chest pain, dyspnea, vision change, GI symptoms or focal numbness, tingling or weakness.  Objective: Vitals:   06/22/19 2020 06/23/19 0444 06/23/19 0845 06/23/19 1004  BP: 124/72 114/81 136/82 124/86  Pulse: (!) 58 63 67 (!) 59  Resp: 20 16  20   Temp: 98.1 F (36.7 C) 97.8 F (36.6 C)  97.8 F (36.6 C)  TempSrc: Oral Oral  Oral  SpO2: 95% 96%  98%  Weight:  121.4 kg    Height:        Intake/Output Summary (Last 24 hours) at 06/23/2019 1204 Last data filed at 06/23/2019 0600 Gross per 24 hour  Intake 720.99 ml  Output -  Net 720.99 ml   Filed Weights   06/22/19 0352 06/22/19 1414 06/23/19 0444  Weight: 121.6 kg 121.7 kg 121.4 kg    Examination:  GENERAL: No acute distress.  Appears well.  Lying with CPAP. HEENT: MMM.  Vision and hearing grossly intact.  NECK: Supple.  No apparent JVD.  RESP:  No IWOB. Good air movement bilaterally. CVS:  RRR. Heart sounds normal.  ABD/GI/GU: Bowel sounds present. Soft. Non tender.  MSK/EXT:  Moves extremities. No apparent deformity. No edema.  SKIN: no apparent skin lesion or wound  NEURO: Awake, alert and oriented appropriately.  No apparent focal neuro deficit. PSYCH: Calm. Normal affect.  Procedures:  3/10-plan for [facility]  Assessment & Plan: Chest pain with typical and atypical features-now chest pain-free but significant risk factors. HS-troponin negative x3.  EKG without acute finding.  LDL 129.  A1c 5.7%. -Continue IV heparin, aspirin, atorvastatin and Lopressor -Plan for left heart catheterization today. -Follow echocardiogram.  Hypothyroidism: TSH normal. -Continue levothyroxine  Hyperlipidemia: LDL 129.  HDL 35. -High intensity statin  Obstructive sleep apnea on CPAP -Continue nightly CPAP-compliant.  Depression/anxiety: Stable. -Continue Celexa and Xanax as needed  Class II obesity:Body mass index is 36.29 kg/m. -Encourage lifestyle change to lose weight. -May benefit from GLP-1 inhibitors  BPH without LUTS -Continue Flomax  Tension headache-due to nitro.  No red flags. -As needed Tylenol after LHC   GERD:  -Continue PPI             DVT prophylaxis: On heparin drip for chest pain Code Status: Full code Family Communication: Patient and/or RN. Available if any question.   Discharge barrier: Evaluation for chest pain.  Patient is on heparin drip and waiting on LHC Patient is from: Home Final disposition: Likely home once medically stable and cleared by cardiology  Consultants: Cardiology   Microbiology summarized: [id number] negative  Sch Meds:  Scheduled Meds: . ALPRAZolam  0.5 mg Oral  BID  . [START ON 06/24/2019] aspirin EC  81 mg Oral Daily  . atorvastatin  80 mg Oral q1800  . citalopram  20 mg Oral Daily  . folic acid  [id number] mcg Oral Daily  . levothyroxine  100 mcg Oral QAC breakfast  . metoprolol tartrate  50 mg Oral q AM  . nitroGLYCERIN  1 inch Topical Q6H  . pantoprazole  40 mg Oral Daily  . sodium chloride flush  3 mL Intravenous Q12H  . tamsulosin  0.4 mg Oral QPC breakfast   Continuous Infusions: .  sodium chloride Stopped (06/22/19 1253)  . sodium chloride    . sodium chloride 1 mL/kg/hr (06/23/19 1029)  . heparin 1,400 Units/hr (06/23/19 0106)   PRN Meds:.sodium chloride, sodium chloride, acetaminophen **OR** acetaminophen, fentaNYL (SUBLIMAZE) injection, morphine injection, nitroGLYCERIN, ondansetron **OR** ondansetron (ZOFRAN) IV, sodium chloride flush  Antimicrobials: Anti-infectives (From admission, onward)   None       I have personally reviewed the following labs and images: CBC: Recent Labs  Lab 06/22/19 0456 06/23/19 0440  WBC 5.3 6.4  HGB 15.7 15.5  HCT 47.3 46.8  MCV 98.1 97.3  PLT 191 184   BMP &GFR Recent Labs  Lab 06/22/19 0456 06/22/19 1439 06/23/19 0440  NA 136  --  138  K 4.0  --  4.0  CL 105  --  108  CO2 25  --  21*  GLUCOSE 102*  --  112*  BUN 23*  --  17  CREATININE 1.21  --  1.09  CALCIUM 8.6*  --  8.5*  MG  --  2.0  --    Estimated Creatinine Clearance: 101.8 mL/min (by C-G formula based on SCr of 1.09 mg/dL). Liver & Pancreas: No results for input(s): AST, ALT, ALKPHOS, BILITOT, PROT, ALBUMIN in the last 168 hours. No results for input(s): LIPASE, AMYLASE in the last 168 hours. No results for input(s): AMMONIA in the last 168 hours. Diabetic: Recent Labs    06/22/19 1439  HGBA1C 5.7*   No results for input(s): GLUCAP in the last 168 hours. Cardiac Enzymes: No results for input(s): CKTOTAL, CKMB, CKMBINDEX, TROPONINI in the last 168 hours. No results for input(s): PROBNP in the last 8760 hours. Coagulation Profile: No results for input(s): INR, PROTIME in the last 168 hours. Thyroid Function Tests: Recent Labs    06/22/19 1439  TSH 1.369   Lipid Profile: Recent Labs    06/22/19 1439  CHOL 208*  HDL 35*  LDLCALC 129*  TRIG 218*  CHOLHDL 5.9   Anemia Panel: No results for input(s): VITAMINB12, FOLATE, FERRITIN, TIBC, IRON, RETICCTPCT in the last 72 hours. Urine analysis:    Component Value Date/Time    COLORURINE YELLOW 09/06/2014 0242   APPEARANCEUR CLEAR 09/06/2014 0242   LABSPEC 1.030 09/06/2014 0242   PHURINE 5.5 09/06/2014 0242   GLUCOSEU NEGATIVE 09/06/2014 0242   HGBUR LARGE (A) 09/06/2014 0242   BILIRUBINUR NEGATIVE 09/06/2014 0242   KETONESUR 15 (A) 09/06/2014 0242   PROTEINUR NEGATIVE 09/06/2014 0242   UROBILINOGEN 0.2 09/06/2014 0242   NITRITE NEGATIVE 09/06/2014 0242   LEUKOCYTESUR NEGATIVE 09/06/2014 0242   Sepsis Labs: Invalid input(s): PROCALCITONIN, [place]  Microbiology: Recent Results (from the past 240 hour(s))  SARS CORONAVIRUS 2 (TAT 6-24 HRS) Nasopharyngeal Nasopharyngeal Swab     Status: None   Collection Time: 06/22/19  6:10 AM   Specimen: Nasopharyngeal Swab  Result Value Ref Range Status   SARS Coronavirus 2 NEGATIVE NEGATIVE Final  Comment: (NOTE) SARS-CoV-2 target nucleic acids are NOT DETECTED. The SARS-CoV-2 RNA is generally detectable in upper and lower respiratory specimens during the acute phase of infection. Negative results do not preclude SARS-CoV-2 infection, do not rule out co-infections with other pathogens, and should not be used as the sole basis for treatment or other patient management decisions. Negative results must be combined with clinical observations, patient history, and epidemiological information. The expected result is Negative. Fact Sheet for Patients: SugarRoll.be Fact Sheet for Healthcare Providers: https://www.woods-mathews.com/ This test is not yet approved or cleared by the [place] FDA and  has been authorized for detection and/or diagnosis of SARS-CoV-2 by FDA under an Emergency Use Authorization (EUA). This EUA will remain  in effect (meaning this test can be used) for the duration of the COVID-19 declaration under Section 56 4(b)(1) of the Act, 21 U.S.C. section 360bbb-3(b)(1), unless the authorization is terminated or revoked sooner. Performed at [facility], [place] [address] [place], [place] [address]     Radiology Studies: No results found.    Duchess Armendarez T. Grayland  If 7PM-7AM, please contact night-coverage [email or web address] Password [facility] 06/23/2019, 12:04 PM

## 2020-06-23 ENCOUNTER — Other Ambulatory Visit: Payer: Self-pay | Admitting: Family Medicine

## 2020-06-23 DIAGNOSIS — G44229 Chronic tension-type headache, not intractable: Secondary | ICD-10-CM | POA: Insufficient documentation

## 2020-06-23 DIAGNOSIS — G8929 Other chronic pain: Secondary | ICD-10-CM

## 2020-07-12 ENCOUNTER — Other Ambulatory Visit: Payer: Self-pay

## 2020-07-12 ENCOUNTER — Ambulatory Visit
Admission: RE | Admit: 2020-07-12 | Discharge: 2020-07-12 | Disposition: A | Discharge status: Home / Self Care | Payer: BC Managed Care – PPO | Source: Ambulatory Visit | Attending: Family Medicine | Admitting: Family Medicine

## 2020-07-12 DIAGNOSIS — M5442 Lumbago with sciatica, left side: Secondary | ICD-10-CM

## 2020-07-12 DIAGNOSIS — G8929 Other chronic pain: Secondary | ICD-10-CM

## 2020-10-05 ENCOUNTER — Other Ambulatory Visit: Payer: Self-pay | Admitting: Neurosurgery

## 2020-10-09 ENCOUNTER — Other Ambulatory Visit: Payer: Self-pay | Admitting: Neurosurgery

## 2020-10-18 ENCOUNTER — Encounter (HOSPITAL_COMMUNITY): Payer: Self-pay | Admitting: Neurosurgery

## 2020-10-18 NOTE — Anesthesia Preprocedure Evaluation (Addendum)
Anesthesia Evaluation  Patient identified by MRN, date of birth, ID band Patient awake    Reviewed: Allergy & Precautions, NPO status , Patient's Chart, lab work & pertinent test results  Airway Mallampati: III  TM Distance: >3 FB Neck ROM: Full    Dental no notable dental hx. (+) Teeth Intact, Dental Advisory Given   Pulmonary sleep apnea and Continuous Positive Airway Pressure Ventilation , former smoker,    Pulmonary exam normal breath sounds clear to auscultation       Cardiovascular + angina + CAD  Normal cardiovascular exam Rhythm:Regular Rate:Normal  EKG: Last EKG noted is from 06/23/19: SB at 56 bpm, incomplete RBBB.    CV: Echo 06/23/19: IMPRESSIONS  1. Left ventricular ejection fraction, by estimation, is 60 to 65%. The  left ventricle has normal function. The left ventricle has no regional  wall motion abnormalities. There is mild left ventricular hypertrophy.  Left ventricular diastolic parameters  were normal.  2. Right ventricular systolic function is normal. The right ventricular  size is normal.  3. The mitral valve is grossly normal. No evidence of mitral valve  regurgitation.  4. The aortic valve is tricuspid. Aortic valve regurgitation is not  visualized.  5. The inferior vena cava is normal in size with greater than 50%  respiratory variability, suggesting right atrial pressure of 3 mmHg.   Cardiac cath 06/23/19: Prox RCA lesion is 35% stenosed. Mid RCA to Dist RCA lesion is 25% stenosed. Prox LAD to Mid LAD lesion is 35% stenosed. 1st Diag lesion is 30% stenosed.  1. CAD, nonobstructive 2. Normal LV function by echo 3. Elevated LVEDP Recommend: medical therapy, lifestyle modification,   Neuro/Psych  Headaches, PSYCHIATRIC DISORDERS Anxiety    GI/Hepatic Neg liver ROS, GERD  Medicated,  Endo/Other  Hypothyroidism   Renal/GU negative Renal ROS  negative genitourinary    Musculoskeletal negative musculoskeletal ROS (+)   Abdominal   Peds  Hematology negative hematology ROS (+)   Anesthesia Other Findings   Reproductive/Obstetrics                           Anesthesia Physical Anesthesia Plan  ASA: 3  Anesthesia Plan: General   Post-op Pain Management:    Induction: Intravenous  PONV Risk Score and Plan: 2 and Midazolam, Dexamethasone and Ondansetron  Airway Management Planned: Oral ETT  Additional Equipment:   Intra-op Plan:   Post-operative Plan: Extubation in OR  Informed Consent: I have reviewed the patients History and Physical, chart, labs and discussed the procedure including the risks, benefits and alternatives for the proposed anesthesia with the patient or authorized representative who has indicated his/her understanding and acceptance.     Dental advisory given  Plan Discussed with: CRNA  Anesthesia Plan Comments: ( )      Anesthesia Quick Evaluation

## 2020-10-18 NOTE — Progress Notes (Signed)
DUE TO COVID-19 ONLY ONE VISITOR IS ALLOWED TO COME WITH YOU AND STAY IN THE WAITING ROOM ONLY DURING PRE OP AND PROCEDURE DAY OF SURGERY. Two  VISITORS  MAY VISIT WITH YOU AFTER SURGERY IN YOUR PRIVATE ROOM DURING VISITING HOURS ONLY! . PCP - Bing Matter, PA-C Cardiologist - n/a  Chest x-ray - n/a EKG - DOS 10/19/20 Stress Test -  ECHO - 06/23/19 Cardiac Cath - 06/23/19  Sleep Study -  Yes CPAP - uses cpap  Aspirin Instructions: Last dose on 10/16/20.  Anesthesia review: Yes  STOP now taking any Aspirin (unless otherwise instructed by your surgeon), Aleve, Naproxen, Ibuprofen, Motrin, Advil, Goody's, BC's, all herbal medications, fish oil, and all vitamins.   Coronavirus Screening Covid test is scheduled on DOS 10/19/20 Do you have any of the following symptoms:  Cough yes/no: No Fever (>100.68F)  yes/no: No Runny nose yes/no: No Sore throat yes/no: No Difficulty breathing/shortness of breath  yes/no: No  Have you traveled in the last 14 days and where? yes/no: No  Patient verbalized understanding of instructions that were given via phone.

## 2020-10-18 NOTE — Progress Notes (Signed)
Anesthesia Chart Review:  Case: [id number] Date/Time: 10/19/20 0715   Procedure: PLIF,IP,POSTERIOR INSTRUMENTATION, L5-S1 - 3C   Anesthesia type: General   Pre-op diagnosis: SPONDYLOLISTHESIS, LUMBOSACRAL REGION   Location: [facility] OR ROOM 20 / [facility]   Surgeons: Newman Pies, MD       DISCUSSION: Patient is a 60 year old male scheduled for the above procedure.  History includes former smoker (quit 04/15/00), HLD, hypothyroidism, palpitations, CAD (mild, non-obstructive CAD 06/2019 LHC), OSA (severe OSA 2014, CPAP prescribed), GERD, anxiety.  He is a same day work-up, and PAT RN still attempting to reach patient to complete preoperative phone call. He had mild CAD (25-35% blockages) on LHC from last year. Last EKG is > 1 year ago. Last labs are > 30 days ago. Anesthesia team to evaluate on the day of surgery.    VS:  Blood Pressure 124/80 06/22/2020 3:45 PM EST  Pulse 76 06/22/2020 3:45 PM EST  Temperature 37 C (98.6 F) 06/22/2020 3:45 PM EST  Respiratory Rate 18 06/22/2020 3:45 PM EST  Oxygen Saturation 96% 06/22/2020 3:45 PM EST  Weight 116.9 kg (257 lb 12.8 oz) 06/22/2020 3:45 PM EST  Height 185.4 cm (6\' 1" ) 06/22/2020 3:45 PM EST  Body Mass Index 34.01 06/22/2020 3:45 PM EST     PROVIDERS: Christain Sacramento, MD is PCP Quay Burow, MD is listed as cardiologist from 2015. More recently saw Ena Dawley, MD and [name], MD during 06/2019 admission for chest pain. He had mild non-obstructive CAD by cath then. Echo showed normal LVEF with no significant valvular disease.   LABS: For day of surgery. Last labs noted are from 06/21/20 ([facility]) and show: Sodium 139, potassium 4.3, BUN 18, glucose 105, creatinine 1.00, total bilirubin 0.5, AST 20, ALT 27, A1c 5.8%, WBC 8.4, hemoglobin 14.1, hematocrit 41.4, platelet count 195K, TSH 2.746, free T4 0.7.  Home Sleep Test 07/2712: AHI of 80.7/hour of sleep.  Lowest SaO2 was 73% with an average of 89%.   Impressions: Severe  obstructive sleep apnea with hypopneas causing severe oxygen desaturation. Recommendations: Treatment options for this degree of sleep disordered breathing include weight loss and CPAP therapy.  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 Mild left neural foraminal narrowing at this level. - Central L4-5 protrusion/annular fissuring abutting the L5 nerve roots with mild bilateral neural foraminal narrowing. - Mild right L3-4 neural foraminal narrowing. - Likely benign 1-2 mm intrathecal lesion at the L2 level, slightly increased in size since 2012.    EKG: Last EKG noted is from 06/23/19: SB at 56 bpm, incomplete RBBB.    CV: Echo 06/23/19: IMPRESSIONS   1. Left ventricular ejection fraction, by estimation, is 60 to 65%. The  left ventricle has normal function. The left ventricle has no regional  wall motion abnormalities. There is mild left ventricular hypertrophy.  Left ventricular diastolic parameters  were normal.   2. Right ventricular systolic function is normal. The right ventricular  size is normal.   3. The mitral valve is grossly normal. No evidence of mitral valve  regurgitation.   4. The aortic valve is tricuspid. Aortic valve regurgitation is not  visualized.   5. The inferior vena cava is normal in size with greater than 50%  respiratory variability, suggesting right atrial pressure of 3 mmHg.    Cardiac cath 06/23/19: Prox RCA lesion is 35% stenosed. Mid RCA to Dist RCA lesion is 25% stenosed. Prox LAD to Mid LAD lesion is 35%  stenosed. 1st Diag lesion is 30% stenosed.   1. CAD, nonobstructive 2. Normal LV function by echo 3. Elevated LVEDP Recommend: medical therapy, lifestyle modification,  Past Medical History:  Diagnosis Date   Anxiety    GERD (gastroesophageal reflux disease)    Headache, cluster    Hyperlipidemia    a. Mild - statin started 02/2014.   Hypothyroidism    Non-obstructive  CAD    a. 02/2014 Cath: LM nl, LAD 13m/d, LCX nl, RCA 20p/m, EF 55-60%-->Med Rx.   Palpitations    Sleep apnea     Past Surgical History:  Procedure Laterality Date   COLONOSCOPY N/A 07/30/2013   Procedure: COLONOSCOPY;  Surgeon: Beryle Beams, MD;  Location: [facility] ENDOSCOPY;  Service: Endoscopy;  Laterality: N/A;   LEFT HEART CATH AND CORONARY ANGIOGRAPHY N/A 06/23/2019   Procedure: LEFT HEART CATH AND CORONARY ANGIOGRAPHY;  Surgeon: [name], MD;  Location: [facility] CV LAB;  Service: Cardiovascular;  Laterality: N/A;   LEFT HEART CATHETERIZATION WITH CORONARY ANGIOGRAM N/A 03/08/2014   Procedure: LEFT HEART CATHETERIZATION WITH CORONARY ANGIOGRAM;  Surgeon: Burnell Blanks, MD;  Location: [facility] CATH LAB;  Service: Cardiovascular;  Laterality: N/A;   TONSILLECTOMY AND ADENOIDECTOMY     as a child    MEDICATIONS: No current facility-administered medications for this encounter.    ALPRAZolam (XANAX) 0.5 MG tablet   aspirin EC 81 MG tablet   Cholecalciferol (VITAMIN D3) 50 MCG (2000 UT) TABS   clindamycin (CLEOCIN) 712 MG capsule   folic acid (FOLVITE) 458 MCG tablet   gabapentin (NEURONTIN) 300 MG capsule   HYDROcodone-acetaminophen (NORCO/VICODIN) 5-325 MG per tablet   levothyroxine (SYNTHROID) 100 MCG tablet   Magnesium 250 MG TABS   metoprolol tartrate (LOPRESSOR) 50 MG tablet   PRESCRIPTION MEDICATION   tamsulosin (FLOMAX) 0.4 MG CAPS capsule   Ascorbic Acid (VITAMIN C GUMMIES) 125 MG CHEW   atorvastatin (LIPITOR) 80 MG tablet   citalopram (CELEXA) 20 MG tablet   esomeprazole (NEXIUM 24HR) 20 MG capsule   fluticasone (FLONASE) 50 MCG/ACT nasal spray   pantoprazole (PROTONIX) 40 MG tablet    Myra Gianotti, PA-C Surgical Short Stay/Anesthesiology [facility] Phone [phone] [facility] Phone [phone] 10/18/2020 4:27 PM

## 2020-10-19 ENCOUNTER — Inpatient Hospital Stay (HOSPITAL_COMMUNITY): Payer: BC Managed Care – PPO | Admitting: Certified Registered"

## 2020-10-19 ENCOUNTER — Inpatient Hospital Stay (HOSPITAL_COMMUNITY): Payer: BC Managed Care – PPO

## 2020-10-19 ENCOUNTER — Encounter (HOSPITAL_COMMUNITY): Payer: Self-pay | Admitting: Neurosurgery

## 2020-10-19 ENCOUNTER — Inpatient Hospital Stay (HOSPITAL_COMMUNITY)
Admission: RE | Admit: 2020-10-19 | Discharge: 2020-10-20 | DRG: 455 | Disposition: A | Discharge status: Home / Self Care | Payer: BC Managed Care – PPO | Attending: Neurosurgery | Admitting: Neurosurgery

## 2020-10-19 ENCOUNTER — Inpatient Hospital Stay (HOSPITAL_COMMUNITY)
Admission: RE | Disposition: A | Discharge status: Home / Self Care | Payer: Self-pay | Source: Home / Self Care | Attending: Neurosurgery

## 2020-10-19 ENCOUNTER — Other Ambulatory Visit: Payer: Self-pay

## 2020-10-19 DIAGNOSIS — M5417 Radiculopathy, lumbosacral region: Secondary | ICD-10-CM | POA: Diagnosis present

## 2020-10-19 DIAGNOSIS — Z91040 Latex allergy status: Secondary | ICD-10-CM | POA: Diagnosis not present

## 2020-10-19 DIAGNOSIS — F419 Anxiety disorder, unspecified: Secondary | ICD-10-CM | POA: Diagnosis present

## 2020-10-19 DIAGNOSIS — M4317 Spondylolisthesis, lumbosacral region: Principal | ICD-10-CM | POA: Diagnosis present

## 2020-10-19 DIAGNOSIS — Z83438 Family history of other disorder of lipoprotein metabolism and other lipidemia: Secondary | ICD-10-CM

## 2020-10-19 DIAGNOSIS — K219 Gastro-esophageal reflux disease without esophagitis: Secondary | ICD-10-CM | POA: Diagnosis present

## 2020-10-19 DIAGNOSIS — Z79899 Other long term (current) drug therapy: Secondary | ICD-10-CM | POA: Diagnosis not present

## 2020-10-19 DIAGNOSIS — Z7982 Long term (current) use of aspirin: Secondary | ICD-10-CM

## 2020-10-19 DIAGNOSIS — E039 Hypothyroidism, unspecified: Secondary | ICD-10-CM | POA: Diagnosis present

## 2020-10-19 DIAGNOSIS — E785 Hyperlipidemia, unspecified: Secondary | ICD-10-CM | POA: Diagnosis present

## 2020-10-19 DIAGNOSIS — M5117 Intervertebral disc disorders with radiculopathy, lumbosacral region: Secondary | ICD-10-CM | POA: Diagnosis present

## 2020-10-19 DIAGNOSIS — Z20822 Contact with and (suspected) exposure to covid-19: Secondary | ICD-10-CM | POA: Diagnosis present

## 2020-10-19 DIAGNOSIS — M4807 Spinal stenosis, lumbosacral region: Secondary | ICD-10-CM | POA: Diagnosis present

## 2020-10-19 DIAGNOSIS — Z79890 Hormone replacement therapy (postmenopausal): Secondary | ICD-10-CM | POA: Diagnosis not present

## 2020-10-19 DIAGNOSIS — Z8249 Family history of ischemic heart disease and other diseases of the circulatory system: Secondary | ICD-10-CM

## 2020-10-19 DIAGNOSIS — I2510 Atherosclerotic heart disease of native coronary artery without angina pectoris: Secondary | ICD-10-CM | POA: Diagnosis present

## 2020-10-19 DIAGNOSIS — G4733 Obstructive sleep apnea (adult) (pediatric): Secondary | ICD-10-CM | POA: Diagnosis present

## 2020-10-19 DIAGNOSIS — E669 Obesity, unspecified: Secondary | ICD-10-CM | POA: Diagnosis present

## 2020-10-19 DIAGNOSIS — Z6836 Body mass index (BMI) 36.0-36.9, adult: Secondary | ICD-10-CM | POA: Diagnosis not present

## 2020-10-19 DIAGNOSIS — M48062 Spinal stenosis, lumbar region with neurogenic claudication: Secondary | ICD-10-CM | POA: Diagnosis present

## 2020-10-19 DIAGNOSIS — Z419 Encounter for procedure for purposes other than remedying health state, unspecified: Secondary | ICD-10-CM

## 2020-10-19 DIAGNOSIS — M545 Low back pain, unspecified: Secondary | ICD-10-CM | POA: Diagnosis present

## 2020-10-19 DIAGNOSIS — Z87891 Personal history of nicotine dependence: Secondary | ICD-10-CM | POA: Diagnosis not present

## 2020-10-19 HISTORY — DX: Anxiety disorder, unspecified: F41.9

## 2020-10-19 LAB — COMPREHENSIVE METABOLIC PANEL
ALT: 46 U/L — ABNORMAL HIGH (ref 0–44)
AST: 31 U/L (ref 15–41)
Albumin: 3.6 g/dL (ref 3.5–5.0)
Alkaline Phosphatase: 51 U/L (ref 38–126)
Anion gap: 8 (ref 5–15)
BUN: 16 mg/dL (ref 6–20)
CO2: 24 mmol/L (ref 22–32)
Calcium: 8.9 mg/dL (ref 8.9–10.3)
Chloride: 106 mmol/L (ref 98–111)
Creatinine, Ser: 0.97 mg/dL (ref 0.61–1.24)
GFR, Estimated: >60 mL/min (ref >60)
Glucose, Bld: 154 mg/dL — ABNORMAL HIGH (ref 70–99)
Potassium: 4.1 mmol/L (ref 3.5–5.1)
Sodium: 138 mmol/L (ref 135–145)
Total Bilirubin: 0.6 mg/dL (ref 0.3–1.2)
Total Protein: 6.1 g/dL — ABNORMAL LOW (ref 6.5–8.1)

## 2020-10-19 LAB — SARS CORONAVIRUS 2 BY RT PCR (HOSPITAL ORDER, PERFORMED IN ~~LOC~~ HOSPITAL LAB): SARS Coronavirus 2: NEGATIVE

## 2020-10-19 LAB — TYPE AND SCREEN
ABO/RH(D): A POS
Antibody Screen: NEGATIVE

## 2020-10-19 LAB — CBC
HCT: 42 % (ref 39.0–52.0)
Hemoglobin: 13.9 g/dL (ref 13.0–17.0)
MCH: 32.8 pg (ref 26.0–34.0)
MCHC: 33.1 g/dL (ref 30.0–36.0)
MCV: 99.1 fL (ref 80.0–100.0)
Platelets: 185 10*3/uL (ref 150–400)
RBC: 4.24 MIL/uL (ref 4.22–5.81)
RDW: 11.9 % (ref 11.5–15.5)
WBC: 4.9 10*3/uL (ref 4.0–10.5)
nRBC: 0 % (ref 0.0–0.2)

## 2020-10-19 LAB — SURGICAL PCR SCREEN
MRSA, PCR: NEGATIVE
Staphylococcus aureus: NEGATIVE

## 2020-10-19 LAB — ABO/RH: ABO/RH(D): A POS

## 2020-10-19 SURGERY — POSTERIOR LUMBAR FUSION 1 LEVEL
Anesthesia: General | Site: Back

## 2020-10-19 MED ORDER — SODIUM CHLORIDE 0.9% FLUSH
3.0000 mL | Freq: Two times a day (BID) | INTRAVENOUS | Status: DC
  Administered 2020-10-19: 3 mL via INTRAVENOUS

## 2020-10-19 MED ORDER — 0.9 % SODIUM CHLORIDE (POUR BTL) OPTIME
TOPICAL | Status: DC | PRN
  Administered 2020-10-19: 1000 mL

## 2020-10-19 MED ORDER — DEXAMETHASONE SODIUM PHOSPHATE 10 MG/ML IJ SOLN
INTRAMUSCULAR | Status: DC | PRN
  Administered 2020-10-19: 10 mg via INTRAVENOUS

## 2020-10-19 MED ORDER — FENTANYL CITRATE (PF) 250 MCG/5ML IJ SOLN
INTRAMUSCULAR | Status: AC
  Filled 2020-10-19: qty 5

## 2020-10-19 MED ORDER — KETAMINE HCL 50 MG/5ML IJ SOSY
PREFILLED_SYRINGE | INTRAMUSCULAR | Status: AC
  Filled 2020-10-19: qty 5

## 2020-10-19 MED ORDER — MIDAZOLAM HCL 5 MG/5ML IJ SOLN
INTRAMUSCULAR | Status: DC | PRN
  Administered 2020-10-19: 2 mg via INTRAVENOUS

## 2020-10-19 MED ORDER — HYDROCODONE-ACETAMINOPHEN 5-325 MG PO TABS: 1.0000 | ORAL_TABLET | ORAL | Status: DC | PRN

## 2020-10-19 MED ORDER — VITAMIN D 25 MCG (1000 UNIT) PO TABS: 2000.0000 [IU] | ORAL_TABLET | Freq: Every day | ORAL | Status: DC

## 2020-10-19 MED ORDER — THROMBIN 5000 UNITS EX SOLR: OROMUCOSAL | Status: DC | PRN

## 2020-10-19 MED ORDER — PHENYLEPHRINE HCL-NACL 10-0.9 MG/250ML-% IV SOLN
INTRAVENOUS | Status: DC | PRN
  Administered 2020-10-19: 50 ug/min via INTRAVENOUS

## 2020-10-19 MED ORDER — FLUTICASONE PROPIONATE 50 MCG/ACT NA SUSP
1.0000 | Freq: Every evening | NASAL | Status: DC | PRN
  Filled 2020-10-19: qty 16

## 2020-10-19 MED ORDER — CYCLOBENZAPRINE HCL 10 MG PO TABS
10.0000 mg | ORAL_TABLET | Freq: Three times a day (TID) | ORAL | Status: DC | PRN
  Administered 2020-10-19 – 2020-10-20 (×3): 10 mg via ORAL
  Filled 2020-10-19 (×3): qty 1

## 2020-10-19 MED ORDER — ACETAMINOPHEN 325 MG PO TABS: 650.0000 mg | ORAL_TABLET | ORAL | Status: DC | PRN

## 2020-10-19 MED ORDER — BUPIVACAINE LIPOSOME 1.3 % IJ SUSP
INTRAMUSCULAR | Status: DC | PRN
  Administered 2020-10-19: 20 mL

## 2020-10-19 MED ORDER — FENTANYL CITRATE (PF) 100 MCG/2ML IJ SOLN
INTRAMUSCULAR | Status: AC
  Filled 2020-10-19: qty 2

## 2020-10-19 MED ORDER — ALPRAZOLAM 0.5 MG PO TABS
0.5000 mg | ORAL_TABLET | Freq: Three times a day (TID) | ORAL | Status: DC | PRN
  Administered 2020-10-20: 0.5 mg via ORAL
  Filled 2020-10-19: qty 1

## 2020-10-19 MED ORDER — PHENYLEPHRINE HCL-NACL 10-0.9 MG/250ML-% IV SOLN
INTRAVENOUS | Status: AC
  Filled 2020-10-19: qty 250

## 2020-10-19 MED ORDER — KETOROLAC TROMETHAMINE 15 MG/ML IJ SOLN
INTRAMUSCULAR | Status: DC | PRN
  Administered 2020-10-19: 15 mg via INTRAVENOUS

## 2020-10-19 MED ORDER — FOLIC ACID 1 MG PO TABS: 0.5000 mg | ORAL_TABLET | Freq: Every day | ORAL | Status: DC

## 2020-10-19 MED ORDER — CHLORHEXIDINE GLUCONATE 0.12 % MT SOLN
15.0000 mL | Freq: Once | OROMUCOSAL | Status: AC
  Administered 2020-10-19: 15 mL via OROMUCOSAL
  Filled 2020-10-19: qty 15

## 2020-10-19 MED ORDER — TAMSULOSIN HCL 0.4 MG PO CAPS: 0.4000 mg | ORAL_CAPSULE | Freq: Every day | ORAL | Status: DC

## 2020-10-19 MED ORDER — ONDANSETRON HCL 4 MG/2ML IJ SOLN: 4.0000 mg | Freq: Four times a day (QID) | INTRAMUSCULAR | Status: DC | PRN

## 2020-10-19 MED ORDER — BUPIVACAINE LIPOSOME 1.3 % IJ SUSP
INTRAMUSCULAR | Status: AC
  Filled 2020-10-19: qty 20

## 2020-10-19 MED ORDER — BISACODYL 10 MG RE SUPP: 10.0000 mg | Freq: Every day | RECTAL | Status: DC | PRN

## 2020-10-19 MED ORDER — HYDROMORPHONE HCL 1 MG/ML IJ SOLN
INTRAMUSCULAR | Status: AC
  Filled 2020-10-19: qty 1

## 2020-10-19 MED ORDER — MIDAZOLAM HCL 2 MG/2ML IJ SOLN
INTRAMUSCULAR | Status: AC
  Filled 2020-10-19: qty 2

## 2020-10-19 MED ORDER — MENTHOL 3 MG MT LOZG: 1.0000 | LOZENGE | OROMUCOSAL | Status: DC | PRN

## 2020-10-19 MED ORDER — PHENYLEPHRINE 40 MCG/ML (10ML) SYRINGE FOR IV PUSH (FOR BLOOD PRESSURE SUPPORT)
PREFILLED_SYRINGE | INTRAVENOUS | Status: AC
  Filled 2020-10-19: qty 10

## 2020-10-19 MED ORDER — ONDANSETRON HCL 4 MG PO TABS: 4.0000 mg | ORAL_TABLET | Freq: Four times a day (QID) | ORAL | Status: DC | PRN

## 2020-10-19 MED ORDER — ACETAMINOPHEN 500 MG PO TABS
1000.0000 mg | ORAL_TABLET | Freq: Once | ORAL | Status: AC
Start: 2020-10-19 — End: 2020-10-19
  Administered 2020-10-19: 1000 mg via ORAL
  Filled 2020-10-19: qty 2

## 2020-10-19 MED ORDER — LEVOTHYROXINE SODIUM 112 MCG PO TABS
112.0000 ug | ORAL_TABLET | Freq: Every day | ORAL | Status: DC
  Administered 2020-10-20: 112 ug via ORAL
  Filled 2020-10-19: qty 1

## 2020-10-19 MED ORDER — HYDROMORPHONE HCL 1 MG/ML IJ SOLN
0.5000 mg | INTRAMUSCULAR | Status: DC | PRN
  Administered 2020-10-19 (×2): 0.5 mg via INTRAVENOUS

## 2020-10-19 MED ORDER — PROPOFOL 10 MG/ML IV BOLUS
INTRAVENOUS | Status: AC
  Filled 2020-10-19: qty 40

## 2020-10-19 MED ORDER — THROMBIN 5000 UNITS EX SOLR
CUTANEOUS | Status: AC
  Filled 2020-10-19: qty 5000

## 2020-10-19 MED ORDER — CEFAZOLIN SODIUM-DEXTROSE 2-4 GM/100ML-% IV SOLN
2.0000 g | Freq: Three times a day (TID) | INTRAVENOUS | Status: AC
  Administered 2020-10-19 (×2): 2 g via INTRAVENOUS
  Filled 2020-10-19 (×2): qty 100

## 2020-10-19 MED ORDER — ONDANSETRON HCL 4 MG/2ML IJ SOLN
INTRAMUSCULAR | Status: DC | PRN
  Administered 2020-10-19: 4 mg via INTRAVENOUS

## 2020-10-19 MED ORDER — LACTATED RINGERS IV SOLN: INTRAVENOUS | Status: DC

## 2020-10-19 MED ORDER — BUPIVACAINE-EPINEPHRINE 0.5% -1:200000 IJ SOLN
INTRAMUSCULAR | Status: AC
  Filled 2020-10-19: qty 1

## 2020-10-19 MED ORDER — CHLORHEXIDINE GLUCONATE CLOTH 2 % EX PADS: 6.0000 | MEDICATED_PAD | Freq: Once | CUTANEOUS | Status: DC

## 2020-10-19 MED ORDER — MAGNESIUM OXIDE -MG SUPPLEMENT 400 (240 MG) MG PO TABS: 200.0000 mg | ORAL_TABLET | Freq: Every day | ORAL | Status: DC

## 2020-10-19 MED ORDER — DOCUSATE SODIUM 100 MG PO CAPS
100.0000 mg | ORAL_CAPSULE | Freq: Two times a day (BID) | ORAL | Status: DC
  Administered 2020-10-19: 100 mg via ORAL
  Filled 2020-10-19: qty 1

## 2020-10-19 MED ORDER — ACETAMINOPHEN 500 MG PO TABS
1000.0000 mg | ORAL_TABLET | Freq: Four times a day (QID) | ORAL | Status: DC
  Administered 2020-10-19: 1000 mg via ORAL
  Filled 2020-10-19: qty 2

## 2020-10-19 MED ORDER — PANTOPRAZOLE SODIUM 40 MG PO TBEC: 40.0000 mg | DELAYED_RELEASE_TABLET | Freq: Every day | ORAL | Status: DC

## 2020-10-19 MED ORDER — EPHEDRINE SULFATE 50 MG/ML IJ SOLN
INTRAMUSCULAR | Status: DC | PRN
  Administered 2020-10-19 (×3): 10 mg via INTRAVENOUS

## 2020-10-19 MED ORDER — GABAPENTIN 300 MG PO CAPS
300.0000 mg | ORAL_CAPSULE | Freq: Three times a day (TID) | ORAL | Status: DC | PRN
  Administered 2020-10-19: 300 mg via ORAL
  Filled 2020-10-19: qty 1

## 2020-10-19 MED ORDER — CEFAZOLIN SODIUM-DEXTROSE 2-4 GM/100ML-% IV SOLN
INTRAVENOUS | Status: AC
  Administered 2020-10-19: 2000 mg
  Filled 2020-10-19: qty 100

## 2020-10-19 MED ORDER — DEXTROSE 5 % IV SOLN
INTRAVENOUS | Status: DC | PRN
  Administered 2020-10-19: 3 g via INTRAVENOUS

## 2020-10-19 MED ORDER — SODIUM CHLORIDE 0.9% FLUSH: 3.0000 mL | INTRAVENOUS | Status: DC | PRN

## 2020-10-19 MED ORDER — ROCURONIUM BROMIDE 10 MG/ML (PF) SYRINGE
PREFILLED_SYRINGE | INTRAVENOUS | Status: DC | PRN
  Administered 2020-10-19 (×2): 10 mg via INTRAVENOUS
  Administered 2020-10-19: 100 mg via INTRAVENOUS
  Administered 2020-10-19: 20 mg via INTRAVENOUS

## 2020-10-19 MED ORDER — OXYCODONE HCL 5 MG PO TABS: 5.0000 mg | ORAL_TABLET | ORAL | Status: DC | PRN

## 2020-10-19 MED ORDER — CITALOPRAM HYDROBROMIDE 20 MG PO TABS
20.0000 mg | ORAL_TABLET | Freq: Every day | ORAL | Status: DC
  Administered 2020-10-19: 20 mg via ORAL
  Filled 2020-10-19: qty 1

## 2020-10-19 MED ORDER — OXYCODONE HCL 5 MG PO TABS
10.0000 mg | ORAL_TABLET | ORAL | Status: DC | PRN
  Administered 2020-10-19 (×2): 10 mg via ORAL
  Filled 2020-10-19 (×2): qty 2

## 2020-10-19 MED ORDER — BUPIVACAINE-EPINEPHRINE (PF) 0.5% -1:200000 IJ SOLN
INTRAMUSCULAR | Status: DC | PRN
  Administered 2020-10-19: 10 mL via PERINEURAL

## 2020-10-19 MED ORDER — BACITRACIN ZINC 500 UNIT/GM EX OINT
TOPICAL_OINTMENT | CUTANEOUS | Status: DC | PRN
  Administered 2020-10-19: 1 via TOPICAL

## 2020-10-19 MED ORDER — KETAMINE HCL 10 MG/ML IJ SOLN
INTRAMUSCULAR | Status: DC | PRN
  Administered 2020-10-19: 10 mg via INTRAVENOUS
  Administered 2020-10-19: 30 mg via INTRAVENOUS
  Administered 2020-10-19 (×2): 10 mg via INTRAVENOUS

## 2020-10-19 MED ORDER — LIDOCAINE 2% (20 MG/ML) 5 ML SYRINGE
INTRAMUSCULAR | Status: DC | PRN
  Administered 2020-10-19: 80 mg via INTRAVENOUS

## 2020-10-19 MED ORDER — METOPROLOL TARTRATE 25 MG PO TABS
50.0000 mg | ORAL_TABLET | Freq: Every morning | ORAL | Status: DC
  Administered 2020-10-20: 50 mg via ORAL
  Filled 2020-10-19: qty 2

## 2020-10-19 MED ORDER — FENTANYL CITRATE (PF) 250 MCG/5ML IJ SOLN
INTRAMUSCULAR | Status: DC | PRN
  Administered 2020-10-19: 100 ug via INTRAVENOUS
  Administered 2020-10-19 (×2): 25 ug via INTRAVENOUS

## 2020-10-19 MED ORDER — PHENOL 1.4 % MT LIQD
1.0000 | OROMUCOSAL | Status: DC | PRN
Start: 2020-10-19 — End: 2020-10-20

## 2020-10-19 MED ORDER — MORPHINE SULFATE (PF) 4 MG/ML IV SOLN: 4.0000 mg | INTRAVENOUS | Status: DC | PRN

## 2020-10-19 MED ORDER — PROPOFOL 10 MG/ML IV BOLUS
INTRAVENOUS | Status: DC | PRN
  Administered 2020-10-19: 150 mg via INTRAVENOUS

## 2020-10-19 MED ORDER — ACETAMINOPHEN 650 MG RE SUPP: 650.0000 mg | RECTAL | Status: DC | PRN

## 2020-10-19 MED ORDER — SUGAMMADEX SODIUM 200 MG/2ML IV SOLN
INTRAVENOUS | Status: DC | PRN
  Administered 2020-10-19: 200 mg via INTRAVENOUS

## 2020-10-19 MED ORDER — ORAL CARE MOUTH RINSE: 15.0000 mL | Freq: Once | OROMUCOSAL | Status: AC

## 2020-10-19 MED ORDER — SODIUM CHLORIDE 0.9 % IV SOLN: 250.0000 mL | INTRAVENOUS | Status: DC

## 2020-10-19 MED ORDER — CEFAZOLIN SODIUM-DEXTROSE 2-4 GM/100ML-% IV SOLN: 2.0000 g | INTRAVENOUS | Status: DC

## 2020-10-19 MED ORDER — HYDROCODONE-ACETAMINOPHEN 5-325 MG PO TABS
1.0000 | ORAL_TABLET | ORAL | Status: DC | PRN
  Administered 2020-10-19 – 2020-10-20 (×3): 2 via ORAL
  Filled 2020-10-19 (×3): qty 2

## 2020-10-19 MED ORDER — FENTANYL CITRATE (PF) 100 MCG/2ML IJ SOLN
25.0000 ug | INTRAMUSCULAR | Status: DC | PRN
  Administered 2020-10-19: 25 ug via INTRAVENOUS
  Administered 2020-10-19: 50 ug via INTRAVENOUS
  Administered 2020-10-19: 25 ug via INTRAVENOUS

## 2020-10-19 MED ORDER — BACITRACIN ZINC 500 UNIT/GM EX OINT
TOPICAL_OINTMENT | CUTANEOUS | Status: AC
  Filled 2020-10-19: qty 28.35

## 2020-10-19 SURGICAL SUPPLY — 66 items
APL SKNCLS STERI-STRIP NONHPOA (GAUZE/BANDAGES/DRESSINGS) ×1
BAG COUNTER SPONGE SURGICOUNT (BAG) ×2 IMPLANT
BAG SPNG CNTER NS LX DISP (BAG) ×1
BASKET BONE COLLECTION (BASKET) ×2 IMPLANT
BENZOIN TINCTURE PRP APPL 2/3 (GAUZE/BANDAGES/DRESSINGS) ×2 IMPLANT
BLADE CLIPPER SURG (BLADE) IMPLANT
BUR MATCHSTICK NEURO 3.0 LAGG (BURR) ×2 IMPLANT
BUR PRECISION FLUTE 6.0 (BURR) ×2 IMPLANT
CAGE ALTERA 10X31X10-14 15D (Cage) ×1 IMPLANT
CANISTER SUCT 3000ML PPV (MISCELLANEOUS) ×2 IMPLANT
CAP LOCK DLX THRD (Cap) ×4 IMPLANT
CARTRIDGE OIL MAESTRO DRILL (MISCELLANEOUS) ×1 IMPLANT
CNTNR URN SCR LID CUP LEK RST (MISCELLANEOUS) ×1 IMPLANT
CONT SPEC 4OZ STRL OR WHT (MISCELLANEOUS) ×2
COVER BACK TABLE 60X90IN (DRAPES) ×2 IMPLANT
DECANTER SPIKE VIAL GLASS SM (MISCELLANEOUS) ×2 IMPLANT
DIFFUSER DRILL AIR PNEUMATIC (MISCELLANEOUS) ×2 IMPLANT
DRAPE C-ARM 42X72 X-RAY (DRAPES) ×4 IMPLANT
DRAPE HALF SHEET 40X57 (DRAPES) ×2 IMPLANT
DRAPE LAPAROTOMY 100X72X124 (DRAPES) ×2 IMPLANT
DRAPE SURG 17X23 STRL (DRAPES) ×8 IMPLANT
DRSG OPSITE POSTOP 4X6 (GAUZE/BANDAGES/DRESSINGS) ×2 IMPLANT
ELECT BLADE 4.0 EZ CLEAN MEGAD (MISCELLANEOUS) ×2
ELECT REM PT RETURN 9FT ADLT (ELECTROSURGICAL) ×2
ELECTRODE BLDE 4.0 EZ CLN MEGD (MISCELLANEOUS) ×1 IMPLANT
ELECTRODE REM PT RTRN 9FT ADLT (ELECTROSURGICAL) ×1 IMPLANT
EVACUATOR 1/8 PVC DRAIN (DRAIN) IMPLANT
GAUZE 4X4 16PLY ~~LOC~~+RFID DBL (SPONGE) ×2 IMPLANT
GLOVE EXAM NITRILE XL STR (GLOVE) IMPLANT
GLOVE SURG ENC MOIS LTX SZ8 (GLOVE) ×2 IMPLANT
GLOVE SURG ENC MOIS LTX SZ8.5 (GLOVE) ×2 IMPLANT
GOWN STRL REUS W/ TWL LRG LVL3 (GOWN DISPOSABLE) IMPLANT
GOWN STRL REUS W/ TWL XL LVL3 (GOWN DISPOSABLE) ×2 IMPLANT
GOWN STRL REUS W/TWL 2XL LVL3 (GOWN DISPOSABLE) IMPLANT
GOWN STRL REUS W/TWL LRG LVL3 (GOWN DISPOSABLE)
GOWN STRL REUS W/TWL XL LVL3 (GOWN DISPOSABLE) ×4
HEMOSTAT POWDER KIT SURGIFOAM (HEMOSTASIS) ×3 IMPLANT
KIT BASIN OR (CUSTOM PROCEDURE TRAY) ×2 IMPLANT
KIT TURNOVER KIT B (KITS) ×2 IMPLANT
MILL MEDIUM DISP (BLADE) ×2 IMPLANT
NDL HYPO 21X1.5 SAFETY (NEEDLE) IMPLANT
NEEDLE HYPO 21X1.5 SAFETY (NEEDLE) IMPLANT
NEEDLE HYPO 22GX1.5 SAFETY (NEEDLE) ×2 IMPLANT
NS IRRIG 1000ML POUR BTL (IV SOLUTION) ×2 IMPLANT
OIL CARTRIDGE MAESTRO DRILL (MISCELLANEOUS) ×2
PACK LAMINECTOMY NEURO (CUSTOM PROCEDURE TRAY) ×2 IMPLANT
PAD ARMBOARD 7.5X6 YLW CONV (MISCELLANEOUS) ×6 IMPLANT
PATTIES SURGICAL .5 X1 (DISPOSABLE) IMPLANT
PATTIES SURGICAL 1X1 (DISPOSABLE) ×2 IMPLANT
PUTTY DBM 10CC CALC GRAN (Putty) ×1 IMPLANT
ROD CREO DLX CVD 6.35X40 (Rod) IMPLANT
ROD CURVED TI 6.35X40 (Rod) ×4 IMPLANT
SCREW CREO DLX POLY 6.5X40 (Screw) ×2 IMPLANT
SCREW PA CREO DLX 6.5X50 (Screw) ×2 IMPLANT
SPONGE NEURO XRAY DETECT 1X3 (DISPOSABLE) IMPLANT
SPONGE SURGIFOAM ABS GEL 100 (HEMOSTASIS) IMPLANT
SPONGE T-LAP 4X18 ~~LOC~~+RFID (SPONGE) IMPLANT
STRIP CLOSURE SKIN 1/2X4 (GAUZE/BANDAGES/DRESSINGS) ×2 IMPLANT
SUT VIC AB 1 CT1 18XBRD ANBCTR (SUTURE) ×2 IMPLANT
SUT VIC AB 1 CT1 8-18 (SUTURE) ×4
SUT VIC AB 2-0 CP2 18 (SUTURE) ×4 IMPLANT
SYR 20ML LL LF (SYRINGE) IMPLANT
TOWEL GREEN STERILE (TOWEL DISPOSABLE) ×2 IMPLANT
TOWEL GREEN STERILE FF (TOWEL DISPOSABLE) ×2 IMPLANT
TRAY FOLEY MTR SLVR 16FR STAT (SET/KITS/TRAYS/PACK) ×2 IMPLANT
WATER STERILE IRR 1000ML POUR (IV SOLUTION) ×2 IMPLANT

## 2020-10-19 NOTE — Progress Notes (Signed)
Orthopedic Tech Progress Note Patient Details:  Rohil Lesch Mar 22, 1961 [id number]  Ortho Devices Type of Ortho Device: Lumbar corsett Ortho Device/Splint Location: Back Ortho Device/Splint Interventions: Application   Post Interventions Patient Tolerated: Well Instructions Provided: Adjustment of device  Aizah Gehlhausen E Maryiah Olvey 10/19/2020, 6:35 PM

## 2020-10-19 NOTE — Anesthesia Procedure Notes (Signed)
Procedure Name: Intubation Date/Time: 10/19/2020 7:46 AM Performed by: Reeves Dam, CRNA Pre-anesthesia Checklist: Patient identified, Patient being monitored, Timeout performed, Emergency Drugs available and Suction available Patient Re-evaluated:Patient Re-evaluated prior to induction Oxygen Delivery Method: Circle system utilized Preoxygenation: Pre-oxygenation with 100% oxygen Induction Type: IV induction Ventilation: Mask ventilation without difficulty Laryngoscope Size: Glidescope and 4 Grade View: Grade I Tube type: Oral Tube size: 7.5 mm Number of attempts: 1 Airway Equipment and Method: Stylet, Video-laryngoscopy and Patient positioned with wedge pillow Placement Confirmation: ETT inserted through vocal cords under direct vision, positive ETCO2 and breath sounds checked- equal and bilateral Secured at: 24 cm Tube secured with: Tape Dental Injury: Teeth and Oropharynx as per pre-operative assessment

## 2020-10-19 NOTE — Transfer of Care (Signed)
Immediate Anesthesia Transfer of Care Note  Patient: Jerry Keller  Procedure(s) Performed: PLIF,IP,POSTERIOR INSTRUMENTATION, L5-S1 (Back)  Patient Location: PACU  Anesthesia Type:General  Level of Consciousness: awake, alert , oriented and patient cooperative  Airway & Oxygen Therapy: Patient Spontanous Breathing and Patient connected to face mask oxygen  Post-op Assessment: Report given to RN, Post -op Vital signs reviewed and stable and Patient moving all extremities X 4  Post vital signs: Reviewed and stable  Last Vitals:  Vitals Value Taken Time  BP 153/91 10/19/20 1207  Temp    Pulse 76 10/19/20 1216  Resp 15 10/19/20 1216  SpO2 100 % 10/19/20 1216  Vitals shown include unvalidated device data.  Last Pain:  Vitals:   10/19/20 4076  TempSrc:   PainSc: 6          Complications: No notable events documented.

## 2020-10-19 NOTE — H&P (Signed)
Subjective: The patient is a 60 year old obese white male who has complained of back and left great and right leg pain consistent with lumbosacral radiculopathy/neurogenic claudication.  He has failed medical management and was worked up with a lumbar MRI and lumbar x-rays which demonstrated an L5-S1 spondylolisthesis with foraminal stenosis.  I discussed the various treatment options.  He has decided proceed with surgery.  Past Medical History:  Diagnosis Date   Anxiety    GERD (gastroesophageal reflux disease)    Headache, cluster    Hyperlipidemia    a. Mild - statin started 02/2014.   Hypothyroidism    Non-obstructive CAD    a. 02/2014 Cath: LM nl, LAD 84m/d, LCX nl, RCA 20p/m, EF 55-60%-->Med Rx.   Palpitations    none recently   Sleep apnea    uses cpap    Past Surgical History:  Procedure Laterality Date   COLONOSCOPY N/A 07/30/2013   Procedure: COLONOSCOPY;  Surgeon: Beryle Beams, MD;  Location: [facility];  Service: Endoscopy;  Laterality: N/A;   LEFT HEART CATH AND CORONARY ANGIOGRAPHY N/A 06/23/2019   Procedure: LEFT HEART CATH AND CORONARY ANGIOGRAPHY;  Surgeon: [name], MD;  Location: [facility];  Service: Cardiovascular;  Laterality: N/A;   LEFT HEART CATHETERIZATION WITH CORONARY ANGIOGRAM N/A 03/08/2014   Procedure: LEFT HEART CATHETERIZATION WITH CORONARY ANGIOGRAM;  Surgeon: Burnell Blanks, MD;  Location: [facility];  Service: Cardiovascular;  Laterality: N/A;   TONSILLECTOMY AND ADENOIDECTOMY     as a child    Allergies  Allergen Reactions   Latex Rash    Social History   Tobacco Use   Smoking status: Former    Packs/day: 1.00    Years: 6.00    Pack years: 6.00    Types: Cigarettes    Quit date: 04/15/2000    Years since quitting: 20.5   Smokeless tobacco: Never  Substance Use Topics   Alcohol use: Yes    Comment: socially    Family History  Problem Relation Age of Onset   Hypertension Mother    Hyperlipidemia Father     Hypertension Father    Prostate cancer Father    Sleep apnea Father    Heart attack Maternal Uncle 28   Heart attack Maternal Grandfather 57   Prior to Admission medications   Medication Sig Start Date End Date Taking? Authorizing Provider  ALPRAZolam Duanne Moron) 0.5 MG tablet Take 0.5 mg by mouth 3 (three) times daily as needed for anxiety. 03/12/14  Yes [provider]  aspirin EC 81 MG tablet Take 81 mg by mouth daily.   Yes [provider]  Cholecalciferol (VITAMIN D3) 50 MCG (2000 UT) TABS Take 2,000 Units by mouth daily after breakfast.   Yes [provider]  citalopram (CELEXA) 20 MG tablet Take 20 mg by mouth daily.   Yes [provider]  esomeprazole (NEXIUM) 20 MG capsule Take 20 mg by mouth daily as needed (for reflux).   Yes [provider]  fluticasone (FLONASE) 50 MCG/ACT nasal spray Place 1-2 sprays into both nostrils at bedtime as needed (congestion).  06/14/16  Yes [provider]  folic acid (FOLVITE) 256 MCG tablet Take 400 mcg by mouth daily.   Yes [provider]  gabapentin (NEURONTIN) 300 MG capsule Take 300 mg by mouth 3 (three) times daily as needed (pain). 09/26/20  Yes [provider]  HYDROcodone-acetaminophen (NORCO/VICODIN) 5-325 MG per tablet Take 1 tablet by mouth at bedtime as needed for  severe pain.   Yes [provider]  levothyroxine (SYNTHROID) 112 MCG tablet Take 112 mcg by mouth daily before breakfast.   Yes [provider]  Magnesium 250 MG TABS Take 250 mg by mouth daily.    Yes [provider]  metoprolol tartrate (LOPRESSOR) 50 MG tablet Take 50 mg by mouth in the morning.   Yes [provider]  PRESCRIPTION MEDICATION See admin instructions. CPAP- At bedtime and during naps   Yes [provider]  tamsulosin (FLOMAX) 0.4 MG CAPS capsule Take 0.4 mg by mouth daily after breakfast.   Yes [provider]  vitamin B-12 (CYANOCOBALAMIN)  1000 MCG tablet Take 1,000 mcg by mouth daily.   Yes [provider]  atorvastatin (LIPITOR) 80 MG tablet Take 1 tablet (80 mg total) by mouth daily at 6 PM. Patient not taking: No sig reported 06/23/19   Mercy Riding, MD  clindamycin (CLEOCIN) 150 MG capsule Take 150 mg by mouth 3 (three) times daily. 10/18/20   [provider]  pantoprazole (PROTONIX) 40 MG tablet Take 1 tablet (40 mg total) by mouth daily. Patient not taking: No sig reported 03/09/14   Theora Gianotti, NP     Review of Systems  Positive ROS: As above  All other systems have been reviewed and were otherwise negative with the exception of those mentioned in the HPI and as above.  Objective: Vital signs in last 24 hours: Temp:  [98.1 F (36.7 C)] 98.1 F (36.7 C) (07/07 0553) Pulse Rate:  [71] 71 (07/07 0553) Resp:  [17] 17 (07/07 0553) BP: (143)/(75) 143/75 (07/07 0553) SpO2:  [96 %] 96 % (07/07 0553) Weight:  [122.5 kg] 122.5 kg (07/07 0553) Estimated body mass index is 36.62 kg/m as calculated from the following:   Height as of this encounter: 6' (1.829 m).   Weight as of this encounter: 122.5 kg.   General Appearance: Alert Head: Normocephalic, without obvious abnormality, atraumatic Eyes: PERRL, conjunctiva/corneas clear, EOM's intact,    Ears: Normal  Throat: Normal  Neck: Supple, Back: unremarkable Lungs: Clear to auscultation bilaterally, respirations unlabored Heart: Regular rate and rhythm, no murmur, rub or gallop Abdomen: Soft, non-tender Extremities: Extremities normal, atraumatic, no cyanosis or edema Skin: unremarkable  NEUROLOGIC:   Mental status: alert and oriented,Motor Exam - grossly normal Sensory Exam - grossly normal Reflexes:  Coordination - grossly normal Gait - grossly normal Balance - grossly normal Cranial Nerves: I: smell Not tested  II: visual acuity  OS: Normal  OD: Normal   II: visual fields Full to confrontation  II: pupils Equal, round,  reactive to light  III,VII: ptosis None  III,IV,VI: extraocular muscles  Full ROM  V: mastication Normal  V: facial light touch sensation  Normal  V,VII: corneal reflex  Present  VII: facial muscle function - upper  Normal  VII: facial muscle function - lower Normal  VIII: hearing Not tested  IX: soft palate elevation  Normal  IX,X: gag reflex Present  XI: trapezius strength  5/5  XI: sternocleidomastoid strength 5/5  XI: neck flexion strength  5/5  XII: tongue strength  Normal    Data Review Lab Results  Component Value Date   WBC 4.9 10/19/2020   HGB 13.9 10/19/2020   HCT 42.0 10/19/2020   MCV 99.1 10/19/2020   PLT 185 10/19/2020   Lab Results  Component Value Date   NA 138 06/23/2019   K 4.0 06/23/2019   CL 108 06/23/2019   CO2  21 (L) 06/23/2019   BUN 17 06/23/2019   CREATININE 1.09 06/23/2019   GLUCOSE 112 (H) 06/23/2019   No results found for: INR, PROTIME  Assessment/Plan: L5-S1 spondylolisthesis, from stenosis, lumbosacral radiculopathy, lumbago, neurogenic claudication: I have discussed the situation with the patient.  I have reviewed these imaging studies with him and pointed out the abnormalities.  We have discussed the various treatment options including surgery.  I have described the surgical treatment option of an L5-S1 decompression, instrumentation and fusion.  I have shown him surgical models.  I have given him a surgical pamphlet.  We have discussed the risk, benefits, alternatives, expected postop course, and likelihood of achieving our goals with surgery.  I have answered all his questions.  He has decided proceed with surgery.   Ophelia Charter 10/19/2020 7:15 AM

## 2020-10-19 NOTE — Op Note (Signed)
Brief history: The patient is a 60 year old white male who is complained of back and left great and right leg pain consistent with lumbosacral radiculopathy.  He failed medical management and was worked up with a lumbar MRI and lumbar x-rays which demonstrated an L5-S1 spondylolisthesis and foraminal stenosis.  I discussed the various treatment options with him.  He has decided proceed with surgery.  Preoperative diagnosis: L5-S1 spondylolisthesis, foraminal stenosis, degenerative disc disease, spinal stenosis compressing both the L5 and the S1 nerve roots; lumbago; lumbar radiculopathy; neurogenic claudication  Postoperative diagnosis: The same  Procedure: Bilateral L5-S1 laminotomy/foraminotomies/medial facetectomy to decompress the bilateral L5 and S1 nerve roots(the work required to do this was in addition to the work required to do the posterior lumbar interbody fusion because of the patient's spinal stenosis, facet arthropathy. Etc. requiring a wide decompression of the nerve roots.);  L5-S1 transforaminal lumbar interbody fusion with local morselized autograft bone and Zimmer DBM; insertion of interbody prosthesis at L5-S1 (globus peek expandable interbody prosthesis); posterior nonsegmental instrumentation from L5 to S1 with globus titanium pedicle screws and rods; posterior lateral arthrodesis at L5-S1 with local morselized autograft bone and Zimmer DBM.  Surgeon: Dr. Earle Gell  Asst.: Arnetha Massy, NP  Anesthesia: Gen. endotracheal  Estimated blood loss: 250 cc  Drains: None  Complications: None  Description of procedure: The patient was brought to the operating room by the anesthesia team. General endotracheal anesthesia was induced. The patient was turned to the prone position on the Wilson frame. The patient's lumbosacral region was then prepared with Betadine scrub and Betadine solution. Sterile drapes were applied.  I then injected the area to be incised with Marcaine with  epinephrine solution. I then used the scalpel to make a linear midline incision over the L5-S1 interspace. I then used electrocautery to perform a bilateral subperiosteal dissection exposing the spinous process and lamina of L5 and the upper sacrum. We then obtained intraoperative radiograph to confirm our location. We then inserted the Verstrac retractor to provide exposure.  I began the decompression by using the high speed drill to perform laminotomies at L5-S1 bilaterally. We then used the Kerrison punches to widen the laminotomy and removed the ligamentum flavum at L5-S1 bilaterally. We used the Kerrison punches to complete the L5 laminectomy/Gill procedure remove the medial facets at L5-S1 bilaterally. We performed wide foraminotomies about the bilateral L5 and S1 nerve roots completing the decompression.  We now turned our attention to the posterior lumbar interbody fusion. I used a scalpel to incise the intervertebral disc at L5-S1 bilaterally. I then performed a partial intervertebral discectomy at L5-S1 bilaterally using the pituitary forceps. We prepared the vertebral endplates at Z6-X0 bilaterally for the fusion by removing the soft tissues with the curettes. We then used the trial spacers to pick the appropriate sized interbody prosthesis. We prefilled his prosthesis with a combination of local morselized autograft bone that we obtained during the decompression as well as Zimmer DBM. We inserted the prefilled prosthesis into the interspace at L5-S1 from the left, we then turned and expanded the prosthesis. There was a good snug fit of the prosthesis in the interspace. We then filled and the remainder of the intervertebral disc space with local morselized autograft bone and Zimmer DBM. This completed the posterior lumbar interbody arthrodesis.  During the decompression and insertion of the prosthesis the assistant protected the thecal sac and nerve roots with the D'Errico retractor.  We now turned  attention to the instrumentation. Under fluoroscopic guidance we cannulated  the bilateral L5 and S1 pedicles with the bone probe. We then removed the bone probe. We then tapped the pedicle with a 5.5 millimeter tap. We then removed the tap. We probed inside the tapped pedicle with a ball probe to rule out cortical breaches. We then inserted a 6.5 x 40 and 50 millimeter pedicle screw into the L5 and S1 pedicles bilaterally under fluoroscopic guidance.  The inferior pedicle at L5 cracked so we removed the fragment with the Kerrison punches.  The left S1 pedicle screw was directed a bit laterally but had excellent bony purchase so I did not redirect it.  We then connected the unilateral pedicle screws with a lordotic rod. We compressed the construct and secured the rod in place with the caps. We then tightened the caps appropriately. This completed the instrumentation from L5-S1 bilaterally.  We now turned our attention to the posterior lateral arthrodesis at L5-S1. We used the high-speed drill to decorticate the remainder of the facets, pars, transverse process at L5-S1. We then applied a combination of local morselized autograft bone and Zimmer DBM over these decorticated posterior lateral structures. This completed the posterior lateral arthrodesis.  We then obtained hemostasis using bipolar electrocautery. We irrigated the wound out with bacitracin solution. We inspected the thecal sac and nerve roots and noted they were well decompressed. We then removed the retractor.  We injected Exparel . We reapproximated patient's thoracolumbar fascia with interrupted #1 Vicryl suture. We reapproximated patient's subcutaneous tissue with interrupted 2-0 Vicryl suture. The reapproximated patient's skin with Steri-Strips and benzoin. The wound was then coated with bacitracin ointment. A sterile dressing was applied. The drapes were removed. The patient was subsequently returned to the supine position where they were  extubated by the anesthesia team. He was then transported to the post anesthesia care unit in stable condition. All sponge instrument and needle counts were reportedly correct at the end of this case.

## 2020-10-20 LAB — BASIC METABOLIC PANEL
Anion gap: 7 (ref 5–15)
BUN: 13 mg/dL (ref 6–20)
CO2: 25 mmol/L (ref 22–32)
Calcium: 8.8 mg/dL — ABNORMAL LOW (ref 8.9–10.3)
Chloride: 103 mmol/L (ref 98–111)
Creatinine, Ser: 1.06 mg/dL (ref 0.61–1.24)
GFR, Estimated: >60 mL/min (ref >60)
Glucose, Bld: 198 mg/dL — ABNORMAL HIGH (ref 70–99)
Potassium: 4.1 mmol/L (ref 3.5–5.1)
Sodium: 135 mmol/L (ref 135–145)

## 2020-10-20 LAB — CBC
HCT: 36.6 % — ABNORMAL LOW (ref 39.0–52.0)
Hemoglobin: 12.3 g/dL — ABNORMAL LOW (ref 13.0–17.0)
MCH: 32.9 pg (ref 26.0–34.0)
MCHC: 33.6 g/dL (ref 30.0–36.0)
MCV: 97.9 fL (ref 80.0–100.0)
Platelets: 160 10*3/uL (ref 150–400)
RBC: 3.74 MIL/uL — ABNORMAL LOW (ref 4.22–5.81)
RDW: 11.9 % (ref 11.5–15.5)
WBC: 13.3 10*3/uL — ABNORMAL HIGH (ref 4.0–10.5)
nRBC: 0 % (ref 0.0–0.2)

## 2020-10-20 MED ORDER — CYCLOBENZAPRINE HCL 10 MG PO TABS: 10.0000 mg | ORAL_TABLET | Freq: Three times a day (TID) | ORAL | 0 refills | Status: DC | PRN

## 2020-10-20 MED ORDER — OXYCODONE-ACETAMINOPHEN 5-325 MG PO TABS: 1.0000 | ORAL_TABLET | ORAL | Status: DC | PRN

## 2020-10-20 MED ORDER — OXYCODONE-ACETAMINOPHEN 5-325 MG PO TABS: 1.0000 | ORAL_TABLET | ORAL | 0 refills | Status: DC | PRN

## 2020-10-20 MED ORDER — DOCUSATE SODIUM 100 MG PO CAPS: 100.0000 mg | ORAL_CAPSULE | Freq: Two times a day (BID) | ORAL | 0 refills | Status: DC

## 2020-10-20 MED FILL — Thrombin For Soln 5000 Unit: CUTANEOUS | Qty: 5000 | Status: AC

## 2020-10-20 NOTE — Discharge Summary (Signed)
Physician Discharge Summary  Patient ID: Jerry Keller MRN: [id number] DOB/AGE: 10-08-1960 60 y.o.  Admit date: 10/19/2020 Discharge date: 10/20/2020  Admission Diagnoses: Lumbosacral spondylolisthesis, lumbosacral foraminal stenosis, lumbosacral radiculopathy, lumbago  Discharge Diagnoses: The same Active Problems:   Spondylolisthesis of lumbosacral region   Discharged Condition: good  Hospital Course: I performed an L5-S1 decompression, instrumentation and fusion on patient on 10/19/2020.  The surgery went well.  The patient's postoperative course was unremarkable.  On postoperative day #1 the patient's leg pain was gone.  He requested discharge to home.  He was given written and oral discharge instructions.  All his questions were answered.  Consults: PT, OT, care management Significant Diagnostic Studies: None Treatments: L5-S1 decompression, instrumentation and fusion. Discharge Exam: Blood pressure (!) 157/84, pulse 77, temperature 98.2 F (36.8 C), temperature source Oral, resp. rate 20, height 6' (1.829 m), weight 122.5 kg, SpO2 99 %. The patient is alert and pleasant.  He looks well.  His strength is normal.  Disposition: Home  Discharge Instructions     Call MD for:  difficulty breathing, headache or visual disturbances   Complete by: As directed    Call MD for:  extreme fatigue   Complete by: As directed    Call MD for:  hives   Complete by: As directed    Call MD for:  persistant dizziness or light-headedness   Complete by: As directed    Call MD for:  persistant nausea and vomiting   Complete by: As directed    Call MD for:  redness, tenderness, or signs of infection (pain, swelling, redness, odor or green/yellow discharge around incision site)   Complete by: As directed    Call MD for:  severe uncontrolled pain   Complete by: As directed    Call MD for:  temperature >100.4   Complete by: As directed    Diet - low sodium heart healthy   Complete by: As  directed    Discharge instructions   Complete by: As directed    Call [phone] for a followup appointment. Take a stool softener while you are using pain medications.   Driving Restrictions   Complete by: As directed    Do not drive for 2 weeks.   Increase activity slowly   Complete by: As directed    Lifting restrictions   Complete by: As directed    Do not lift more than 5 pounds. No excessive bending or twisting.   May shower / Bathe   Complete by: As directed    Remove the dressing for 3 days after surgery.  You may shower, but leave the incision alone.   Remove dressing in 48 hours   Complete by: As directed       Allergies as of 10/20/2020       Reactions   Latex Rash        Medication List     STOP taking these medications    ALPRAZolam 0.5 MG tablet Commonly known as: XANAX   atorvastatin 80 MG tablet Commonly known as: LIPITOR   HYDROcodone-acetaminophen 5-325 MG tablet Commonly known as: NORCO/VICODIN   pantoprazole 40 MG tablet Commonly known as: PROTONIX       TAKE these medications    aspirin EC 81 MG tablet Take 81 mg by mouth daily.   citalopram 20 MG tablet Commonly known as: CELEXA Take 20 mg by mouth daily.   clindamycin 150 MG capsule Commonly known as: CLEOCIN Take 150 mg by mouth 3 (three)  times daily.   cyclobenzaprine 10 MG tablet Commonly known as: FLEXERIL Take 1 tablet (10 mg total) by mouth 3 (three) times daily as needed for muscle spasms.   docusate sodium 100 MG capsule Commonly known as: COLACE Take 1 capsule (100 mg total) by mouth 2 (two) times daily.   esomeprazole 20 MG capsule Commonly known as: NEXIUM Take 20 mg by mouth daily as needed (for reflux).   fluticasone 50 MCG/ACT nasal spray Commonly known as: FLONASE Place 1-2 sprays into both nostrils at bedtime as needed (congestion).   folic acid 379 MCG tablet Commonly known as: FOLVITE Take 400 mcg by mouth daily.   gabapentin 300 MG  capsule Commonly known as: NEURONTIN Take 300 mg by mouth 3 (three) times daily as needed (pain).   levothyroxine 112 MCG tablet Commonly known as: SYNTHROID Take 112 mcg by mouth daily before breakfast.   Magnesium 250 MG Tabs Take 250 mg by mouth daily.   metoprolol tartrate 50 MG tablet Commonly known as: LOPRESSOR Take 50 mg by mouth in the morning.   oxyCODONE-acetaminophen 5-325 MG tablet Commonly known as: PERCOCET/ROXICET Take 1-2 tablets by mouth every 4 (four) hours as needed for moderate pain.   PRESCRIPTION MEDICATION See admin instructions. CPAP- At bedtime and during naps   tamsulosin 0.4 MG Caps capsule Commonly known as: FLOMAX Take 0.4 mg by mouth daily after breakfast.   vitamin B-12 1000 MCG tablet Commonly known as: CYANOCOBALAMIN Take 1,000 mcg by mouth daily.   Vitamin D3 50 MCG (2000 UT) Tabs Take 2,000 Units by mouth daily after breakfast.         Signed: Ophelia Charter 10/20/2020, 6:40 AM

## 2020-10-20 NOTE — Progress Notes (Signed)
Patient was transported via wheelchair by volunteer for discharge home; in no acute distress nor complaints of pain nor discomfort; all belongings checked and accounted for; discharge instructions given to patient and her husband by RN and both verbalized understanding on the instructions given.

## 2020-10-20 NOTE — Evaluation (Signed)
Physical Therapy Evaluation Patient Details Name: Jerry Keller MRN: [id number] DOB: 01/29/1961 Today's Date: 10/20/2020   History of Present Illness  60 yo male s/p PLIF L5-S1 on 10/19/20. PMH including anxiety, hypothyroidism, and CAD.  Clinical Impression  PTA pt living with wife in single story home with level entry. Pt reports he was mod I for mobility and ADLs due to increased back pain and LE pain. Pt is currently limited in safe mobility by expected pain at surgical site and ROM limited by back precautions. Pt able to recall 3/3 back precautions at end of session. D/c plans remain appropriate and pt has no equipment needs.     Follow Up Recommendations No PT follow up;Supervision for mobility/OOB    Equipment Recommendations  None recommended by PT (has necessary equipment)       Precautions / Restrictions Precautions Precautions: Back Precaution Booklet Issued: Yes (comment) Precaution Comments: able to teach back precautions after education by OT in earlier session Required Braces or Orthoses: Spinal Brace Spinal Brace: Lumbar corset;Applied in sitting position;Applied in standing position Restrictions Weight Bearing Restrictions: No      Mobility  Bed Mobility               General bed mobility comments: up in room on entry    Transfers Overall transfer level: Independent                  Ambulation/Gait Ambulation/Gait assistance: Independent Gait Distance (Feet): 200 Feet Assistive device: None Gait Pattern/deviations: Step-through pattern;Decreased step length - right;Decreased step length - left;Wide base of support Gait velocity: slowed Gait velocity interpretation: >2.62 ft/sec, indicative of community ambulatory General Gait Details: pt with increased ER of hips with ambulaton and influence on back pain        Balance Overall balance assessment: No apparent balance deficits (not formally assessed)                                            Pertinent Vitals/Pain Pain Assessment: 0-10 Pain Score: 10-Worst pain ever Faces Pain Scale: Hurts a little bit Pain Location: 10-back 8-legs Pain Descriptors / Indicators: Sore Pain Intervention(s): Limited activity within patient's tolerance;Monitored during session;Repositioned    Home Living Family/patient expects to be discharged to:: Private residence Living Arrangements: Spouse/significant other Available Help at Discharge: Family Type of Home: House Home Access: Level entry     Home Layout: One level Home Equipment: Shower seat - built in;Bedside commode;Hand held shower head;Grab bars - tub/shower      Prior Function Level of Independence: Independent         Comments: ADLs, IADLs, drives, works        Extremity/Trunk Assessment   Upper Extremity Assessment Upper Extremity Assessment: Overall WFL for tasks assessed    Lower Extremity Assessment Lower Extremity Assessment: Overall WFL for tasks assessed    Cervical / Trunk Assessment Cervical / Trunk Assessment: Other exceptions Cervical / Trunk Exceptions: s/p back sx  Communication   Communication: No difficulties  Cognition Arousal/Alertness: Awake/alert Behavior During Therapy: WFL for tasks assessed/performed Overall Cognitive Status: Within Functional Limits for tasks assessed                                        General Comments General comments (skin integrity,  edema, etc.): VSS on RA, wife present throughout session        Assessment/Plan    PT Assessment Patient needs continued PT services  PT Problem List Decreased range of motion;Decreased activity tolerance;Decreased mobility;Pain       PT Treatment Interventions DME instruction;Gait training;Functional mobility training;Therapeutic activities;Therapeutic exercise;Balance training;Cognitive remediation;Patient/family education    PT Goals (Current goals can be found in the Care Plan  section)  Acute Rehab PT Goals Patient Stated Goal: Go home PT Goal Formulation: With patient/family Time For Goal Achievement: 11/03/20 Potential to Achieve Goals: Good    Frequency Min 4X/week    AM-PAC PT "6 Clicks" Mobility  Outcome Measure Help needed turning from your back to your side while in a flat bed without using bedrails?: None Help needed moving from lying on your back to sitting on the side of a flat bed without using bedrails?: None Help needed moving to and from a bed to a chair (including a wheelchair)?: None Help needed standing up from a chair using your arms (e.g., wheelchair or bedside chair)?: None Help needed to walk in hospital room?: None Help needed climbing 3-5 steps with a railing? : None 6 Click Score: 24    End of Session Equipment Utilized During Treatment: Back brace Activity Tolerance: Patient limited by pain Patient left: Other (comment);with family/visitor present (standing in room) Nurse Communication: Mobility status PT Visit Diagnosis: Other abnormalities of gait and mobility (R26.89);Difficulty in walking, not elsewhere classified (R26.2);Pain Pain - Right/Left: Right (bilateral) Pain - part of body: Leg (back)    Time: 7209-4709 PT Time Calculation (min) (ACUTE ONLY): 17 min   Charges:   PT Evaluation $PT Eval Low Complexity: 1 Low          Derren Suydam B. Migdalia Dk PT, DPT Acute Rehabilitation Services Pager [phone] Office [phone]   [place] 10/20/2020, 9:24 AM

## 2020-10-20 NOTE — Evaluation (Signed)
Occupational Therapy Evaluation Patient Details Name: Jerry Keller MRN: [id number] DOB: 1960-07-10 Today's Date: 10/20/2020    History of Present Illness  60 yo male s/p PLIF L5-S1 on 10/19/20. PMH including anxiety, hypothyroidism, and CAD.    Clinical Impression   PTA, pt was living with his wife and was independent. Currently, pt performing ADLs and functional mobility at Mod I level with increased time as needed. Provided education and handout on back precautions, brace management, grooming, LB ADLs, toileting, and shower transfer; pt demonstrated understanding and verbalized. Answered all pt questions. Recommend dc home once medically stable per physician. All acute OT needs met and will sign off. Thank you.    Follow Up Recommendations  No OT follow up    Equipment Recommendations  None recommended by OT    Recommendations for Other Services       Precautions / Restrictions Precautions Precautions: Back Precaution Booklet Issued: Yes (comment) Precaution Comments: Providing education and handout for back precautions and compensatory techniques for ADLs Required Braces or Orthoses: Spinal Brace Spinal Brace: Lumbar corset;Applied in sitting position;Applied in standing position      Mobility Bed Mobility               General bed mobility comments: In recliner upon arrival. Reviewing log roll techniques    Transfers Overall transfer level: Independent                    Balance Overall balance assessment: No apparent balance deficits (not formally assessed)                                         ADL either performed or assessed with clinical judgement   ADL Overall ADL's : Modified independent                                       General ADL Comments: Providing education on back precautions and compensatory techniques for ADLs including grooming, bed mobility, brace management, LB ADLs, shower transfer, and  toileting. Pt performing at Mod I level with increased time     Vision         Perception     Praxis      Pertinent Vitals/Pain Pain Assessment: Faces Faces Pain Scale: Hurts a little bit Pain Location: sx site Pain Descriptors / Indicators: Sore Pain Intervention(s): Monitored during session     Hand Dominance     Extremity/Trunk Assessment Upper Extremity Assessment Upper Extremity Assessment: Overall WFL for tasks assessed   Lower Extremity Assessment Lower Extremity Assessment: Overall WFL for tasks assessed   Cervical / Trunk Assessment Cervical / Trunk Assessment: Other exceptions Cervical / Trunk Exceptions: s/p back sx   Communication Communication Communication: No difficulties   Cognition Arousal/Alertness: Awake/alert Behavior During Therapy: WFL for tasks assessed/performed Overall Cognitive Status: Within Functional Limits for tasks assessed                                     General Comments       Exercises     Shoulder Instructions      Home Living Family/patient expects to be discharged to:: Private residence Living Arrangements: Spouse/significant other Available Help at Discharge: Family Type of  Home: House Home Access: Level entry     Home Layout: One level     Bathroom Shower/Tub: Walk-in shower;Tub/shower unit   [employer]: Standard     Home Equipment: Shower seat - built in;Bedside commode;Hand held shower head;Grab bars - tub/shower          Prior Functioning/Environment Level of Independence: Independent        Comments: ADLs, IADLs, drives, works        OT Problem List: Decreased activity tolerance;Decreased range of motion      OT Treatment/Interventions:      OT Goals(Current goals can be found in the care plan section) Acute Rehab OT Goals Patient Stated Goal: Go home OT Goal Formulation: All assessment and education complete, DC therapy  OT Frequency:     Barriers to D/C:             Co-evaluation              AM-PAC OT "6 Clicks" Daily Activity     Outcome Measure Help from another person eating meals?: None Help from another person taking care of personal grooming?: None Help from another person toileting, which includes using toliet, bedpan, or urinal?: None Help from another person bathing (including washing, rinsing, drying)?: None Help from another person to put on and taking off regular upper body clothing?: None Help from another person to put on and taking off regular lower body clothing?: None 6 Click Score: 24   End of Session Equipment Utilized During Treatment: Back brace Nurse Communication: Mobility status  Activity Tolerance: Patient tolerated treatment well Patient left: in chair;with call bell/phone within reach  OT Visit Diagnosis: Unsteadiness on feet (R26.81);Other abnormalities of gait and mobility (R26.89)                Time: 3845-3646 OT Time Calculation (min): 13 min Charges:  OT General Charges $OT Visit: 1 Visit OT Evaluation $OT Eval Low Complexity: [place], OTR/L Acute Rehab Pager: [phone] Office: [place] 10/20/2020, 9:01 AM

## 2020-10-20 NOTE — Anesthesia Postprocedure Evaluation (Signed)
Anesthesia Post Note  Patient: Jerry Keller  Procedure(s) Performed: PLIF,IP,POSTERIOR INSTRUMENTATION, L5-S1 (Back)     Patient location during evaluation: PACU Anesthesia Type: General Level of consciousness: awake and alert Pain management: pain level controlled Vital Signs Assessment: post-procedure vital signs reviewed and stable Respiratory status: spontaneous breathing, nonlabored ventilation, respiratory function stable and patient connected to nasal cannula oxygen Cardiovascular status: blood pressure returned to baseline and stable Postop Assessment: no apparent nausea or vomiting Anesthetic complications: no   No notable events documented.  Last Vitals:  Vitals:   10/20/20 0333 10/20/20 0721  BP: (!) 157/84 134/86  Pulse: 77 90  Resp: 20 18  Temp: 36.8 C 36.5 C  SpO2: 99% 98%    Last Pain:  Vitals:   10/20/20 0721  TempSrc: Oral  PainSc:                  Luc Shammas L Zainah Steven

## 2020-10-26 MED FILL — Sodium Chloride IV Soln 0.9%: INTRAVENOUS | Qty: 1000 | Status: AC

## 2020-10-26 MED FILL — Heparin Sodium (Porcine) Inj 1000 Unit/ML: INTRAMUSCULAR | Qty: 30 | Status: AC

## 2020-12-29 ENCOUNTER — Other Ambulatory Visit: Payer: Self-pay | Admitting: Student

## 2020-12-29 DIAGNOSIS — R159 Full incontinence of feces: Secondary | ICD-10-CM

## 2021-01-01 ENCOUNTER — Other Ambulatory Visit: Payer: BC Managed Care – PPO

## 2021-01-10 ENCOUNTER — Other Ambulatory Visit: Payer: BC Managed Care – PPO

## 2021-01-12 ENCOUNTER — Other Ambulatory Visit: Payer: BC Managed Care – PPO

## 2021-02-04 ENCOUNTER — Ambulatory Visit
Admission: RE | Admit: 2021-02-04 | Discharge: 2021-02-04 | Disposition: A | Discharge status: Home / Self Care | Payer: BC Managed Care – PPO | Source: Ambulatory Visit | Attending: Student | Admitting: Student

## 2021-02-04 DIAGNOSIS — R159 Full incontinence of feces: Secondary | ICD-10-CM

## 2021-02-04 MED ORDER — GADOBENATE DIMEGLUMINE 529 MG/ML IV SOLN
20.0000 mL | Freq: Once | INTRAVENOUS | Status: AC | PRN
  Administered 2021-02-04: 20 mL via INTRAVENOUS

## 2021-02-12 ENCOUNTER — Ambulatory Visit: Payer: BC Managed Care – PPO | Attending: Student | Admitting: Physical Therapy

## 2021-02-12 ENCOUNTER — Other Ambulatory Visit: Payer: Self-pay

## 2021-02-12 ENCOUNTER — Encounter: Payer: Self-pay | Admitting: Physical Therapy

## 2021-02-12 DIAGNOSIS — M6281 Muscle weakness (generalized): Secondary | ICD-10-CM | POA: Diagnosis present

## 2021-02-12 DIAGNOSIS — M62830 Muscle spasm of back: Secondary | ICD-10-CM | POA: Insufficient documentation

## 2021-02-12 DIAGNOSIS — M545 Low back pain, unspecified: Secondary | ICD-10-CM | POA: Diagnosis present

## 2021-02-12 NOTE — Patient Instructions (Signed)
Access Code: [email or web address] URL: [email or web address] Date: 02/12/2021 Prepared by: [facility]  Exercises Supine Transversus Abdominis Bracing - Hands on Stomach - 1 x daily - 7 x weekly - 2-3 sets - 10 reps Hooklying Sequential Leg March and Lower - 1 x daily - 7 x weekly - 2-3 sets - 10 reps   [facility] [address] [place], [place] [address] Phone # [phone] Fax [phone]

## 2021-02-12 NOTE — Therapy (Signed)
[place] @ [place], [place], [address] Phone: [phone]   Fax:  [phone]  Physical Therapy Evaluation  Patient Details  Name: Jerry Keller MRN: [id number] Date of Birth: 1960-12-20 Referring Provider (PT): Bradly Bienenstock, NP   Encounter Date: 02/12/2021   PT End of Session - 02/12/21 1131     Visit Number 1    Date for PT Re-Evaluation 03/26/21    Authorization Type [employer]    Authorization Time Period 02/12/21 to 03/26/21    PT Start Time 0945   pt arrived late   PT Stop Time 1022    PT Time Calculation (min) 37 min    Activity Tolerance Patient limited by pain    Behavior During Therapy [facility] for tasks assessed/performed             Past Medical History:  Diagnosis Date   Anxiety    GERD (gastroesophageal reflux disease)    Headache, cluster    Hyperlipidemia    a. Mild - statin started 02/2014.   Hypothyroidism    Non-obstructive CAD    a. 02/2014 Cath: LM nl, LAD 72m/d, LCX nl, RCA 20p/m, EF 55-60%-->Med Rx.   Palpitations    none recently   Sleep apnea    uses cpap    Past Surgical History:  Procedure Laterality Date   COLONOSCOPY N/A 07/30/2013   Procedure: COLONOSCOPY;  Surgeon: Beryle Beams, MD;  Location: [facility] ENDOSCOPY;  Service: Endoscopy;  Laterality: N/A;   LEFT HEART CATH AND CORONARY ANGIOGRAPHY N/A 06/23/2019   Procedure: LEFT HEART CATH AND CORONARY ANGIOGRAPHY;  Surgeon: [name], MD;  Location: [facility] CV LAB;  Service: Cardiovascular;  Laterality: N/A;   LEFT HEART CATHETERIZATION WITH CORONARY ANGIOGRAM N/A 03/08/2014   Procedure: LEFT HEART CATHETERIZATION WITH CORONARY ANGIOGRAM;  Surgeon: Burnell Blanks, MD;  Location: [facility] CATH LAB;  Service: Cardiovascular;  Laterality: N/A;   TONSILLECTOMY AND ADENOIDECTOMY     as a child    There were no vitals filed for this visit.    Subjective Assessment - 02/12/21 0948     Subjective Pt states that he  initially had burning pain in the Lt LE, so he underwent L5/S1 fusion on 10/19/20. He has returned to work and lately has noticed his pain is increasing (mid September) and he is now having pain in the upper BLEs and low back. He notes occasionally feeling LE weakness and has problems fully controlling his bowels. He saw his surgeon who repeated imaging and this is unchanged. He is currently out of work until he can get things figured out. He is only able to sleep for about 2.5 hours at night because of the pain.    Pertinent History anxiety, GERD, L heart cath    Limitations Lifting;House hold activities    Patient Stated Goals improve sleep, improve pain to allow him to return to work    Currently in Pain? Yes    Pain Score 5     Pain Location Back    Pain Orientation Lower;Mid    Pain Descriptors / Indicators Aching;Tightness    Pain Type Surgical pain    Pain Radiating Towards Bilateral thighs    Pain Onset More than a month ago    Pain Frequency Constant    Aggravating Factors  any prolonged activity    Effect of Pain on Daily Activities currently unable to work  [facility] PT Assessment - 02/12/21 0001       Assessment   Medical Diagnosis s/p arthrodesis L5/S1    Referring Provider (PT) Trinda Pascal Summer, NP    Onset Date/Surgical Date 10/19/20    Next MD Visit couple of months      Precautions   Precautions None      Balance Screen   Has the patient fallen in the past 6 months No    Has the patient had a decrease in activity level because of a fear of falling?  No    Is the patient reluctant to leave their home because of a fear of falling?  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      Cognition   Overall Cognitive Status Within Functional Limits for tasks assessed      Observation/Other Assessments   Focus on Therapeutic Outcomes (FOTO)  next visit       Sensation   Additional Comments none at this time      ROM / Strength   AROM / PROM / Strength Strength;PROM      PROM   Overall PROM Comments Bilateral hip IR- 0 deg      Strength   Overall Strength Comments hip abduction: 3/5 MMT bilateral      Flexibility   Soft Tissue Assessment /Muscle Length yes    Hamstrings WNL      Bed Mobility   Bed Mobility Rolling Right;Rolling Left;Supine to Sit    Rolling Right --   heavy use of hands-poor trunk activation   Rolling Left --   heavy use of hands-poor trunk activation   Supine to Sit --   via log roll to his side-heavy use of hands     Transfers   Five time sit to stand comments  35 sec UE on thighs                        Objective measurements completed on examination: See above findings.       [place] Adult PT Treatment/Exercise - 02/12/21 0001       Ambulation/Gait   Gait Comments minimal arm swing/trunk rotation noted, decreased step length bilaterally, pt slouched      Exercises   Exercises Lumbar      Lumbar Exercises: Supine   Other Supine Lumbar Exercises heavy cuing for activation of transverse abdominus-timing of breathing; addition of slow bent knee march x5 reps each side                     PT Education - 02/12/21 1129     Education Details importance of building back up to full activity following surgery; proper core activation to avoid bearing down; implemented HEP    Person(s) Educated Patient    Methods Explanation;Handout;Verbal cues;Demonstration;Tactile cues    Comprehension Verbalized understanding;Returned demonstration              PT Short Term Goals - 02/12/21 1250       PT SHORT TERM GOAL #1   Title Pt will be independent with his initial HEP to improve flexibility and understanding of proper body mechanics.    Time 3    Period Weeks    Status New               PT Long Term Goals -  02/12/21 1251       PT LONG TERM GOAL #1   Title Pt will have  atleast 4/5 MMT of BLE which will improve his efficiency with walking and other ADLs.    Time 6    Period Weeks    Status New      PT LONG TERM GOAL #2   Title Pt will report atleast 60% improvement in his back pain and function from the start of PT which will improve his ability to participate in work related tasks.    Time 6    Period Weeks    Status New      PT LONG TERM GOAL #3   Title Pt will be able to complete 5x sit to stand in less than 14 sec without UE support to reflect improvements in his trunk/LE strength.    Time 6    Period Weeks    Status New      PT LONG TERM GOAL #4   Title Pt will be able to complete bed mobility such as rolling and supine to/from sit without the need for UE support or increase in low back pain.    Time 6    Period Weeks    Status New      PT LONG TERM GOAL #5   Title Pt will be able to complete 10 straight single leg raises on each LE without abdominal coning to reflect improvements in abdominal strength and endurance.    Time 6    Period Weeks    Status New                    Plan - 02/12/21 1133     Clinical Impression Statement Pt was referred to OPPT with complaints of worsening low back pain and radiating pain into the upper LEs since med September when he was released back to work. He underwent L5-S1 decompression with fusion on 10/19/20. Pt arrived late so evaluation was somewhat limited. He presents with poor trunk strength, unable to complete a supine straight leg raise without strainging and doming of the abdomen. He also takes increased time to complete bed mobility, requiring heavy use of his UEs. Pt has notable weakness of the glutes, 3/5 MMT during today's evaluation, and he required 35 sec to complete 5x sit to stand. Pt has reported occasionally having trouble with bowel control, he denies full lack of control and states that this is usually at random times. He tends to "bear down" when trying to use his core, which could  be contributing to his current complaint. PT spent a portion of today's evaluation educating him on proper abdominal activation. He would greatly benefit from skilled PT to address his limitations in trunk/LE strength and progress his activity participation for full return to work and other daily activity.    Personal Factors and Comorbidities Age;Comorbidity 1;Time since onset of injury/illness/exacerbation    Comorbidities surgery 10/19/20    Examination-Activity Limitations Stand;Lift;Bed Mobility;Transfers    Examination-Participation Restrictions Occupation;Yard Work    [employer] Evolving/Moderate complexity    Clinical Decision Making Moderate    Rehab Potential Good    PT Frequency 2x / week    PT Duration 6 weeks    PT Treatment/Interventions ADLs/Self Care Home Management;Cryotherapy;Moist Heat;Neuromuscular re-education;Balance training;Therapeutic exercise;Therapeutic activities;Functional mobility training;Patient/family education;Gait training;Manual techniques;Dry needling;Passive range of motion;Taping    PT Next Visit Plan progression of deep abdominal strength and proper muscle activation; thoracic and hip flexibility; hip strength  abduction/extension    PT Home Exercise Plan Access Code: [email or web address]             Patient will benefit from skilled therapeutic intervention in order to improve the following deficits and impairments:  Abnormal gait, Decreased range of motion, Increased muscle spasms, Decreased activity tolerance, Pain, Impaired flexibility, Hypomobility, Improper body mechanics, Postural dysfunction, Decreased mobility, Decreased strength  Visit Diagnosis: Low back pain, unspecified back pain laterality, unspecified chronicity, unspecified whether sciatica present  Muscle weakness (generalized)  Muscle spasm of back     Problem List Patient Active Problem List   Diagnosis Date Noted   Spondylolisthesis of lumbosacral region  10/19/2020   Chronic tension-type headache, not intractable 06/23/2020   Hyperlipidemia    GERD (gastroesophageal reflux disease)    Excessive daytime sleepiness 01/12/2018   Seasonal and perennial allergic rhinitis 01/12/2018   CAD (coronary artery disease) 03/25/2014   Pain of left calf 03/25/2014   Morbid obesity ([place]) 03/09/2014   Midsternal chest pain 03/09/2014   Unstable angina ([place]) 03/08/2014   Hypothyroidism 03/08/2014   Chest pain    OSA (obstructive sleep apnea) 07/27/2012   12:55 PM,02/12/21 Sherol Dade PT, DPT [facility] [place], [place], [address] Phone: [phone]   Fax:  [phone]  Name: Jerry Keller MRN: [id number] Date of Birth: 03/21/1961

## 2021-02-14 ENCOUNTER — Other Ambulatory Visit: Payer: Self-pay

## 2021-02-14 ENCOUNTER — Ambulatory Visit: Payer: BC Managed Care – PPO | Attending: Student

## 2021-02-14 DIAGNOSIS — M545 Low back pain, unspecified: Secondary | ICD-10-CM | POA: Insufficient documentation

## 2021-02-14 DIAGNOSIS — M62830 Muscle spasm of back: Secondary | ICD-10-CM | POA: Diagnosis present

## 2021-02-14 DIAGNOSIS — M6281 Muscle weakness (generalized): Secondary | ICD-10-CM | POA: Insufficient documentation

## 2021-02-14 NOTE — Therapy (Signed)
[facility] @ [place], [place], [address] Phone: [phone]   Fax:  [phone]  Physical Therapy Treatment  Patient Details  Name: Jerry Keller MRN: [id number] Date of Birth: [birth year] Referring Provider (PT): [place], NP   Encounter Date: 02/14/2021   PT End of Session - 02/14/21 0925     Visit Number 2    Date for PT Re-Evaluation 03/26/21    Authorization Type BCBS    Authorization Time Period 02/12/21 to 03/26/21    PT Start Time 0850    PT Stop Time 1000    PT Time Calculation (min) 70 min    Activity Tolerance Patient limited by pain    Behavior During Therapy [facility] for tasks assessed/performed             Past Medical History:  Diagnosis Date   Anxiety    GERD (gastroesophageal reflux disease)    Headache, cluster    Hyperlipidemia    a. Mild - statin started 02/2014.   Hypothyroidism    Non-obstructive CAD    a. 02/2014 Cath: LM nl, LAD 11m/d, LCX nl, RCA 20p/m, EF 55-60%-->Med Rx.   Palpitations    none recently   Sleep apnea    uses cpap    Past Surgical History:  Procedure Laterality Date   COLONOSCOPY N/A 07/30/2013   Procedure: COLONOSCOPY;  Surgeon: Beryle Beams, MD;  Location: [facility];  Service: Endoscopy;  Laterality: N/A;   LEFT HEART CATH AND CORONARY ANGIOGRAPHY N/A 06/23/2019   Procedure: LEFT HEART CATH AND CORONARY ANGIOGRAPHY;  Surgeon: [name], MD;  Location: [place] CV [facility];  Service: Cardiovascular;  Laterality: N/A;   LEFT HEART CATHETERIZATION WITH CORONARY ANGIOGRAM N/A 03/08/2014   Procedure: LEFT HEART CATHETERIZATION WITH CORONARY ANGIOGRAM;  Surgeon: Burnell Blanks, MD;  Location: [facility] CATH LAB;  Service: Cardiovascular;  Laterality: N/A;   TONSILLECTOMY AND ADENOIDECTOMY     as a child    There were no vitals filed for this visit.   Subjective Assessment - 02/14/21 0855     Subjective Patient states he has been really sore  since last visit but not so much due to therapy but due to went back to work to help out for audit period.  He explains that he has to get into his bathtub and does stretching and mobility just to get going for the day.  He rates his pain at 6/10 today.    Pertinent History anxiety, GERD, L heart cath    Limitations Lifting;House hold activities    Patient Stated Goals improve sleep, improve pain to allow him to return to work                               [facility] Adult PT Treatment/Exercise - 02/14/21 0001       Exercises   Exercises Knee/Hip      Lumbar Exercises: Stretches   Active Hamstring Stretch Right;Left;2 reps;30 seconds    Pelvic Tilt 10 reps    Quad Stretch Right;Left;1 rep;30 seconds   standing with foot in chair behind, holding onto counter   ITB Stretch Right;Left;2 reps;30 seconds    Figure 4 Stretch --      Lumbar Exercises: Aerobic   Nustep L1 x 5 min      Manual Therapy   Manual Therapy Manual Traction   Patient in supine: manual lumbar traction x 5,  holding 10 sec                    PT Education - 02/14/21 0920     Education Details Educated on controlling irritation to nerve root when doing hamstring stretches,  importance of flexibility to allow surgical area to heal fully.  Educated on anatomy of the pelvis and leg muscle attachments and how flexlibility affects the position of the spine.  Educated on diaphragmatic breathing and engaging lower abdominals for pelvic tilt.  Instructed on breathing techniques during stretches and exercises as he became light headed today.  Patient had multiple questions throughout session today about where he was feeling pain.  Took time to explain why he was feeling pain and what was "ok pain" vs. "we need to modify" pain or "we need to stop that activity" pain.    Person(s) Educated Patient    Methods Explanation;Demonstration;Verbal cues    Comprehension Verbalized understanding;Returned  demonstration;Verbal cues required;Tactile cues required              PT Short Term Goals - 02/12/21 1250       PT SHORT TERM GOAL #1   Title Pt will be independent with his initial HEP to improve flexibility and understanding of proper body mechanics.    Time 3    Period Weeks    Status New               PT Long Term Goals - 02/12/21 1251       PT LONG TERM GOAL #1   Title Pt will have atleast 4/5 MMT of BLE which will improve his efficiency with walking and other ADLs.    Time 6    Period Weeks    Status New      PT LONG TERM GOAL #2   Title Pt will report atleast 60% improvement in his back pain and function from the start of PT which will improve his ability to participate in work related tasks.    Time 6    Period Weeks    Status New      PT LONG TERM GOAL #3   Title Pt will be able to complete 5x sit to stand in less than 14 sec without UE support to reflect improvements in his trunk/LE strength.    Time 6    Period Weeks    Status New      PT LONG TERM GOAL #4   Title Pt will be able to complete bed mobility such as rolling and supine to/from sit without the need for UE support or increase in low back pain.    Time 6    Period Weeks    Status New      PT LONG TERM GOAL #5   Title Pt will be able to complete 10 straight single leg raises on each LE without abdominal coning to reflect improvements in abdominal strength and endurance.    Time 6    Period Weeks    Status New                   Plan - 02/14/21 0925     Clinical Impression Statement Patients symptoms are poorly controlled at this time due to severe flexibility issues and postural compensations from pain he experienced prior to surgery.  He experienced some burning in posterior thighs initially with hamstring stretches and needed verbal cues to isoloate hamstrings.  He had an abundance of questions and needed  heavy teaching and educating throughout the session today regarding his  condition, therefore treatment was limited. Patients post treatment pain is 8/10.  We will likely have to progress slowly with him.    Personal Factors and Comorbidities Age;Comorbidity 1;Time since onset of injury/illness/exacerbation    Comorbidities surgery 10/19/20    Examination-Activity Limitations Stand;Lift;Bed Mobility;Transfers    Examination-Participation Restrictions Occupation;Yard Work    [employer] officer Evolving/Moderate complexity    Rehab Potential Good    PT Frequency 2x / week    PT Duration 6 weeks    PT Treatment/Interventions ADLs/Self Care Home Management;Cryotherapy;Moist Heat;Neuromuscular re-education;Balance training;Therapeutic exercise;Therapeutic activities;Functional mobility training;Patient/family education;Gait training;Manual techniques;Dry needling;Passive range of motion;Taping    PT Next Visit Plan Progress flexibility as tolerated.  Add in core strengthening.    PT Home Exercise Plan Access Code: [email or web address] added hamstring and quad stretching    Consulted and Agree with Plan of Care Patient             Patient will benefit from skilled therapeutic intervention in order to improve the following deficits and impairments:  Abnormal gait, Decreased range of motion, Increased muscle spasms, Decreased activity tolerance, Pain, Impaired flexibility, Hypomobility, Improper body mechanics, Postural dysfunction, Decreased mobility, Decreased strength  Visit Diagnosis: Low back pain, unspecified back pain laterality, unspecified chronicity, unspecified whether sciatica present  Muscle weakness (generalized)  Muscle spasm of back     Problem List Patient Active Problem List   Diagnosis Date Noted   Spondylolisthesis of lumbosacral region 10/19/2020   Chronic tension-type headache, not intractable 06/23/2020   Hyperlipidemia    GERD (gastroesophageal reflux disease)    Excessive daytime sleepiness 01/12/2018   Seasonal and perennial  allergic rhinitis 01/12/2018   CAD (coronary artery disease) 03/25/2014   Pain of left calf 03/25/2014   Morbid obesity ([place]) 03/09/2014   Midsternal chest pain 03/09/2014   Unstable angina ([place]) 03/08/2014   Hypothyroidism 03/08/2014   Chest pain    OSA (obstructive sleep apnea) 07/27/2012    Isabel Caprice, PT 02/14/2021, 10:13 AM  [facility] @ [place], [place], [address] Phone: [phone]   Fax:  [phone]  Name: Jerry Keller MRN: [id number] Date of Birth: 02-02-1961

## 2021-02-19 ENCOUNTER — Encounter: Payer: Self-pay | Admitting: Rehabilitative and Restorative Service Providers"

## 2021-02-19 ENCOUNTER — Ambulatory Visit: Payer: BC Managed Care – PPO | Admitting: Rehabilitative and Restorative Service Providers"

## 2021-02-19 ENCOUNTER — Other Ambulatory Visit: Payer: Self-pay

## 2021-02-19 DIAGNOSIS — M545 Low back pain, unspecified: Secondary | ICD-10-CM

## 2021-02-19 DIAGNOSIS — M6281 Muscle weakness (generalized): Secondary | ICD-10-CM

## 2021-02-19 DIAGNOSIS — M62830 Muscle spasm of back: Secondary | ICD-10-CM

## 2021-02-19 NOTE — Therapy (Signed)
[facility] @ [place], [place], [address] Phone: [phone]   Fax:  [phone]  Physical Therapy Treatment  Patient Details  Name: Jerry Keller MRN: [id number] Date of Birth: 14-Oct-1960 Referring Provider (PT): [place], NP   Encounter Date: 02/19/2021   PT End of Session - 02/19/21 0936     Visit Number 3    Date for PT Re-Evaluation 03/26/21    Authorization Type BCBS    Authorization Time Period 02/12/21 to 03/26/21    PT Start Time 0930    PT Stop Time 1010    PT Time Calculation (min) 40 min    Activity Tolerance Patient limited by pain    Behavior During Therapy [facility] for tasks assessed/performed             Past Medical History:  Diagnosis Date   Anxiety    GERD (gastroesophageal reflux disease)    Headache, cluster    Hyperlipidemia    a. Mild - statin started 02/2014.   Hypothyroidism    Non-obstructive CAD    a. 02/2014 Cath: LM nl, LAD 50m/d, LCX nl, RCA 20p/m, EF 55-60%-->Med Rx.   Palpitations    none recently   Sleep apnea    uses cpap    Past Surgical History:  Procedure Laterality Date   COLONOSCOPY N/A 07/30/2013   Procedure: COLONOSCOPY;  Surgeon: Beryle Beams, MD;  Location: [facility];  Service: Endoscopy;  Laterality: N/A;   LEFT HEART CATH AND CORONARY ANGIOGRAPHY N/A 06/23/2019   Procedure: LEFT HEART CATH AND CORONARY ANGIOGRAPHY;  Surgeon: [name], MD;  Location: [place] CV LAB;  Service: Cardiovascular;  Laterality: N/A;   LEFT HEART CATHETERIZATION WITH CORONARY ANGIOGRAM N/A 03/08/2014   Procedure: LEFT HEART CATHETERIZATION WITH CORONARY ANGIOGRAM;  Surgeon: Burnell Blanks, MD;  Location: [facility] CATH LAB;  Service: Cardiovascular;  Laterality: N/A;   TONSILLECTOMY AND ADENOIDECTOMY     as a child    There were no vitals filed for this visit.   Subjective Assessment - 02/19/21 0935     Subjective My back gave me a fit on Friday after  working out, but I am doing better now.  I am doing my exercises.    Patient Stated Goals improve sleep, improve pain to allow him to return to work    Currently in Pain? Yes    Pain Score 8     Pain Location Back    Pain Orientation Lower;Right    Pain Descriptors / Indicators Stabbing    Pain Type Surgical pain                               [facility] Adult PT Treatment/Exercise - 02/19/21 0001       Transfers   Five time sit to stand comments  35.8 sec UE on thighs      Lumbar Exercises: Aerobic   Nustep L3 x6 min with PT present to discuss progress.      Knee/Hip Exercises: Stretches   Other Knee/Hip Stretches Pball roll out in sitting x10 reps      Knee/Hip Exercises: Seated   Ball Squeeze 2x10    Clamshell with TheraBand Red   2x10   Hamstring Curl Strengthening;Both;2 sets;10 reps    Hamstring Limitations red tband      Manual Therapy   Manual Therapy Soft tissue mobilization;Myofascial release    Manual therapy comments  in prone    Soft tissue mobilization to lumbar paraspinals and R piriformis    Myofascial Release myofascial release to R piraformis              Trigger Point Dry Needling - 02/19/21 0001     Consent Given? Yes    Education Handout Provided Yes    Muscles Treated Back/Hip Piriformis    Piriformis Response Twitch response elicited   R piriformis                  PT Education - 02/19/21 1013     Education Details Pt provided with education for dry needling and provided handout.  He was agreeable to treatment and verbalized understanding.    Person(s) Educated Patient    Methods Explanation;Handout    Comprehension Verbalized understanding              PT Short Term Goals - 02/19/21 1059       PT SHORT TERM GOAL #1   Title Pt will be independent with his initial HEP to improve flexibility and understanding of proper body mechanics.    Status Achieved               PT Long Term Goals - 02/19/21  1059       PT LONG TERM GOAL #1   Title Pt will have atleast 4/5 MMT of BLE which will improve his efficiency with walking and other ADLs.    Status On-going      PT LONG TERM GOAL #2   Title Pt will report atleast 60% improvement in his back pain and function from the start of PT which will improve his ability to participate in work related tasks.    Status On-going      PT LONG TERM GOAL #3   Title Pt will be able to complete 5x sit to stand in less than 14 sec without UE support to reflect improvements in his trunk/LE strength.    Status On-going      PT LONG TERM GOAL #4   Title Pt will be able to complete bed mobility such as rolling and supine to/from sit without the need for UE support or increase in low back pain.    Status On-going      PT LONG TERM GOAL #5   Title Pt will be able to complete 10 straight single leg raises on each LE without abdominal coning to reflect improvements in abdominal strength and endurance.    Status On-going                   Plan - 02/19/21 1022     Clinical Impression Statement Pt with reports of increased pain, especially in R piriformis area.  Pt educated on dry needling and he was agreeable to treatment.  Following dry needling, he reported decrease in pain from 8/10 to 5.5/10 and reports that his R hip felt looser. Pt is compliant with stretching HEP, so strengthening was added to session today to begin to progress pt towrds goal related activities.    PT Treatment/Interventions ADLs/Self Care Home Management;Cryotherapy;Moist Heat;Neuromuscular re-education;Balance training;Therapeutic exercise;Therapeutic activities;Functional mobility training;Patient/family education;Gait training;Manual techniques;Dry needling;Passive range of motion;Taping    PT Next Visit Plan Progress flexibility as tolerated.  Add in core strengthening. Assess response to dry needling and manual therapy.    Consulted and Agree with Plan of Care Patient              Patient  will benefit from skilled therapeutic intervention in order to improve the following deficits and impairments:  Abnormal gait, Decreased range of motion, Increased muscle spasms, Decreased activity tolerance, Pain, Impaired flexibility, Hypomobility, Improper body mechanics, Postural dysfunction, Decreased mobility, Decreased strength  Visit Diagnosis: Low back pain, unspecified back pain laterality, unspecified chronicity, unspecified whether sciatica present  Muscle weakness (generalized)  Muscle spasm of back     Problem List Patient Active Problem List   Diagnosis Date Noted   Spondylolisthesis of lumbosacral region 10/19/2020   Chronic tension-type headache, not intractable 06/23/2020   Hyperlipidemia    GERD (gastroesophageal reflux disease)    Excessive daytime sleepiness 01/12/2018   Seasonal and perennial allergic rhinitis 01/12/2018   CAD (coronary artery disease) 03/25/2014   Pain of left calf 03/25/2014   Morbid obesity ([place]) 03/09/2014   Midsternal chest pain 03/09/2014   Unstable angina ([place]) 03/08/2014   Hypothyroidism 03/08/2014   Chest pain    OSA (obstructive sleep apnea) 07/27/2012    Juel Burrow, PT, DPT 02/19/2021, 11:01 AM  [facility] @ [place], [place], [address] Phone: [phone]   Fax:  [phone]  Name: Kaynan Klonowski MRN: [id number] Date of Birth: 1961-01-17

## 2021-02-19 NOTE — Patient Instructions (Signed)

## 2021-02-23 ENCOUNTER — Ambulatory Visit: Payer: BC Managed Care – PPO

## 2021-02-23 ENCOUNTER — Other Ambulatory Visit: Payer: Self-pay

## 2021-02-23 DIAGNOSIS — M545 Low back pain, unspecified: Secondary | ICD-10-CM | POA: Diagnosis not present

## 2021-02-23 DIAGNOSIS — M6281 Muscle weakness (generalized): Secondary | ICD-10-CM

## 2021-02-23 DIAGNOSIS — M62830 Muscle spasm of back: Secondary | ICD-10-CM

## 2021-02-23 NOTE — Therapy (Signed)
[facility] @ [place], [place], [address] Phone: [phone]   Fax:  [phone]  Physical Therapy Treatment  Patient Details  Name: Jerry Keller MRN: [id number] Date of Birth: Mar 25, 1961 Referring Provider (PT): Bradly Bienenstock, NP   Encounter Date: 02/23/2021   PT End of Session - 02/23/21 1005     Visit Number 4    Date for PT Re-Evaluation 03/26/21    Authorization Type BCBS    Authorization Time Period 02/12/21 to 03/26/21    PT Start Time 0935    PT Stop Time 1015    PT Time Calculation (min) 40 min    Activity Tolerance Patient limited by pain    Behavior During Therapy [facility] for tasks assessed/performed             Past Medical History:  Diagnosis Date   Anxiety    GERD (gastroesophageal reflux disease)    Headache, cluster    Hyperlipidemia    a. Mild - statin started 02/2014.   Hypothyroidism    Non-obstructive CAD    a. 02/2014 Cath: LM nl, LAD 32m/d, LCX nl, RCA 20p/m, EF 55-60%-->Med Rx.   Palpitations    none recently   Sleep apnea    uses cpap    Past Surgical History:  Procedure Laterality Date   COLONOSCOPY N/A 07/30/2013   Procedure: COLONOSCOPY;  Surgeon: Beryle Beams, MD;  [facility]: [facility];  Service: Endoscopy;  Laterality: N/A;   LEFT HEART CATH AND CORONARY ANGIOGRAPHY N/A 06/23/2019   Procedure: LEFT HEART CATH AND CORONARY ANGIOGRAPHY;  Surgeon: [name], MD;  Location: [place] CV LAB;  Service: Cardiovascular;  Laterality: N/A;   LEFT HEART CATHETERIZATION WITH CORONARY ANGIOGRAM N/A 03/08/2014   Procedure: LEFT HEART CATHETERIZATION WITH CORONARY ANGIOGRAM;  Surgeon: Burnell Blanks, MD;  Location: [facility] CATH LAB;  Service: Cardiovascular;  Laterality: N/A;   TONSILLECTOMY AND ADENOIDECTOMY     as a child    There were no vitals filed for this visit.   Subjective Assessment - 02/23/21 0939     Subjective Patient states he felt a lot better on  his right side after dry needling.  He states the left side is bothering him today.  He rates left at 7-7.5/10 today.    Pertinent History anxiety, GERD, L heart cath    Limitations Lifting;House hold activities    Patient Stated Goals improve sleep, improve pain to allow him to return to work    Currently in Pain? Yes    Pain Score 7     Pain Location Back    Pain Orientation Left    Pain Descriptors / Indicators Aching;Shooting    Pain Type Chronic pain    Pain Onset More than a month ago    Pain Frequency Several days a week                               [facility] Adult PT Treatment/Exercise - 02/23/21 0001       Lumbar Exercises: Stretches   Active Hamstring Stretch Right;Left;2 reps;30 seconds    Lower Trunk Rotation 5 reps   side to side 5 times each side   Hip Flexor Stretch 2 reps;30 seconds   standing with foot on elevated treatment table holding onto chair   Pelvic Tilt 10 reps    Quad Stretch Right;Left;1 rep;30 seconds   standing with foot in chair  behind, holding onto counter   ITB Stretch Right;Left;2 reps;30 seconds    Piriformis Stretch 2 reps;30 seconds                     PT Education - 02/23/21 1004     Education Details Educated patient on need to focus on exercises vs. treating symptoms so that we restore good mechanics.    Person(s) Educated Patient    Methods Explanation    Comprehension Verbalized understanding              PT Short Term Goals - 02/19/21 1059       PT SHORT TERM GOAL #1   Title Pt will be independent with his initial HEP to improve flexibility and understanding of proper body mechanics.    Status Achieved               PT Long Term Goals - 02/19/21 1059       PT LONG TERM GOAL #1   Title Pt will have atleast 4/5 MMT of BLE which will improve his efficiency with walking and other ADLs.    Status On-going      PT LONG TERM GOAL #2   Title Pt will report atleast 60% improvement in his back  pain and function from the start of PT which will improve his ability to participate in work related tasks.    Status On-going      PT LONG TERM GOAL #3   Title Pt will be able to complete 5x sit to stand in less than 14 sec without UE support to reflect improvements in his trunk/LE strength.    Status On-going      PT LONG TERM GOAL #4   Title Pt will be able to complete bed mobility such as rolling and supine to/from sit without the need for UE support or increase in low back pain.    Status On-going      PT LONG TERM GOAL #5   Title Pt will be able to complete 10 straight single leg raises on each LE without abdominal coning to reflect improvements in abdominal strength and endurance.    Status On-going                   Plan - 02/23/21 1006     Clinical Impression Statement Patient reports right side pain much improved but left side still bothersome today.  He becomes quite focused on treating his symptoms at times, foregoing his exercises and doing hot baths, heating pad, ice and wants to purchase vibrating chair pad.   He would benefit from making exercises a priority along with these modalities.  He is very compliant and well motivated but pain focused at times.    Personal Factors and Comorbidities Age;Comorbidity 1;Time since onset of injury/illness/exacerbation    Comorbidities surgery 10/19/20    Examination-Activity Limitations Stand;Lift;Bed Mobility;Transfers    Examination-Participation Restrictions Occupation;Yard Work    [employer] officer Evolving/Moderate complexity    Clinical Decision Making Moderate    Rehab Potential Good    PT Frequency 2x / week    PT Duration 6 weeks    PT Treatment/Interventions ADLs/Self Care Home Management;Cryotherapy;Moist Heat;Neuromuscular re-education;Balance training;Therapeutic exercise;Therapeutic activities;Functional mobility training;Patient/family education;Gait training;Manual techniques;Dry  needling;Passive range of motion;Taping    PT Next Visit Plan Progress flexibility as tolerated.  Add in core strengthening. Continue dry needling and manual therapy.    PT Home Exercise Plan Access Code: [email or web address]  Consulted and Agree with Plan of Care Patient             Patient will benefit from skilled therapeutic intervention in order to improve the following deficits and impairments:  Abnormal gait, Decreased range of motion, Increased muscle spasms, Decreased activity tolerance, Pain, Impaired flexibility, Hypomobility, Improper body mechanics, Postural dysfunction, Decreased mobility, Decreased strength  Visit Diagnosis: Muscle weakness (generalized)  Muscle spasm of back  Low back pain, unspecified back pain laterality, unspecified chronicity, unspecified whether sciatica present     Problem List Patient Active Problem List   Diagnosis Date Noted   Spondylolisthesis of lumbosacral region 10/19/2020   Chronic tension-type headache, not intractable 06/23/2020   Hyperlipidemia    GERD (gastroesophageal reflux disease)    Excessive daytime sleepiness 01/12/2018   Seasonal and perennial allergic rhinitis 01/12/2018   CAD (coronary artery disease) 03/25/2014   Pain of left calf 03/25/2014   Morbid obesity ([place]) 03/09/2014   Midsternal chest pain 03/09/2014   Unstable angina ([place]) 03/08/2014   Hypothyroidism 03/08/2014   Chest pain    OSA (obstructive sleep apnea) 07/27/2012    Anderson Malta B. Braeson Rupe, PT 02/23/2210:08 AM   [facility] @ [place], [place], [address] Phone: [phone]   Fax:  [phone]  Name: Jerry Keller MRN: [id number] Date of Birth: 1960-12-10

## 2021-02-23 NOTE — Patient Instructions (Signed)
Continue current HEP.   Limit work stress.  Emphasis on sleep/rest.  Taking time to do the exercises vs. Focusing too much on treating the symptoms with heat, vibrating cushion, hot bath with jets in tub.

## 2021-02-26 ENCOUNTER — Other Ambulatory Visit: Payer: Self-pay

## 2021-02-26 ENCOUNTER — Encounter: Payer: Self-pay | Admitting: Rehabilitative and Restorative Service Providers"

## 2021-02-26 ENCOUNTER — Ambulatory Visit: Payer: BC Managed Care – PPO | Admitting: Rehabilitative and Restorative Service Providers"

## 2021-02-26 DIAGNOSIS — M6281 Muscle weakness (generalized): Secondary | ICD-10-CM

## 2021-02-26 DIAGNOSIS — M62830 Muscle spasm of back: Secondary | ICD-10-CM

## 2021-02-26 DIAGNOSIS — M545 Low back pain, unspecified: Secondary | ICD-10-CM

## 2021-02-26 NOTE — Therapy (Signed)
[facility] @ [place], [place], [address] Phone: [phone]   Fax:  [phone]  Physical Therapy Treatment  Patient Details  Name: Jerry Keller MRN: [id number] Date of Birth: [birth year] Referring Provider (PT): Bradly Bienenstock, NP   Encounter Date: 02/26/2021   PT End of Session - 02/26/21 1103     Visit Number 5    Date for PT Re-Evaluation 03/26/21    Authorization Type BCBS    Authorization Time Period 02/12/21 to 03/26/21    PT Start Time 1055    PT Stop Time 1135    PT Time Calculation (min) 40 min    Activity Tolerance Patient limited by pain    Behavior During Therapy [facility] for tasks assessed/performed             Past Medical History:  Diagnosis Date   Anxiety    GERD (gastroesophageal reflux disease)    Headache, cluster    Hyperlipidemia    a. Mild - statin started 02/2014.   Hypothyroidism    Non-obstructive CAD    a. 02/2014 Cath: LM nl, LAD 32md, LCX nl, RCA 20p/m, EF 55-60%-->Med Rx.   Palpitations    none recently   Sleep apnea    uses cpap    Past Surgical History:  Procedure Laterality Date   COLONOSCOPY N/A 07/30/2013   Procedure: COLONOSCOPY;  Surgeon: PBeryle Beams MD;  [facility]: [facility];  Service: Endoscopy;  Laterality: N/A;   LEFT HEART CATH AND CORONARY ANGIOGRAPHY N/A 06/23/2019   Procedure: LEFT HEART CATH AND CORONARY ANGIOGRAPHY;  Surgeon: CSherren Mocha MD;  [place];  Service: Cardiovascular;  Laterality: N/A;   LEFT HEART CATHETERIZATION WITH CORONARY ANGIOGRAM N/A 03/08/2014   Procedure: LEFT HEART CATHETERIZATION WITH CORONARY ANGIOGRAM;  Surgeon: CBurnell Blanks MD;  [facility];  Service: Cardiovascular;  Laterality: N/A;   TONSILLECTOMY AND ADENOIDECTOMY     as a child    There were no vitals filed for this visit.   Subjective Assessment - 02/26/21 1102     Subjective Pt with no new complaints     Currently in Pain? Yes    Pain Score 5     Pain Location Back    Pain Orientation Left    Pain Descriptors / Indicators Tightness;Throbbing    Pain Type Chronic pain                               [facility] Adult PT Treatment/Exercise - 02/26/21 0001       Lumbar Exercises: Stretches   Passive Hamstring Stretch Right;Left;2 reps;20 seconds    Passive Hamstring Stretch Limitations in standing at stairs    Hip Flexor Stretch Right;Left;2 reps;20 seconds    Hip Flexor Stretch Limitations in standing at stairs    Piriformis Stretch Right;Left;2 reps;20 seconds    Piriformis Stretch Limitations in sitting    Other Lumbar Stretch Exercise seated physioball 3 way roll out x5 each      Lumbar Exercises: Aerobic   Nustep L5 x6 min with PT present to discuss progress.      Lumbar Exercises: Machines for Strengthening   Other Lumbar Machine Exercise Lat Pull 35# 2x10 in standing              Trigger Point Dry Needling - 02/26/21 0001     Consent Given? Yes    Education Handout Provided  Previously provided    Muscles Treated Back/Hip Lumbar multifidi    Lumbar multifidi Response Twitch response elicited;Palpable increased muscle length                     PT Short Term Goals - 02/19/21 1059       PT SHORT TERM GOAL #1   Title Pt will be independent with his initial HEP to improve flexibility and understanding of proper body mechanics.    Status Achieved               PT Long Term Goals - 02/26/21 1137       PT LONG TERM GOAL #1   Title Pt will have atleast 4/5 MMT of BLE which will improve his efficiency with walking and other ADLs.    Status On-going      PT LONG TERM GOAL #2   Title Pt will report atleast 60% improvement in his back pain and function from the start of PT which will improve his ability to participate in work related tasks.    Status Partially Met      PT LONG TERM GOAL #3   Title Pt will be able to complete 5x sit to  stand in less than 14 sec without UE support to reflect improvements in his trunk/LE strength.    Status On-going      PT LONG TERM GOAL #4   Title Pt will be able to complete bed mobility such as rolling and supine to/from sit without the need for UE support or increase in low back pain.    Status Partially Met      PT LONG TERM GOAL #5   Title Pt will be able to complete 10 straight single leg raises on each LE without abdominal coning to reflect improvements in abdominal strength and endurance.    Status On-going                   Plan - 02/26/21 1134     Clinical Impression Statement Pt reports that he feels that he has made 65% progress since starting PT.  He feels that he has gotten great relief out of dry needling. Pt has been doing his HEP and stretching at home.  He required min cuing for posture and technique during stretching.    PT Treatment/Interventions ADLs/Self Care Home Management;Cryotherapy;Moist Heat;Neuromuscular re-education;Balance training;Therapeutic exercise;Therapeutic activities;Functional mobility training;Patient/family education;Gait training;Manual techniques;Dry needling;Passive range of motion;Taping    PT Next Visit Plan Progress flexibility as tolerated.  Add in core strengthening. Continue dry needling and manual therapy.    Consulted and Agree with Plan of Care Patient             Patient will benefit from skilled therapeutic intervention in order to improve the following deficits and impairments:  Abnormal gait, Decreased range of motion, Increased muscle spasms, Decreased activity tolerance, Pain, Impaired flexibility, Hypomobility, Improper body mechanics, Postural dysfunction, Decreased mobility, Decreased strength  Visit Diagnosis: Muscle weakness (generalized)  Muscle spasm of back  Low back pain, unspecified back pain laterality, unspecified chronicity, unspecified whether sciatica present     Problem List Patient Active  Problem List   Diagnosis Date Noted   Spondylolisthesis of lumbosacral region 10/19/2020   Chronic tension-type headache, not intractable 06/23/2020   Hyperlipidemia    GERD (gastroesophageal reflux disease)    Excessive daytime sleepiness 01/12/2018   Seasonal and perennial allergic rhinitis 01/12/2018   CAD (coronary artery disease) 03/25/2014  Pain of left calf 03/25/2014   Morbid obesity ([place]) 03/09/2014   Midsternal chest pain 03/09/2014   Unstable angina ([place]) 03/08/2014   Hypothyroidism 03/08/2014   Chest pain    OSA (obstructive sleep apnea) 07/27/2012    Juel Burrow, PT, DPT 02/26/2021, 11:42 AM  [facility] @ [place], [place], [address] Phone: [phone]   Fax:  [phone]  Name: Jerry Keller MRN: [id number] Date of Birth: 11-14-1960

## 2021-03-02 ENCOUNTER — Other Ambulatory Visit: Payer: Self-pay

## 2021-03-02 ENCOUNTER — Encounter: Payer: Self-pay | Admitting: Rehabilitative and Restorative Service Providers"

## 2021-03-02 ENCOUNTER — Ambulatory Visit: Payer: BC Managed Care – PPO | Admitting: Rehabilitative and Restorative Service Providers"

## 2021-03-02 DIAGNOSIS — M545 Low back pain, unspecified: Secondary | ICD-10-CM

## 2021-03-02 DIAGNOSIS — M6281 Muscle weakness (generalized): Secondary | ICD-10-CM

## 2021-03-02 DIAGNOSIS — M62830 Muscle spasm of back: Secondary | ICD-10-CM

## 2021-03-02 NOTE — Patient Instructions (Addendum)
Go to the bathroom every 2 hours  Sitting- contract the pelvic floor holding for 7 seconds 5 times for 5 times per day.

## 2021-03-02 NOTE — Therapy (Signed)
[place] [facility] @ [place], [place], [address] Phone: [phone]   Fax:  [phone]  Physical Therapy Treatment  Patient Details  Name: Jerry Keller MRN: [id number] Date of Birth: 1961-01-18 Referring Provider (PT): Bradly Bienenstock, NP   Encounter Date: 03/02/2021   PT End of Session - 03/02/21 1012     Visit Number 6    Date for PT Re-Evaluation 03/26/21    Authorization Type BCBS    Authorization Time Period 02/12/21 to 03/26/21    PT Start Time 0931    PT Stop Time 1000    PT Time Calculation (min) 29 min    Activity Tolerance Other (comment)   limited due to fear of incontinence episode   Behavior During Therapy [name] for tasks assessed/performed             Past Medical History:  Diagnosis Date   Anxiety    GERD (gastroesophageal reflux disease)    Headache, cluster    Hyperlipidemia    a. Mild - statin started 02/2014.   Hypothyroidism    Non-obstructive CAD    a. 02/2014 Cath: LM nl, LAD 93md, LCX nl, RCA 20p/m, EF 55-60%-->Med Rx.   Palpitations    none recently   Sleep apnea    uses cpap    Past Surgical History:  Procedure Laterality Date   COLONOSCOPY N/A 07/30/2013   Procedure: COLONOSCOPY;  Surgeon: PBeryle Beams MD;  [facility];  Service: Endoscopy;  Laterality: N/A;   LEFT HEART CATH AND CORONARY ANGIOGRAPHY N/A 06/23/2019   Procedure: LEFT HEART CATH AND CORONARY ANGIOGRAPHY;  Surgeon: CSherren Mocha MD;  [facility];  Service: Cardiovascular;  Laterality: N/A;   LEFT HEART CATHETERIZATION WITH CORONARY ANGIOGRAM N/A 03/08/2014   Procedure: LEFT HEART CATHETERIZATION WITH CORONARY ANGIOGRAM;  Surgeon: CBurnell Blanks MD;  [facility];  Service: Cardiovascular;  Laterality: N/A;   TONSILLECTOMY AND ADENOIDECTOMY     as a child    There were no vitals filed for this visit.   Subjective Assessment - 03/02/21 0956     Subjective  Pt reports that he tried walking yesterday with his wife and he had an episode of bowel incontinence.  Pt states that he had some of these problems, but it had stopped and he thought that it had gotten better.  However, on Tuesday of this week, he had an episode of incontinence and again last night after walking with his wife.    Currently in Pain? Yes    Pain Score 5     Pain Location Back                               [facility] Adult PT Treatment/Exercise - 03/02/21 0001       Lumbar Exercises: Stretches   Pelvic Tilt 10 reps    Pelvic Tilt Limitations with cuing for ab contraction and pelvic floor contraction      Lumbar Exercises: Seated   Other Seated Lumbar Exercises Pelvic Floor Contractions 5x7 sec hold with cuing for breathing                     PT Education - 03/02/21 1011     Education Details Educated pt on pelvic floor and core stability and timed voiding.    Person(s) Educated Patient    Methods Explanation;Verbal cues    Comprehension  Verbalized understanding;Returned demonstration              PT Short Term Goals - 02/19/21 1059       PT SHORT TERM GOAL #1   Title Pt will be independent with his initial HEP to improve flexibility and understanding of proper body mechanics.    Status Achieved               PT Long Term Goals - 02/26/21 1137       PT LONG TERM GOAL #1   Title Pt will have atleast 4/5 MMT of BLE which will improve his efficiency with walking and other ADLs.    Status On-going      PT LONG TERM GOAL #2   Title Pt will report atleast 60% improvement in his back pain and function from the start of PT which will improve his ability to participate in work related tasks.    Status Partially Met      PT LONG TERM GOAL #3   Title Pt will be able to complete 5x sit to stand in less than 14 sec without UE support to reflect improvements in his trunk/LE strength.    Status On-going      PT LONG TERM GOAL #4    Title Pt will be able to complete bed mobility such as rolling and supine to/from sit without the need for UE support or increase in low back pain.    Status Partially Met      PT LONG TERM GOAL #5   Title Pt will be able to complete 10 straight single leg raises on each LE without abdominal coning to reflect improvements in abdominal strength and endurance.    Status On-going                   Plan - 03/02/21 1013     Clinical Impression Statement Educated pt about reaching out to his MD to obtain a sooner appointment due to 2 incidents of incontinence of bowel and bladder this week.  Focus of session today on education for pelvic floor weakness and importance of stability exercises.  Educated pt on timed voiding every 2 hours.    PT Treatment/Interventions ADLs/Self Care Home Management;Cryotherapy;Moist Heat;Neuromuscular re-education;Balance training;Therapeutic exercise;Therapeutic activities;Functional mobility training;Patient/family education;Gait training;Manual techniques;Dry needling;Passive range of motion;Taping    PT Next Visit Plan Follow up if pt able to secure a sooner MD appointment. Progress flexibility as tolerated.  Add in core strengthening. Continue dry needling and manual therapy as needed.    Consulted and Agree with Plan of Care Patient             Patient will benefit from skilled therapeutic intervention in order to improve the following deficits and impairments:  Abnormal gait, Decreased range of motion, Increased muscle spasms, Decreased activity tolerance, Pain, Impaired flexibility, Hypomobility, Improper body mechanics, Postural dysfunction, Decreased mobility, Decreased strength  Visit Diagnosis: Muscle weakness (generalized)  Muscle spasm of back  Low back pain, unspecified back pain laterality, unspecified chronicity, unspecified whether sciatica present     Problem List Patient Active Problem List   Diagnosis Date Noted    Spondylolisthesis of lumbosacral region 10/19/2020   Chronic tension-type headache, not intractable 06/23/2020   Hyperlipidemia    GERD (gastroesophageal reflux disease)    Excessive daytime sleepiness 01/12/2018   Seasonal and perennial allergic rhinitis 01/12/2018   CAD (coronary artery disease) 03/25/2014   Pain of left calf 03/25/2014   Morbid obesity ([place]) 03/09/2014  Midsternal chest pain 03/09/2014   Unstable angina ([place]) 03/08/2014   Hypothyroidism 03/08/2014   Chest pain    OSA (obstructive sleep apnea) 07/27/2012    Juel Burrow, PT, DPT 03/02/2021, 10:24 AM  [facility] @ [place], [place], [address] Phone: [phone]   Fax:  [phone]  Name: Jerry Keller MRN: [id number] Date of Birth: 1960/12/10

## 2021-03-05 ENCOUNTER — Other Ambulatory Visit: Payer: Self-pay | Admitting: Neurosurgery

## 2021-03-05 ENCOUNTER — Encounter: Payer: BC Managed Care – PPO | Admitting: Rehabilitative and Restorative Service Providers"

## 2021-03-05 DIAGNOSIS — R159 Full incontinence of feces: Secondary | ICD-10-CM

## 2021-03-12 ENCOUNTER — Other Ambulatory Visit: Payer: Self-pay

## 2021-03-12 ENCOUNTER — Encounter: Payer: Self-pay | Admitting: Rehabilitative and Restorative Service Providers"

## 2021-03-12 ENCOUNTER — Ambulatory Visit: Payer: BC Managed Care – PPO | Admitting: Rehabilitative and Restorative Service Providers"

## 2021-03-12 DIAGNOSIS — M62830 Muscle spasm of back: Secondary | ICD-10-CM

## 2021-03-12 DIAGNOSIS — M545 Low back pain, unspecified: Secondary | ICD-10-CM | POA: Diagnosis not present

## 2021-03-12 DIAGNOSIS — M6281 Muscle weakness (generalized): Secondary | ICD-10-CM

## 2021-03-12 NOTE — Therapy (Signed)
[facility] @ [place], [place], [address] Phone: [phone]   Fax:  [phone]  Physical Therapy Treatment  Patient Details  Name: Jerry Keller MRN: [id number] Date of Birth: [birth year] Referring Provider (PT): Bradly Bienenstock, NP   Encounter Date: 03/12/2021   PT End of Session - 03/12/21 1103     Visit Number 7    Date for PT Re-Evaluation 03/26/21    Authorization Type BCBS    Authorization Time Period 02/12/21 to 03/26/21    PT Start Time 1052    PT Stop Time 1130    PT Time Calculation (min) 38 min    Activity Tolerance Patient tolerated treatment well    Behavior During Therapy [facility] for tasks assessed/performed             Past Medical History:  Diagnosis Date   Anxiety    GERD (gastroesophageal reflux disease)    Headache, cluster    Hyperlipidemia    a. Mild - statin started 02/2014.   Hypothyroidism    Non-obstructive CAD    a. 02/2014 Cath: LM nl, LAD 12md, LCX nl, RCA 20p/m, EF 55-60%-->Med Rx.   Palpitations    none recently   Sleep apnea    uses cpap    Past Surgical History:  Procedure Laterality Date   COLONOSCOPY N/A 07/30/2013   Procedure: COLONOSCOPY;  Surgeon: PBeryle Beams MD;  [facility];  Service: Endoscopy;  Laterality: N/A;   LEFT HEART CATH AND CORONARY ANGIOGRAPHY N/A 06/23/2019   Procedure: LEFT HEART CATH AND CORONARY ANGIOGRAPHY;  Surgeon: CSherren Mocha MD;  [facility];  Service: Cardiovascular;  Laterality: N/A;   LEFT HEART CATHETERIZATION WITH CORONARY ANGIOGRAM N/A 03/08/2014   Procedure: LEFT HEART CATHETERIZATION WITH CORONARY ANGIOGRAM;  Surgeon: CBurnell Blanks MD;  [facility];  Service: Cardiovascular;  Laterality: N/A;   TONSILLECTOMY AND ADENOIDECTOMY     as a child    There were no vitals filed for this visit.   Subjective Assessment - 03/12/21 1055     Subjective Pt reports that he is feeling  like he is feeling better since his last session and working on pelvic floor muscles.  He states that he was able to follow up with his MD and he goes for new MRIs on 03/26/21/    Currently in Pain? Yes    Pain Score 5     Pain Location Back    Pain Orientation Lower    Pain Descriptors / Indicators Sore                               [facility] Adult PT Treatment/Exercise - 03/12/21 0001       Transfers   Five time sit to stand comments  21.9 sec UE on thighs      Lumbar Exercises: Stretches   Pelvic Tilt 10 reps    Piriformis Stretch Right;Left;2 reps;20 seconds    Piriformis Stretch Limitations in supine      Lumbar Exercises: Aerobic   Nustep L5 x6 min with PT present to discuss progress.      Lumbar Exercises: Seated   Other Seated Lumbar Exercises Pelvic Floor Contractions 10x7 sec hold with cuing for breathing      Lumbar Exercises: Supine   Ab Set 10 reps;2 seconds    AB Set Limitations holding beach ball    Clam 20  reps    Clam Limitations red tband    Bent Knee Raise 10 reps    Bent Knee Raise Limitations holding PPT marching    Bridge Compliant;10 reps;2 seconds                       PT Short Term Goals - 02/19/21 1059       PT SHORT TERM GOAL #1   Title Pt will be independent with his initial HEP to improve flexibility and understanding of proper body mechanics.    Status Achieved               PT Long Term Goals - 03/12/21 1150       PT LONG TERM GOAL #1   Title Pt will have atleast 4/5 MMT of BLE which will improve his efficiency with walking and other ADLs.    Status On-going      PT LONG TERM GOAL #2   Title Pt will report atleast 60% improvement in his back pain and function from the start of PT which will improve his ability to participate in work related tasks.    Status Partially Met      PT LONG TERM GOAL #3   Title Pt will be able to complete 5x sit to stand in less than 14 sec without UE support to  reflect improvements in his trunk/LE strength.    Status On-going      PT LONG TERM GOAL #4   Title Pt will be able to complete bed mobility such as rolling and supine to/from sit without the need for UE support or increase in low back pain.    Status Partially Met                   Plan - 03/12/21 1130     Clinical Impression Statement Pt tolerated session well with primary focus on pelvic and core stability. Pt is progressing with able to maintain pelvic and core contractions longer than last session. He requires some cuing with pelvic floor contraction to relax when he starts to feel gluts kicking in more to assist    PT Treatment/Interventions ADLs/Self Care Home Management;Cryotherapy;Moist Heat;Neuromuscular re-education;Balance training;Therapeutic exercise;Therapeutic activities;Functional mobility training;Patient/family education;Gait training;Manual techniques;Dry needling;Passive range of motion;Taping    PT Next Visit Plan Progress flexibility as tolerated.  Add in core strengthening. Continue dry needling and manual therapy as needed.    PT Home Exercise Plan Access Code: [email or web address]    Consulted and Agree with Plan of Care Patient             Patient will benefit from skilled therapeutic intervention in order to improve the following deficits and impairments:  Abnormal gait, Decreased range of motion, Increased muscle spasms, Decreased activity tolerance, Pain, Impaired flexibility, Hypomobility, Improper body mechanics, Postural dysfunction, Decreased mobility, Decreased strength  Visit Diagnosis: Muscle weakness (generalized)  Muscle spasm of back  Low back pain, unspecified back pain laterality, unspecified chronicity, unspecified whether sciatica present     Problem List Patient Active Problem List   Diagnosis Date Noted   Spondylolisthesis of lumbosacral region 10/19/2020   Chronic tension-type headache, not intractable 06/23/2020   Hyperlipidemia     GERD (gastroesophageal reflux disease)    Excessive daytime sleepiness 01/12/2018   Seasonal and perennial allergic rhinitis 01/12/2018   CAD (coronary artery disease) 03/25/2014   Pain of left calf 03/25/2014   Morbid obesity ([place]) 03/09/2014   Midsternal chest pain 03/09/2014  Unstable angina ([place]) 03/08/2014   Hypothyroidism 03/08/2014   Chest pain    OSA (obstructive sleep apnea) 07/27/2012    Juel Burrow, PT, DPT 03/12/2021, 11:51 AM  [facility] [place] @ [place], [place], [address] Phone: [phone]   Fax:  [phone]  Name: Jahfari Ambers MRN: [id number] Date of Birth: [birth year]

## 2021-03-16 ENCOUNTER — Ambulatory Visit: Payer: BC Managed Care – PPO | Attending: Student

## 2021-03-16 ENCOUNTER — Other Ambulatory Visit: Payer: Self-pay

## 2021-03-16 DIAGNOSIS — M6281 Muscle weakness (generalized): Secondary | ICD-10-CM | POA: Insufficient documentation

## 2021-03-16 DIAGNOSIS — M545 Low back pain, unspecified: Secondary | ICD-10-CM | POA: Diagnosis present

## 2021-03-16 DIAGNOSIS — M62830 Muscle spasm of back: Secondary | ICD-10-CM | POA: Insufficient documentation

## 2021-03-16 NOTE — Patient Instructions (Signed)
Continue current HEP 

## 2021-03-16 NOTE — Therapy (Signed)
[facility] @ [facility] [place], [place], [address] Phone: [phone]   Fax:  [phone]  Physical Therapy Treatment  Patient Details  Name: Jerry Keller MRN: [id number] Date of Birth: 17-Nov-1960 Referring Provider (PT): [place], NP   Encounter Date: 03/16/2021   PT End of Session - 03/16/21 1165     Visit Number 8    Date for PT Re-Evaluation 03/26/21    Authorization Type BCBS    Authorization Time Period 02/12/21 to 03/26/21    PT Start Time 0938    PT Stop Time 1020    PT Time Calculation (min) 42 min    Activity Tolerance Patient tolerated treatment well    Behavior During Therapy [facility] for tasks assessed/performed             Past Medical History:  Diagnosis Date   Anxiety    GERD (gastroesophageal reflux disease)    Headache, cluster    Hyperlipidemia    a. Mild - statin started 02/2014.   Hypothyroidism    Non-obstructive CAD    a. 02/2014 Cath: LM nl, LAD 1md, LCX nl, RCA 20p/m, EF 55-60%-->Med Rx.   Palpitations    none recently   Sleep apnea    uses cpap    Past Surgical History:  Procedure Laterality Date   COLONOSCOPY N/A 07/30/2013   Procedure: COLONOSCOPY;  Surgeon: PBeryle Beams MD;  Location: [facility];  Service: Endoscopy;  Laterality: N/A;   LEFT HEART CATH AND CORONARY ANGIOGRAPHY N/A 06/23/2019   Procedure: LEFT HEART CATH AND CORONARY ANGIOGRAPHY;  Surgeon: CSherren Mocha MD;  [facility];  Service: Cardiovascular;  Laterality: N/A;   LEFT HEART CATHETERIZATION WITH CORONARY ANGIOGRAM N/A 03/08/2014   Procedure: LEFT HEART CATHETERIZATION WITH CORONARY ANGIOGRAM;  Surgeon: CBurnell Blanks MD;  [facility];  Service: Cardiovascular;  Laterality: N/A;   TONSILLECTOMY AND ADENOIDECTOMY     as a child    There were no vitals filed for this visit.   Subjective Assessment - 03/16/21 0943     Subjective Patient states he's had  several bad days.  He states he's had a lot going on at work as well.  He rates his pain at 7/10 at worst when it "grabs" and 5/10 consistently.  He reports the pelvic floor exercises are helping.    Pertinent History anxiety, GERD, L heart cath    Limitations Lifting;House hold activities    Patient Stated Goals improve sleep, improve pain to allow him to return to work    Currently in Pain? Yes    Pain Score 7     Pain Location Back    Pain Orientation Left    Pain Descriptors / Indicators Aching;Nagging    Pain Type Chronic pain    Pain Onset More than a month ago    Pain Frequency Intermittent                               [facility] Adult PT Treatment/Exercise - 03/16/21 0001       Lumbar Exercises: Stretches   Pelvic Tilt 10 reps    Piriformis Stretch Right;Left;2 reps;20 seconds    Piriformis Stretch Limitations in supine      Lumbar Exercises: Aerobic   Nustep L5 x6 min with PT present to discuss progress.      Lumbar Exercises: Seated   Other Seated Lumbar Exercises  Pelvic Floor Contractions 10x7 sec hold with cuing for breathing      Lumbar Exercises: Supine   Ab Set 10 reps;2 seconds    AB Set Limitations holding beach ball      Knee/Hip Exercises: Stretches   Active Hamstring Stretch Both;3 reps;30 seconds      Manual Therapy   Manual Therapy Soft tissue mobilization    Manual therapy comments Addaday x 10 min to bilateral glutes and lower lumbar                     PT Education - 03/16/21 0950     Education Details Educated patient on need to control stress at work and avoid prolonged static standing or prolonged sitting.    Person(s) Educated Patient    Methods Explanation    Comprehension Verbalized understanding              PT Short Term Goals - 02/19/21 1059       PT SHORT TERM GOAL #1   Title Pt will be independent with his initial HEP to improve flexibility and understanding of proper body mechanics.    Status  Achieved               PT Long Term Goals - 03/12/21 1150       PT LONG TERM GOAL #1   Title Pt will have atleast 4/5 MMT of BLE which will improve his efficiency with walking and other ADLs.    Status On-going      PT LONG TERM GOAL #2   Title Pt will report atleast 60% improvement in his back pain and function from the start of PT which will improve his ability to participate in work related tasks.    Status Partially Met      PT LONG TERM GOAL #3   Title Pt will be able to complete 5x sit to stand in less than 14 sec without UE support to reflect improvements in his trunk/LE strength.    Status On-going      PT LONG TERM GOAL #4   Title Pt will be able to complete bed mobility such as rolling and supine to/from sit without the need for UE support or increase in low back pain.    Status Partially Met                   Plan - 03/16/21 0953     Clinical Impression Statement Patient desired to do DN today but therapist not certified.  He understands that he will need to schedule with DN PT if he wants to do DN each visit.  He was able to complete all tasks today but complained of moderate pain throughout.  We had to end exercises and do theragun and manual techniques for end of session.  Patient is very pain focused and tends to lean on modalities heavily vs. his HEP.  He would benefit from consistent fitness program and flexibility along with the pain control methrods.    Personal Factors and Comorbidities Age;Comorbidity 1;Time since onset of injury/illness/exacerbation    Comorbidities surgery 10/19/20    Examination-Activity Limitations Stand;Lift;Bed Mobility;Transfers    Examination-Participation Restrictions Occupation;Yard Work    [employer] Evolving/Moderate complexity    Clinical Decision Making Moderate    Rehab Potential Good    PT Frequency 2x / week    PT Duration 6 weeks    PT Treatment/Interventions ADLs/Self Care Home  Management;Cryotherapy;Moist Heat;Neuromuscular re-education;Balance training;Therapeutic  exercise;Therapeutic activities;Functional mobility training;Patient/family education;Gait training;Manual techniques;Dry needling;Passive range of motion;Taping    PT Next Visit Plan Progress flexibility as tolerated.  Add in core strengthening. Continue dry needling and manual therapy as needed.    PT Home Exercise Plan Access Code: [email or web address]    Consulted and Agree with Plan of Care Patient             Patient will benefit from skilled therapeutic intervention in order to improve the following deficits and impairments:  Abnormal gait, Decreased range of motion, Increased muscle spasms, Decreased activity tolerance, Pain, Impaired flexibility, Hypomobility, Improper body mechanics, Postural dysfunction, Decreased mobility, Decreased strength  Visit Diagnosis: Muscle weakness (generalized)  Muscle spasm of back  Low back pain, unspecified back pain laterality, unspecified chronicity, unspecified whether sciatica present     Problem List Patient Active Problem List   Diagnosis Date Noted   Spondylolisthesis of lumbosacral region 10/19/2020   Chronic tension-type headache, not intractable 06/23/2020   Hyperlipidemia    GERD (gastroesophageal reflux disease)    Excessive daytime sleepiness 01/12/2018   Seasonal and perennial allergic rhinitis 01/12/2018   CAD (coronary artery disease) 03/25/2014   Pain of left calf 03/25/2014   Morbid obesity ([place]) 03/09/2014   Midsternal chest pain 03/09/2014   Unstable angina ([place]) 03/08/2014   Hypothyroidism 03/08/2014   Chest pain    OSA (obstructive sleep apnea) 07/27/2012    Anderson Malta B. York Valliant, PT 03/16/2209:54 AM   [facility] @ [place], [place], [address] Phone: [phone]   Fax:  [phone]  Name: Karl Erway MRN: [id number] Date of Birth: 1961/03/20

## 2021-03-19 ENCOUNTER — Encounter: Payer: BC Managed Care – PPO | Admitting: Rehabilitative and Restorative Service Providers"

## 2021-03-21 ENCOUNTER — Ambulatory Visit: Payer: BC Managed Care – PPO | Admitting: Rehabilitative and Restorative Service Providers"

## 2021-03-21 ENCOUNTER — Other Ambulatory Visit: Payer: Self-pay

## 2021-03-21 ENCOUNTER — Encounter: Payer: Self-pay | Admitting: Rehabilitative and Restorative Service Providers"

## 2021-03-21 DIAGNOSIS — M545 Low back pain, unspecified: Secondary | ICD-10-CM

## 2021-03-21 DIAGNOSIS — M6281 Muscle weakness (generalized): Secondary | ICD-10-CM

## 2021-03-21 DIAGNOSIS — M62830 Muscle spasm of back: Secondary | ICD-10-CM

## 2021-03-21 NOTE — Therapy (Signed)
[facility] @ [place], [place], [address] Phone: [phone]   Fax:  [phone]  Physical Therapy Treatment  Patient Details  Name: Jerry Keller MRN: [id number] Date of Birth: 10/24/1960 Referring Provider (PT): Bradly Bienenstock, NP   Encounter Date: 03/21/2021   PT End of Session - 03/21/21 1212     Visit Number 9    Date for PT Re-Evaluation 03/26/21    Authorization Type BCBS    Authorization Time Period 02/12/21 to 03/26/21    PT Start Time 1145    PT Stop Time 1225    PT Time Calculation (min) 40 min    Activity Tolerance Patient tolerated treatment well    Behavior During Therapy [facility] for tasks assessed/performed             Past Medical History:  Diagnosis Date   Anxiety    GERD (gastroesophageal reflux disease)    Headache, cluster    Hyperlipidemia    a. Mild - statin started 02/2014.   Hypothyroidism    Non-obstructive CAD    a. 02/2014 Cath: LM nl, LAD 52md, LCX nl, RCA 20p/m, EF 55-60%-->Med Rx.   Palpitations    none recently   Sleep apnea    uses cpap    Past Surgical History:  Procedure Laterality Date   COLONOSCOPY N/A 07/30/2013   Procedure: COLONOSCOPY;  Surgeon: PBeryle Beams MD;  [facility];  Service: Endoscopy;  Laterality: N/A;   LEFT HEART CATH AND CORONARY ANGIOGRAPHY N/A 06/23/2019   Procedure: LEFT HEART CATH AND CORONARY ANGIOGRAPHY;  Surgeon: CSherren Mocha MD;  [facility];  Service: Cardiovascular;  Laterality: N/A;   LEFT HEART CATHETERIZATION WITH CORONARY ANGIOGRAM N/A 03/08/2014   Procedure: LEFT HEART CATHETERIZATION WITH CORONARY ANGIOGRAM;  Surgeon: CBurnell Blanks MD;  [facility];  Service: Cardiovascular;  Laterality: N/A;   TONSILLECTOMY AND ADENOIDECTOMY     as a child    There were no vitals filed for this visit.   Subjective Assessment - 03/21/21 1211     Subjective Pt asking for DN stating that  he is having increased R sided back pain    Pertinent History anxiety, GERD, L heart cath    Patient Stated Goals improve sleep, improve pain to allow him to return to work    Currently in Pain? Yes    Pain Score 7     Pain Location Back    Pain Orientation Right    Pain Descriptors / Indicators Sharp;Spasm    Pain Type Chronic pain                               [facility] Adult PT Treatment/Exercise - 03/21/21 0001       Lumbar Exercises: Stretches   Pelvic Tilt 10 reps    Piriformis Stretch Right;Left;2 reps;20 seconds    Piriformis Stretch Limitations in sitting    Other Lumbar Stretch Exercise seated physioball 3 way roll out x5 each    Other Lumbar Stretch Exercise --      Lumbar Exercises: Aerobic   Nustep L5 x6 min with PT present to discuss progress.      Lumbar Exercises: Seated   Other Seated Lumbar Exercises Pelvic Floor Contractions 10x7 sec hold with cuing for breathing      Manual Therapy   Manual Therapy Soft tissue mobilization;Myofascial release    Soft tissue  mobilization to lumbar paraspinals and R piriformis    Myofascial Release trigger point release to B lumbar paraspinals              Trigger Point Dry Needling - 03/21/21 0001     Consent Given? Yes    Education Handout Provided Previously provided    Muscles Treated Back/Hip Lumbar multifidi    Lumbar multifidi Response Twitch response elicited;Palpable increased muscle length   B side                    PT Short Term Goals - 02/19/21 1059       PT SHORT TERM GOAL #1   Title Pt will be independent with his initial HEP to improve flexibility and understanding of proper body mechanics.    Status Achieved               PT Long Term Goals - 03/21/21 1228       PT LONG TERM GOAL #4   Title Pt will be able to complete bed mobility such as rolling and supine to/from sit without the need for UE support or increase in low back pain.    Status Partially Met    pt continues only partial reliance on UE with supine to/from sit.                  Plan - 03/21/21 1225     Clinical Impression Statement Pt had a great repsonse to Dry Needling to his lumbar region.  Performed on B side with increased tension and spasm noted on R lumbar paraspinals. Pt with muscle twitch noted with each Dry Needling location and reported immediate decrease of pain to 3/10 following. Pt reports only one episode of incontenence on Sunday, but otherwise he has not had any. Pt continues to require skilled PT to progress towards goal related activities.    Personal Factors and Comorbidities Age;Comorbidity 1;Time since onset of injury/illness/exacerbation    PT Treatment/Interventions ADLs/Self Care Home Management;Cryotherapy;Moist Heat;Neuromuscular re-education;Balance training;Therapeutic exercise;Therapeutic activities;Functional mobility training;Patient/family education;Gait training;Manual techniques;Dry needling;Passive range of motion;Taping    PT Next Visit Plan Progress flexibility as tolerated.  Add in core strengthening. Continue dry needling and manual therapy as needed.    Consulted and Agree with Plan of Care Patient             Patient will benefit from skilled therapeutic intervention in order to improve the following deficits and impairments:  Abnormal gait, Decreased range of motion, Increased muscle spasms, Decreased activity tolerance, Pain, Impaired flexibility, Hypomobility, Improper body mechanics, Postural dysfunction, Decreased mobility, Decreased strength  Visit Diagnosis: Muscle weakness (generalized)  Muscle spasm of back  Low back pain, unspecified back pain laterality, unspecified chronicity, unspecified whether sciatica present     Problem List Patient Active Problem List   Diagnosis Date Noted   Spondylolisthesis of lumbosacral region 10/19/2020   Chronic tension-type headache, not intractable 06/23/2020   Hyperlipidemia     GERD (gastroesophageal reflux disease)    Excessive daytime sleepiness 01/12/2018   Seasonal and perennial allergic rhinitis 01/12/2018   CAD (coronary artery disease) 03/25/2014   Pain of left calf 03/25/2014   Morbid obesity ([place]) 03/09/2014   Midsternal chest pain 03/09/2014   Unstable angina ([place]) 03/08/2014   Hypothyroidism 03/08/2014   Chest pain    OSA (obstructive sleep apnea) 07/27/2012    Juel Burrow, PT, DPT 03/21/2021, 12:30 PM  [facility] @ [facility] [id number]  [place], [place], [address] Phone: [phone]   Fax:  [phone]  Name: Jerry Keller MRN: [id number] Date of Birth: 1961/02/28

## 2021-03-23 ENCOUNTER — Ambulatory Visit: Payer: BC Managed Care – PPO

## 2021-03-23 ENCOUNTER — Other Ambulatory Visit: Payer: Self-pay

## 2021-03-23 DIAGNOSIS — M6281 Muscle weakness (generalized): Secondary | ICD-10-CM | POA: Diagnosis not present

## 2021-03-23 DIAGNOSIS — M545 Low back pain, unspecified: Secondary | ICD-10-CM

## 2021-03-23 DIAGNOSIS — M62830 Muscle spasm of back: Secondary | ICD-10-CM

## 2021-03-23 NOTE — Patient Instructions (Signed)
Focus heavily on hamstring and quad/hip flexor stretch between now and next visit.  Eliminate stretching in tub and do this on bed.

## 2021-03-23 NOTE — Therapy (Signed)
[facility] @ [facility] [address] [place], [place], [address] Phone: [phone]   Fax:  [phone]  Physical Therapy Treatment  Patient Details  Name: Jerry Keller MRN: [id number] Date of Birth: 10/05/1960 Referring Provider (PT): Meghan Summer, NP   Encounter Date: 03/23/2021   PT End of Session - 03/23/21 1101     Visit Number 10    Date for PT Re-Evaluation 03/26/21    Authorization Type BCBS    Authorization Time Period 02/12/21 to 03/26/21    PT Start Time 0930    PT Stop Time 1018    PT Time Calculation (min) 48 min    Activity Tolerance Patient tolerated treatment well    Behavior During Therapy [facility] for tasks assessed/performed             Past Medical History:  Diagnosis Date   Anxiety    GERD (gastroesophageal reflux disease)    Headache, cluster    Hyperlipidemia    a. Mild - statin started 02/2014.   Hypothyroidism    Non-obstructive CAD    a. 02/2014 Cath: LM nl, LAD 20m/d, LCX nl, RCA 20p/m, EF 55-60%-->Med Rx.   Palpitations    none recently   Sleep apnea    uses cpap    Past Surgical History:  Procedure Laterality Date   COLONOSCOPY N/A 07/30/2013   Procedure: COLONOSCOPY;  Surgeon: Patrick D Hung, MD;  [facility]: [facility];  Service: Endoscopy;  Laterality: N/A;   LEFT HEART CATH AND CORONARY ANGIOGRAPHY N/A 06/23/2019   Procedure: LEFT HEART CATH AND CORONARY ANGIOGRAPHY;  Surgeon: Cooper, Michael, MD;  [facility];  Service: Cardiovascular;  Laterality: N/A;   LEFT HEART CATHETERIZATION WITH CORONARY ANGIOGRAM N/A 03/08/2014   Procedure: LEFT HEART CATHETERIZATION WITH CORONARY ANGIOGRAM;  Surgeon: Christopher D McAlhany, MD;  [facility];  Service: Cardiovascular;  Laterality: N/A;   TONSILLECTOMY AND ADENOIDECTOMY     as a child    There were no vitals filed for this visit.   Subjective Assessment - 03/23/21 1005     Subjective Patient states "oh I am SO  sore and walking like a question mark".  I've been in the bathtub doing my stretches.  I also had another incontinence episode.    Pertinent History anxiety, GERD, L heart cath    Limitations Lifting;House hold activities    Patient Stated Goals improve sleep, improve pain to allow him to return to work    Currently in Pain? Yes    Pain Score 7     Pain Location Back    Pain Onset More than a month ago                [facility] PT Assessment - 03/23/21 0001       Assessment   Medical Diagnosis s/p arthrodesis L5/S1    Referring Provider (PT) Meghan Summer, NP    Onset Date/Surgical Date 10/19/20      Precautions   Precautions None      PROM   Overall PROM Comments Bilateral hip IR- 5 deg      Strength   Overall Strength Comments hip abduction: 4/5 MMT bilateral      Flexibility   Soft Tissue Assessment /Muscle Length yes    Hamstrings to 50 degrees in supine bilaterally      Transfers   Five time sit to stand comments  21.9 sec UE on thighs                             [facility] Adult PT Treatment/Exercise - 03/23/21 0001       Lumbar Exercises: Aerobic   Nustep L5 x6 min with PT present to discuss progress.      Knee/Hip Exercises: Stretches   Active Hamstring Stretch Both;3 reps;30 seconds    Quad Stretch Both;3 reps;30 seconds    Quad Stretch Limitations quad/hip flexor standing with foot on table holding onto chair    Piriformis Stretch Both;3 reps;30 seconds                     PT Education - 03/23/21 1100     Education Details Heavy education on how his flexibility issues are affectiing his recovery.  He continues to focus on treating his symptoms and steers away from his HEP at times.    Person(s) Educated Patient    Methods Explanation;Demonstration;Verbal cues    Comprehension Verbalized understanding;Returned demonstration;Verbal cues required              PT Short Term Goals - 02/19/21 1059       PT SHORT TERM GOAL #1    Title Pt will be independent with his initial HEP to improve flexibility and understanding of proper body mechanics.    Status Achieved               PT Long Term Goals - 03/23/21 1113       PT LONG TERM GOAL #1   Title Pt will have atleast 4/5 MMT of BLE which will improve his efficiency with walking and other ADLs.    Time 6    Period Weeks    Status On-going      PT LONG TERM GOAL #2   Title Pt will report atleast 60% improvement in his back pain and function from the start of PT which will improve his ability to participate in work related tasks.    Time 6    Period Weeks    Status Partially Met      PT LONG TERM GOAL #3   Title Pt will be able to complete 5x sit to stand in less than 14 sec without UE support to reflect improvements in his trunk/LE strength.    Time 6    Period Weeks    Status On-going      PT LONG TERM GOAL #4   Title Pt will be able to complete bed mobility such as rolling and supine to/from sit without the need for UE support or increase in low back pain.    Time 6    Period Weeks    Status Partially Met      PT LONG TERM GOAL #5   Title Pt will be able to complete 10 straight single leg raises on each LE without abdominal coning to reflect improvements in abdominal strength and endurance.    Time 6    Period Weeks    Status Partially Met                   Plan - 03/23/21 1106     Clinical Impression Statement Patient is slow to progress.  He continues to focus on treating his symptoms vs. focus on his HEP.  He would benefit from consistency with his HEP along with treating his symptoms. He will benefit from re-authorization for skilled PT to address flexibility and core strength.    Personal Factors and Comorbidities Age;Comorbidity 1;Time since onset of injury/illness/exacerbation    Comorbidities surgery 10/19/20    Examination-Activity  Limitations Stand;Lift;Bed Mobility;Transfers    Examination-Participation Restrictions  Occupation;Yard Work    Stability/Clinical Decision Making Evolving/Moderate complexity    Clinical Decision Making Moderate    Rehab Potential Good    PT Frequency 2x / week    PT Duration 6 weeks    PT Treatment/Interventions ADLs/Self Care Home Management;Cryotherapy;Moist Heat;Neuromuscular re-education;Balance training;Therapeutic exercise;Therapeutic activities;Functional mobility training;Patient/family education;Gait training;Manual techniques;Dry needling;Passive range of motion;Taping    PT Next Visit Plan Progress flexibility as tolerated.  Add in core strengthening. Continue dry needling and manual therapy as needed.    PT Home Exercise Plan Access Code: [email or web address]    Consulted and Agree with Plan of Care Patient             Patient will benefit from skilled therapeutic intervention in order to improve the following deficits and impairments:  Abnormal gait, Decreased range of motion, Increased muscle spasms, Decreased activity tolerance, Pain, Impaired flexibility, Hypomobility, Improper body mechanics, Postural dysfunction, Decreased mobility, Decreased strength  Visit Diagnosis: Muscle weakness (generalized)  Muscle spasm of back  Low back pain, unspecified back pain laterality, unspecified chronicity, unspecified whether sciatica present     Problem List Patient Active Problem List   Diagnosis Date Noted   Spondylolisthesis of lumbosacral region 10/19/2020   Chronic tension-type headache, not intractable 06/23/2020   Hyperlipidemia    GERD (gastroesophageal reflux disease)    Excessive daytime sleepiness 01/12/2018   Seasonal and perennial allergic rhinitis 01/12/2018   CAD (coronary artery disease) 03/25/2014   Pain of left calf 03/25/2014   Morbid obesity ([facility]) 03/09/2014   Midsternal chest pain 03/09/2014   Unstable angina ([facility]) 03/08/2014   Hypothyroidism 03/08/2014   Chest pain    OSA (obstructive sleep apnea) 07/27/2012    Jennifer B. Fields,  PT 03/23/2210:15 AM   [facility] @ [facility] [address] [place], [place], [address] Phone: [phone]   Fax:  [phone]  Name: Jerry Keller MRN: [id number] Date of Birth: 02/19/1961    

## 2021-03-26 ENCOUNTER — Ambulatory Visit: Payer: BC Managed Care – PPO | Admitting: Rehabilitative and Restorative Service Providers"

## 2021-03-26 ENCOUNTER — Ambulatory Visit
Admission: RE | Admit: 2021-03-26 | Discharge: 2021-03-26 | Disposition: A | Discharge status: Home / Self Care | Payer: BC Managed Care – PPO | Source: Ambulatory Visit | Attending: Neurosurgery | Admitting: Neurosurgery

## 2021-03-26 DIAGNOSIS — R159 Full incontinence of feces: Secondary | ICD-10-CM

## 2021-03-30 ENCOUNTER — Other Ambulatory Visit: Payer: Self-pay

## 2021-03-30 ENCOUNTER — Ambulatory Visit: Payer: BC Managed Care – PPO | Admitting: Physical Therapy

## 2021-03-30 DIAGNOSIS — M6281 Muscle weakness (generalized): Secondary | ICD-10-CM

## 2021-03-30 DIAGNOSIS — M545 Low back pain, unspecified: Secondary | ICD-10-CM

## 2021-03-30 DIAGNOSIS — M62830 Muscle spasm of back: Secondary | ICD-10-CM

## 2021-03-30 NOTE — Addendum Note (Signed)
Addended by: Dellia Nims on: 03/30/2021 08:03 AM   Modules accepted: Orders

## 2021-03-30 NOTE — Therapy (Signed)
[facility] @ [address] [place], [place], [address] Phone: [phone]   Fax:  [phone]  Physical Therapy Treatment  Patient Details  Name: Jerry Keller MRN: [id number] Date of Birth: 1961-03-10 Referring Provider (PT): Bradly Bienenstock, NP   Encounter Date: 03/30/2021   PT End of Session - 03/30/21 1056     Visit Number 11    Date for PT Re-Evaluation 04/20/21    Authorization Type BCBS    Authorization Time Period 03/27/2021 to 04/20/21    PT Start Time 1015    PT Stop Time 1100    PT Time Calculation (min) 45 min    Activity Tolerance Patient tolerated treatment well             Past Medical History:  Diagnosis Date   Anxiety    GERD (gastroesophageal reflux disease)    Headache, cluster    Hyperlipidemia    a. Mild - statin started 02/2014.   Hypothyroidism    Non-obstructive CAD    a. 02/2014 Cath: LM nl, LAD 12md, LCX nl, RCA 20p/m, EF 55-60%-->Med Rx.   Palpitations    none recently   Sleep apnea    uses cpap    Past Surgical History:  Procedure Laterality Date   COLONOSCOPY N/A 07/30/2013   Procedure: COLONOSCOPY;  Surgeon: PBeryle Beams MD;  Location: [facility];  Service: Endoscopy;  Laterality: N/A;   LEFT HEART CATH AND CORONARY ANGIOGRAPHY N/A 06/23/2019   Procedure: LEFT HEART CATH AND CORONARY ANGIOGRAPHY;  Surgeon: CSherren Mocha MD;  [facility];  Service: Cardiovascular;  Laterality: N/A;   LEFT HEART CATHETERIZATION WITH CORONARY ANGIOGRAM N/A 03/08/2014   Procedure: LEFT HEART CATHETERIZATION WITH CORONARY ANGIOGRAM;  Surgeon: CBurnell Blanks MD;  [facility];  Service: Cardiovascular;  Laterality: N/A;   TONSILLECTOMY AND ADENOIDECTOMY     as a child    There were no vitals filed for this visit.   Subjective Assessment - 03/30/21 1018     Subjective Always with pain in the low back.  Went to pain management this week.  MRI shows nerve  pinching on the right side.  Reports progress being made with only 1 incontinence episode this week.  I also have been dealing with neck and headache symptoms.    Pertinent History anxiety, GERD, L heart cath    Patient Stated Goals improve sleep, improve pain to allow him to return to work    Currently in Pain? Yes    Pain Score 6     Pain Location Back    Pain Orientation Right    Pain Radiating Towards left LE                               [facility] Adult PT Treatment/Exercise - 03/30/21 0001       Self-Care   Self-Care Heat/Ice Application    Heat/Ice Application recommend heat on/off for 24 hours as needed for post needling soreness      Lumbar Exercises: Aerobic   Nustep L5 x6 min with PT present to discuss progress.      Lumbar Exercises: Seated   Other Seated Lumbar Exercises discussion of ex's and recommendation for frequent performance today to compliment treatment      Electrical Stimulation   Electrical Stimulation Location right gluteals    Electrical Stimulation Action pre-mod    Electrical Stimulation Parameters with  DN 8 min    Electrical Stimulation Goals Pain      Manual Therapy   Soft tissue mobilization bil lumbar paraspinals and right gluteals              Trigger Point Dry Needling - 03/30/21 0001     Consent Given? Yes    Muscles Treated Back/Hip Gluteus minimus;Gluteus medius;Gluteus maximus;Piriformis    Electrical Stimulation Performed with Dry Needling Yes    E-stim with Dry Needling Details right L5-S1 paraspinal and superior gluteal region    Gluteus Minimus Response Palpable increased muscle length    Gluteus Medius Response Palpable increased muscle length    Gluteus Maximus Response Palpable increased muscle length    Piriformis Response Palpable increased muscle length    Lumbar multifidi Response Palpable increased muscle length;Twitch response elicited                     PT Short Term Goals - 02/19/21  1059       PT SHORT TERM GOAL #1   Title Pt will be independent with his initial HEP to improve flexibility and understanding of proper body mechanics.    Status Achieved               PT Long Term Goals - 03/23/21 1113       PT LONG TERM GOAL #1   Title Pt will have atleast 4/5 MMT of BLE which will improve his efficiency with walking and other ADLs.    Time 6    Period Weeks    Status On-going      PT LONG TERM GOAL #2   Title Pt will report atleast 60% improvement in his back pain and function from the start of PT which will improve his ability to participate in work related tasks.    Time 6    Period Weeks    Status Partially Met      PT LONG TERM GOAL #3   Title Pt will be able to complete 5x sit to stand in less than 14 sec without UE support to reflect improvements in his trunk/LE strength.    Time 6    Period Weeks    Status On-going      PT LONG TERM GOAL #4   Title Pt will be able to complete bed mobility such as rolling and supine to/from sit without the need for UE support or increase in low back pain.    Time 6    Period Weeks    Status Partially Met      PT LONG TERM GOAL #5   Title Pt will be able to complete 10 straight single leg raises on each LE without abdominal coning to reflect improvements in abdominal strength and endurance.    Time 6    Period Weeks    Status Partially Met                   Plan - 03/30/21 1218     Clinical Impression Statement The patient is receptive to adding ES to DN in L5-S1 proximal dermatomal pattern in addition to targeted DN to gluteal and piriformis muscles for numerous tender points and taut bands in these muscles.  Following treatment he reports improved right distal LE symptoms.  Encouraged regular compliance of mobility ex's to optimize benefit.  Therapist closely monitoring response to all interventions.    Personal Factors and Comorbidities Age;Comorbidity 1;Time since onset of  injury/illness/exacerbation  Comorbidities surgery 10/19/20    Examination-Activity Limitations Stand;Lift;Bed Mobility;Transfers    Rehab Potential Good    PT Frequency 2x / week    PT Duration 6 weeks    PT Treatment/Interventions ADLs/Self Care Home Management;Cryotherapy;Moist Heat;Neuromuscular re-education;Balance training;Therapeutic exercise;Therapeutic activities;Functional mobility training;Patient/family education;Gait training;Manual techniques;Dry needling;Passive range of motion;Taping    PT Next Visit Plan assess response to DN/ES; add core strengthening    PT Home Exercise Plan Access Code: [email or web address]             Patient will benefit from skilled therapeutic intervention in order to improve the following deficits and impairments:  Abnormal gait, Decreased range of motion, Increased muscle spasms, Decreased activity tolerance, Pain, Impaired flexibility, Hypomobility, Improper body mechanics, Postural dysfunction, Decreased mobility, Decreased strength  Visit Diagnosis: Muscle weakness (generalized)  Muscle spasm of back  Low back pain, unspecified back pain laterality, unspecified chronicity, unspecified whether sciatica present     Problem List Patient Active Problem List   Diagnosis Date Noted   Spondylolisthesis of lumbosacral region 10/19/2020   Chronic tension-type headache, not intractable 06/23/2020   Hyperlipidemia    GERD (gastroesophageal reflux disease)    Excessive daytime sleepiness 01/12/2018   Seasonal and perennial allergic rhinitis 01/12/2018   CAD (coronary artery disease) 03/25/2014   Pain of left calf 03/25/2014   Morbid obesity ([place]) 03/09/2014   Midsternal chest pain 03/09/2014   Unstable angina ([place]) 03/08/2014   Hypothyroidism 03/08/2014   Chest pain    OSA (obstructive sleep apnea) 07/27/2012   Ruben Im, PT 03/30/21 12:28 PM Phone: [phone] Fax: [phone]  Alvera Singh, PT 03/30/2021, 12:27 PM  [facility] @ [address] [place], [place], [address] Phone: [phone]   Fax:  [phone]  Name: Jerry Keller MRN: [id number] Date of Birth: November 19, 1960

## 2021-04-02 ENCOUNTER — Encounter: Payer: BC Managed Care – PPO | Admitting: Physical Therapy

## 2021-04-05 ENCOUNTER — Other Ambulatory Visit: Payer: Self-pay

## 2021-04-05 ENCOUNTER — Ambulatory Visit: Payer: BC Managed Care – PPO | Admitting: Physical Therapy

## 2021-04-05 DIAGNOSIS — M545 Low back pain, unspecified: Secondary | ICD-10-CM

## 2021-04-05 DIAGNOSIS — M6281 Muscle weakness (generalized): Secondary | ICD-10-CM | POA: Diagnosis not present

## 2021-04-05 DIAGNOSIS — M62830 Muscle spasm of back: Secondary | ICD-10-CM

## 2021-04-05 NOTE — Therapy (Signed)
[facility] @ [address] [place], [place], [address] Phone: [phone]   Fax:  [phone]  Physical Therapy Treatment  Patient Details  Name: Jerry Keller MRN: [id number] Date of Birth: Apr 01, 1961 Referring Provider (PT): [place], NP   Encounter Date: 04/05/2021   PT End of Session - 04/05/21 0957     Visit Number 12    Date for PT Re-Evaluation 04/20/21    Authorization Type BCBS    Authorization Time Period 03/27/2021 to 04/20/21    PT Start Time 0917    PT Stop Time 1010   DN/heat   PT Time Calculation (min) 53 min    Activity Tolerance Patient tolerated treatment well             Past Medical History:  Diagnosis Date   Anxiety    GERD (gastroesophageal reflux disease)    Headache, cluster    Hyperlipidemia    a. Mild - statin started 02/2014.   Hypothyroidism    Non-obstructive CAD    a. 02/2014 Cath: LM nl, LAD 9md, LCX nl, RCA 20p/m, EF 55-60%-->Med Rx.   Palpitations    none recently   Sleep apnea    uses cpap    Past Surgical History:  Procedure Laterality Date   COLONOSCOPY N/A 07/30/2013   Procedure: COLONOSCOPY;  Surgeon: PBeryle Beams MD;  [facility];  Service: Endoscopy;  Laterality: N/A;   LEFT HEART CATH AND CORONARY ANGIOGRAPHY N/A 06/23/2019   Procedure: LEFT HEART CATH AND CORONARY ANGIOGRAPHY;  Surgeon: CSherren Mocha MD;  [facility];  Service: Cardiovascular;  Laterality: N/A;   LEFT HEART CATHETERIZATION WITH CORONARY ANGIOGRAM N/A 03/08/2014   Procedure: LEFT HEART CATHETERIZATION WITH CORONARY ANGIOGRAM;  Surgeon: CBurnell Blanks MD;  [facility];  Service: Cardiovascular;  Laterality: N/A;   TONSILLECTOMY AND ADENOIDECTOMY     as a child    There were no vitals filed for this visit.   Subjective Assessment - 04/05/21 0928     Subjective Since last time that big toe hasn't bothered me.  Now that left hip is bothering  me.  I had to dec my Gabapentin in half b/c it sent me to the bathroom/had an accident.    Pertinent History anxiety, GERD, L heart cath    Limitations Lifting;House hold activities    Patient Stated Goals improve sleep, improve pain to allow him to return to work    Currently in Pain? Yes    Pain Score 7     Pain Location Back    Pain Type Chronic pain                               [facility] Adult PT Treatment/Exercise - 04/05/21 0001       Lumbar Exercises: Stretches   Piriformis Stretch Right;Left;2 reps;20 seconds    Piriformis Stretch Limitations in sitting      Lumbar Exercises: Aerobic   Nustep L5 x6 min with PT present to discuss progress.      Lumbar Exercises: Standing   Other Standing Lumbar Exercises standing on planks of back of chair      Moist Heat Therapy   Number Minutes Moist Heat 5 Minutes    Moist Heat Location Lumbar Spine      Electrical Stimulation   Electrical Stimulation Location bil gluteals    Electrical Stimulation Action pre-mod  Electrical Stimulation Parameters with DN 8 min    Electrical Stimulation Goals Pain      Manual Therapy   Soft tissue mobilization bil lumbar paraspinals and right gluteals              Trigger Point Dry Needling - 04/05/21 0001     Consent Given? Yes    Muscles Treated Back/Hip Gluteus minimus;Gluteus medius;Gluteus maximus;Piriformis    Electrical Stimulation Performed with Dry Needling Yes    E-stim with Dry Needling Details right L5-S1 paraspinal and superior gluteal region; also left gluteal with ES    Gluteus Minimus Response Palpable increased muscle length    Gluteus Medius Response Palpable increased muscle length    Gluteus Maximus Response Palpable increased muscle length    Piriformis Response Palpable increased muscle length    Lumbar multifidi Response Palpable increased muscle length;Twitch response elicited                     PT Short Term Goals - 02/19/21  1059       PT SHORT TERM GOAL #1   Title Pt will be independent with his initial HEP to improve flexibility and understanding of proper body mechanics.    Status Achieved               PT Long Term Goals - 03/23/21 1113       PT LONG TERM GOAL #1   Title Pt will have atleast 4/5 MMT of BLE which will improve his efficiency with walking and other ADLs.    Time 6    Period Weeks    Status On-going      PT LONG TERM GOAL #2   Title Pt will report atleast 60% improvement in his back pain and function from the start of PT which will improve his ability to participate in work related tasks.    Time 6    Period Weeks    Status Partially Met      PT LONG TERM GOAL #3   Title Pt will be able to complete 5x sit to stand in less than 14 sec without UE support to reflect improvements in his trunk/LE strength.    Time 6    Period Weeks    Status On-going      PT LONG TERM GOAL #4   Title Pt will be able to complete bed mobility such as rolling and supine to/from sit without the need for UE support or increase in low back pain.    Time 6    Period Weeks    Status Partially Met      PT LONG TERM GOAL #5   Title Pt will be able to complete 10 straight single leg raises on each LE without abdominal coning to reflect improvements in abdominal strength and endurance.    Time 6    Period Weeks    Status Partially Met                   Plan - 04/05/21 0958     Clinical Impression Statement The patient reports a good response to added ES to DN on right side with relief of great toe symptoms last visit.  Added DN and ES to left gluteals with visible muscle contraction in addition to right DN/ES.   The patient reports he "went from a 7/10 to a 5/10 easily" post treatment session with improved lumbar mobility in frontal and sagittal planes noted.  He demonstrates  good carryover with stretching routine which should compliment DN well.  Therapist closely monitoring response  throughout treatment session.    Personal Factors and Comorbidities Age;Comorbidity 1;Time since onset of injury/illness/exacerbation    Comorbidities surgery 10/19/20    Examination-Activity Limitations Stand;Lift;Bed Mobility;Transfers    Stability/Clinical Decision Making Evolving/Moderate complexity    Rehab Potential Good    PT Frequency 2x / week    PT Duration 6 weeks    PT Treatment/Interventions ADLs/Self Care Home Management;Cryotherapy;Moist Heat;Neuromuscular re-education;Balance training;Therapeutic exercise;Therapeutic activities;Functional mobility training;Patient/family education;Gait training;Manual techniques;Dry needling;Passive range of motion;Taping    PT Next Visit Plan DN/ES; add core strengthening    PT Home Exercise Plan Access Code: [place]             Patient will benefit from skilled therapeutic intervention in order to improve the following deficits and impairments:  Abnormal gait, Decreased range of motion, Increased muscle spasms, Decreased activity tolerance, Pain, Impaired flexibility, Hypomobility, Improper body mechanics, Postural dysfunction, Decreased mobility, Decreased strength  Visit Diagnosis: Muscle weakness (generalized)  Muscle spasm of back  Low back pain, unspecified back pain laterality, unspecified chronicity, unspecified whether sciatica present     Problem List Patient Active Problem List   Diagnosis Date Noted   Spondylolisthesis of lumbosacral region 10/19/2020   Chronic tension-type headache, not intractable 06/23/2020   Hyperlipidemia    GERD (gastroesophageal reflux disease)    Excessive daytime sleepiness 01/12/2018   Seasonal and perennial allergic rhinitis 01/12/2018   CAD (coronary artery disease) 03/25/2014   Pain of left calf 03/25/2014   Morbid obesity ([place]) 03/09/2014   Midsternal chest pain 03/09/2014   Unstable angina ([place]) 03/08/2014   Hypothyroidism 03/08/2014   Chest pain    OSA (obstructive sleep apnea)  07/27/2012   Ruben Im, PT 04/05/21 4:58 PM Phone: [phone] Fax: [phone]   Alvera Singh, PT 04/05/2021, 4:58 PM  [facility] @ [address] [place], [place], [address] Phone: [phone]   Fax:  [phone]  Name: Jerry Keller MRN: [id number] Date of Birth: December 02, 1960

## 2021-04-13 ENCOUNTER — Encounter: Payer: Self-pay | Admitting: Rehabilitative and Restorative Service Providers"

## 2021-04-13 ENCOUNTER — Ambulatory Visit: Payer: BC Managed Care – PPO | Admitting: Rehabilitative and Restorative Service Providers"

## 2021-04-13 ENCOUNTER — Other Ambulatory Visit: Payer: Self-pay

## 2021-04-13 DIAGNOSIS — M62830 Muscle spasm of back: Secondary | ICD-10-CM

## 2021-04-13 DIAGNOSIS — M6281 Muscle weakness (generalized): Secondary | ICD-10-CM | POA: Diagnosis not present

## 2021-04-13 DIAGNOSIS — M545 Low back pain, unspecified: Secondary | ICD-10-CM

## 2021-04-13 NOTE — Therapy (Signed)
[facility] @ [place], [place], [address] Phone: [phone]   Fax:  [phone]  Physical Therapy Treatment  Patient Details  Name: Jerry Keller MRN: [id number] Date of Birth: 02-Mar-1961 Referring Provider (PT): [place], NP   Encounter Date: 04/13/2021   PT End of Session - 04/13/21 0936     Visit Number 13    Date for PT Re-Evaluation 04/20/21    Authorization Type BCBS    Authorization Time Period 03/27/2021 to 04/20/21    PT Start Time 0931    PT Stop Time 1010    PT Time Calculation (min) 39 min    Activity Tolerance Patient tolerated treatment well    Behavior During Therapy [facility] for tasks assessed/performed             Past Medical History:  Diagnosis Date   Anxiety    GERD (gastroesophageal reflux disease)    Headache, cluster    Hyperlipidemia    a. Mild - statin started 02/2014.   Hypothyroidism    Non-obstructive CAD    a. 02/2014 Cath: LM nl, LAD 69md, LCX nl, RCA 20p/m, EF 55-60%-->Med Rx.   Palpitations    none recently   Sleep apnea    uses cpap    Past Surgical History:  Procedure Laterality Date   COLONOSCOPY N/A 07/30/2013   Procedure: COLONOSCOPY;  Surgeon: PBeryle Beams MD;  [facility];  Service: Endoscopy;  Laterality: N/A;   LEFT HEART CATH AND CORONARY ANGIOGRAPHY N/A 06/23/2019   Procedure: LEFT HEART CATH AND CORONARY ANGIOGRAPHY;  Surgeon: CSherren Mocha MD;  [facility];  Service: Cardiovascular;  Laterality: N/A;   LEFT HEART CATHETERIZATION WITH CORONARY ANGIOGRAM N/A 03/08/2014   Procedure: LEFT HEART CATHETERIZATION WITH CORONARY ANGIOGRAM;  Surgeon: CBurnell Blanks MD;  [facility];  Service: Cardiovascular;  Laterality: N/A;   TONSILLECTOMY AND ADENOIDECTOMY     as a child    There were no vitals filed for this visit.   Subjective Assessment - 04/13/21 0934     Subjective Pt reports that he can tell  a big difference with using the dry needling.  Pt reports feeling a 'hot spot' on R groin area. Pt reports only 1 bathroom accident since last visit.    Patient Stated Goals improve sleep, improve pain to allow him to return to work    Currently in Pain? Yes    Pain Score 5     Pain Location Back    Pain Orientation Right    Pain Descriptors / Indicators Burning    Pain Type Chronic pain                               [facility] Adult PT Treatment/Exercise - 04/13/21 0001       Lumbar Exercises: Stretches   Lower Trunk Rotation 5 reps;10 seconds    Piriformis Stretch Right;Left;2 reps;20 seconds    Piriformis Stretch Limitations in hooklying    Other Lumbar Stretch Exercise hooklying hip adduction/butterfly stretch 2x20 sec B      Lumbar Exercises: Aerobic   Nustep L5 x6 min with PT present to discuss progress.   389 steps     Lumbar Exercises: Machines for Strengthening   Leg Press 70# 2x10      Lumbar Exercises: Standing   Functional Squats 20 reps   holding 5# kettlebell   Functional  Squats Limitations partial sit to/from stand using 2 blue foam pads on PT mat for tactile cuing      Lumbar Exercises: Seated   Long Arc Quad on [place] Strengthening;Both;1 set;15 reps    LAQ on [place] Limitations with cuing for maintaining TA contraction    Hip Flexion on [employer] Strengthening;Both;15 reps    Hip Flexion on Ball Limitations marching with holding ab set      Lumbar Exercises: Supine   Bent Knee Raise 20 reps    Bent Knee Raise Limitations holding posterior pelvic tilt      Manual Therapy   Manual Therapy Soft tissue mobilization;Myofascial release    Manual therapy comments skilled palpation to identify trigger points    Soft tissue mobilization bil lumbar paraspinals and right hip adductors    Myofascial Release trigger point release to R lumbar paraspinals and R hip adductor              Trigger Point Dry Needling - 04/13/21 0001     Consent Given? Yes     Education Handout Provided Previously provided    Muscles Treated Lower Quadrant Adductor longus/brevis/magnus    Muscles Treated Back/Hip Lumbar multifidi    Adductor Response Twitch response elicited;Palpable increased muscle length    Lumbar multifidi Response Twitch response elicited;Palpable increased muscle length                     PT Short Term Goals - 02/19/21 1059       PT SHORT TERM GOAL #1   Title Pt will be independent with his initial HEP to improve flexibility and understanding of proper body mechanics.    Status Achieved               PT Long Term Goals - 04/13/21 1040       PT LONG TERM GOAL #1   Title Pt will have atleast 4/5 MMT of BLE which will improve his efficiency with walking and other ADLs.    Status On-going      PT LONG TERM GOAL #2   Title Pt will report atleast 60% improvement in his back pain and function from the start of PT which will improve his ability to participate in work related tasks.    Status Partially Met                   Plan - 04/13/21 1022     Clinical Impression Statement Jerry Keller continues to report a decrease in pain following DN with reports of 3/10 pain following.  Following dry needling to R hip adductor, pt reported not longer feeling the "hot spot". Pt tolererated addition of new ther ex for core stability well, but did report feelings of tightness in his back. Pt requires cuing during ther ex for posure and technique. Jerry Keller educated on the improtance of continued stretching and HEP at home to continue to progress towards goals, he verbalizes understanding.  He continues to require skilled PT to progress towards goal related activities and decreased pain.    PT Treatment/Interventions ADLs/Self Care Home Management;Cryotherapy;Moist Heat;Neuromuscular re-education;Balance training;Therapeutic exercise;Therapeutic activities;Functional mobility training;Patient/family education;Gait  training;Manual techniques;Dry needling;Passive range of motion;Taping    PT Next Visit Plan DN/ES; add core strengthening    Consulted and Agree with Plan of Care Patient             Patient will benefit from skilled therapeutic intervention in order to improve the following deficits and impairments:  Abnormal gait, Decreased range of motion, Increased muscle spasms, Decreased activity tolerance, Pain, Impaired flexibility, Hypomobility, Improper body mechanics, Postural dysfunction, Decreased mobility, Decreased strength  Visit Diagnosis: Muscle weakness (generalized)  Muscle spasm of back  Low back pain, unspecified back pain laterality, unspecified chronicity, unspecified whether sciatica present     Problem List Patient Active Problem List   Diagnosis Date Noted   Spondylolisthesis of lumbosacral region 10/19/2020   Chronic tension-type headache, not intractable 06/23/2020   Hyperlipidemia    GERD (gastroesophageal reflux disease)    Excessive daytime sleepiness 01/12/2018   Seasonal and perennial allergic rhinitis 01/12/2018   CAD (coronary artery disease) 03/25/2014   Pain of left calf 03/25/2014   Morbid obesity ([place]) 03/09/2014   Midsternal chest pain 03/09/2014   Unstable angina ([place]) 03/08/2014   Hypothyroidism 03/08/2014   Chest pain    OSA (obstructive sleep apnea) 07/27/2012    Juel Burrow, PT, DPT 04/13/2021, 10:41 AM  [facility] @ [place], [place], [address] Phone: [phone]   Fax:  [phone]  Name: Quavion Boule MRN: [id number] Date of Birth: 03/09/1961

## 2021-04-19 ENCOUNTER — Other Ambulatory Visit: Payer: Self-pay | Admitting: Neurosurgery

## 2021-04-19 DIAGNOSIS — R519 Headache, unspecified: Secondary | ICD-10-CM

## 2021-04-23 ENCOUNTER — Encounter: Payer: BC Managed Care – PPO | Admitting: Rehabilitative and Restorative Service Providers"

## 2021-04-27 ENCOUNTER — Ambulatory Visit: Payer: BC Managed Care – PPO | Attending: Student | Admitting: Physical Therapy

## 2021-04-27 ENCOUNTER — Other Ambulatory Visit: Payer: Self-pay

## 2021-04-27 DIAGNOSIS — M62830 Muscle spasm of back: Secondary | ICD-10-CM | POA: Diagnosis present

## 2021-04-27 DIAGNOSIS — M545 Low back pain, unspecified: Secondary | ICD-10-CM | POA: Insufficient documentation

## 2021-04-27 DIAGNOSIS — M6281 Muscle weakness (generalized): Secondary | ICD-10-CM | POA: Diagnosis not present

## 2021-04-27 NOTE — Therapy (Signed)
[facility] @ [address] [place], [place], [address] Phone: [phone]   Fax:  [phone]  Physical Therapy Treatment/Recertification  Patient Details  Name: Jerry Keller MRN: [id number] Date of Birth: [birth year] Referring Provider (PT): [place], NP   Encounter Date: 04/27/2021   PT End of Session - 04/27/21 1232     Visit Number 14    Date for PT Re-Evaluation 06/22/21    Authorization Type BCBS    PT Start Time 5852    PT Stop Time 0853    PT Time Calculation (min) 58 min    Activity Tolerance Patient tolerated treatment well             Past Medical History:  Diagnosis Date   Anxiety    GERD (gastroesophageal reflux disease)    Headache, cluster    Hyperlipidemia    a. Mild - statin started 02/2014.   Hypothyroidism    Non-obstructive CAD    a. 02/2014 Cath: LM nl, LAD 73m/d, LCX nl, RCA 20p/m, EF 55-60%-->Med Rx.   Palpitations    none recently   Sleep apnea    uses cpap    Past Surgical History:  Procedure Laterality Date   COLONOSCOPY N/A 07/30/2013   Procedure: COLONOSCOPY;  Surgeon: Beryle Beams, MD;  Location: [facility];  Service: Endoscopy;  Laterality: N/A;   LEFT HEART CATH AND CORONARY ANGIOGRAPHY N/A 06/23/2019   Procedure: LEFT HEART CATH AND CORONARY ANGIOGRAPHY;  Surgeon: [name], MD;  Location: [facility];  Service: Cardiovascular;  Laterality: N/A;   LEFT HEART CATHETERIZATION WITH CORONARY ANGIOGRAM N/A 03/08/2014   Procedure: LEFT HEART CATHETERIZATION WITH CORONARY ANGIOGRAM;  Surgeon: Burnell Blanks, MD;  Location: [facility] CATH LAB;  Service: Cardiovascular;  Laterality: N/A;   TONSILLECTOMY AND ADENOIDECTOMY     as a child    There were no vitals filed for this visit.   Subjective Assessment - 04/27/21 0758     Subjective I was really sore after last time.  Had NCV this week but no results.  Rates overall progress at 60%.  Had to miss Monday  while testing for covid.  I missed being here.   I realize how much I need to be there.  Back to work using brace since up and down steps but modifiying as needed.   I sleep 7 hours/night.    Pertinent History anxiety, GERD, L heart cath    Patient Stated Goals improve sleep, improve pain to allow him to return to work    Currently in Pain? Yes    Pain Score 6     Pain Location Back    Pain Radiating Towards left LE                [facility] PT Assessment - 04/27/21 0001       Assessment   Medical Diagnosis s/p arthrodesis L5/S1    Referring Provider (PT) Trinda Pascal Summer, NP    Onset Date/Surgical Date 10/19/20      Strength   Overall Strength Comments needs signficant UE assist with sit to stand even with hips higher than knees    Right Hip Flexion 3+/5   3-5 Reps tolerated of SLR on each side   Right Hip ABduction 3+/5    Left Hip Flexion 3/5    Left Hip ABduction 3-/5    Lumbar Flexion 4-/5    Lumbar Extension 4-/5      Bed Mobility  Bed Mobility Rolling Right;Rolling Left;Right Sidelying to Sit;Left Sidelying to Sit   much improved rolling and sidelying to sit compared to initial eval-min UE use needed now     Standardized Balance Assessment   Five times sit to stand comments  27   UE use on thighs; unable to rise without UE use even with 2 cushions                          [facility] Adult PT Treatment/Exercise - 04/27/21 0001       Lumbar Exercises: Aerobic   Nustep L4 7 min      Lumbar Exercises: Standing   Other Standing Lumbar Exercises 2 5# weights wit hmodified dead lifts      Knee/Hip Exercises: Seated   Sit to Sand --   2 cushions and with UE assist with chair     Moist Heat Therapy   Number Minutes Moist Heat 5 Minutes    Moist Heat Location Lumbar Spine      Electrical Stimulation   Electrical Stimulation Location left lumbar multifidi    Electrical Stimulation Action pre-mod    Electrical Stimulation Parameters with DN 8 min       Manual Therapy   Soft tissue mobilization bil lumbar paraspinals and left gluteals              Trigger Point Dry Needling - 04/27/21 0001     Consent Given? Yes    Muscles Treated Back/Hip Gluteus minimus;Gluteus medius;Gluteus maximus;Piriformis    Dry Needling Comments left only    Electrical Stimulation Performed with Dry Needling Yes    E-stim with Dry Needling Details left  L5-S1 paraspinal and superior gluteal region; also left gluteal with ES    Gluteus Minimus Response Palpable increased muscle length    Gluteus Medius Response Palpable increased muscle length    Gluteus Maximus Response Palpable increased muscle length    Piriformis Response --    Lumbar multifidi Response Palpable increased muscle length;Twitch response elicited                     PT Short Term Goals - 04/27/21 1242       PT SHORT TERM GOAL #1   Title Pt will be independent with his initial HEP to improve flexibility and understanding of proper body mechanics.    Status Achieved               PT Long Term Goals - 04/27/21 1242       PT LONG TERM GOAL #1   Title Pt will have atleast 4/5 MMT of BLE which will improve his efficiency with walking and other ADLs.    Time 8    Period Weeks    Status On-going    Target Date 06/22/21      PT LONG TERM GOAL #2   Title Pt will report atleast 75% improvement in his back pain and function from the start of PT which will improve his ability to participate in work related tasks.    Time 8    Period Weeks    Status Revised      PT LONG TERM GOAL #3   Title Pt will be able to complete 5x sit to stand with min to mod UE assist in less than 20 sec to reflect improvements in his trunk/LE strength.    Time 8    Period Weeks    Status Revised  PT LONG TERM GOAL #4   Title Pt will be able to complete bed mobility such as rolling and supine to/from sit without the need for UE support or increase in low back pain.    Status Achieved       PT LONG TERM GOAL #5   Title The patient will have LE strength needed to rise 1x from a standard height chair with min UE assist    Time 8    Period Weeks    Status Revised      Additional Long Term Goals   Additional Long Term Goals Yes      PT LONG TERM GOAL #6   Title Modified Oswestry Index goal to be set next visit    Time 8    Period Weeks    Status New                   Plan - 04/27/21 1233     Clinical Impression Statement The patient rates his progress at 60%.  He demonstrates progress in trunk strength with the ability to roll and transfer sidelying to sit with min UE assist now.  He reports decreased LE symptoms and improvement in sleep from 2.5 hours/night to 7 hours.  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  Considerable LE weakness persists with hip flexion and hip abduction, particularly the left 3-/5.  He is unable to rise sit to stand without considerable UE assist even from a very elevated seat height.  He reports symptom relief with DN and DN coupled with ES.  He is progressing with rehab goals but would benefit from continued skilled rehab for a further progression of exercise and further symptom management.    Personal Factors and Comorbidities Age;Comorbidity 1;Time since onset of injury/illness/exacerbation    Comorbidities surgery 10/19/20    Examination-Activity Limitations Stand;Lift;Bed Mobility;Transfers    Examination-Participation Restrictions Occupation;Yard Work    [employer] Evolving/Moderate complexity    Rehab Potential Good    PT Frequency 2x / week    PT Duration 6 weeks    PT Treatment/Interventions ADLs/Self Care Home Management;Cryotherapy;Moist Heat;Neuromuscular re-education;Balance training;Therapeutic exercise;Therapeutic activities;Functional mobility training;Patient/family  education;Gait training;Manual techniques;Dry needling;Passive range of motion;Taping    PT Next Visit Plan Give Modified Oswestry Index next visit to set LTG; DN/ES; core strengthening; sit to stand variations; modified partial dead lifts 2 5# (increase)    PT Home Exercise Plan Access Code: [email or web address]             Patient will benefit from skilled therapeutic intervention in order to improve the following deficits and impairments:  Abnormal gait, Decreased range of motion, Increased muscle spasms, Decreased activity tolerance, Pain, Impaired flexibility, Hypomobility, Improper body mechanics, Postural dysfunction, Decreased mobility, Decreased strength  Visit Diagnosis: Muscle weakness (generalized) - Plan: PT plan of care cert/re-cert  Muscle spasm of back - Plan: PT plan of care cert/re-cert  Low back pain, unspecified back pain laterality, unspecified chronicity, unspecified whether sciatica present - Plan: PT plan of care cert/re-cert     Problem List Patient Active Problem List   Diagnosis Date Noted   Spondylolisthesis of lumbosacral region 10/19/2020   Chronic tension-type headache, not intractable 06/23/2020   Hyperlipidemia    GERD (gastroesophageal reflux disease)    Excessive daytime sleepiness 01/12/2018   Seasonal and perennial allergic rhinitis 01/12/2018  CAD (coronary artery disease) 03/25/2014   Pain of left calf 03/25/2014   Morbid obesity ([place]) 03/09/2014   Midsternal chest pain 03/09/2014   Unstable angina ([place]) 03/08/2014   Hypothyroidism 03/08/2014   Chest pain    OSA (obstructive sleep apnea) 07/27/2012   Ruben Im, PT 04/27/21 12:54 PM Phone: [phone] Fax: [phone]  Alvera Singh, PT 04/27/2021, 12:54 PM  [facility] @ [address] [place], [place], [address] Phone: [phone]   Fax:  [phone]  Name: Jerry Keller MRN: [id number] Date of Birth: Nov 28, 1960

## 2021-04-30 ENCOUNTER — Other Ambulatory Visit: Payer: Self-pay

## 2021-04-30 ENCOUNTER — Encounter: Payer: Self-pay | Admitting: Rehabilitative and Restorative Service Providers"

## 2021-04-30 ENCOUNTER — Ambulatory Visit: Payer: BC Managed Care – PPO | Admitting: Rehabilitative and Restorative Service Providers"

## 2021-04-30 DIAGNOSIS — M6281 Muscle weakness (generalized): Secondary | ICD-10-CM | POA: Diagnosis not present

## 2021-04-30 DIAGNOSIS — M62830 Muscle spasm of back: Secondary | ICD-10-CM

## 2021-04-30 DIAGNOSIS — M545 Low back pain, unspecified: Secondary | ICD-10-CM

## 2021-04-30 NOTE — Therapy (Signed)
[facility] @ [address] [place], [place], [address] Phone: [phone]   Fax:  [phone]  Physical Therapy Treatment  Patient Details  Name: Jerry Keller MRN: [id number] Date of Birth: November 08, 1960 Referring Provider (PT): [place], NP   Encounter Date: 04/30/2021   PT End of Session - 04/30/21 1013     Visit Number 15    Date for PT Re-Evaluation 06/22/21    Authorization Type BCBS    Authorization Time Period 04/21/21 - 06/22/21    PT Start Time 1010    PT Stop Time 1050    PT Time Calculation (min) 40 min    Activity Tolerance Patient tolerated treatment well    Behavior During Therapy [facility] for tasks assessed/performed             Past Medical History:  Diagnosis Date   Anxiety    GERD (gastroesophageal reflux disease)    Headache, cluster    Hyperlipidemia    a. Mild - statin started 02/2014.   Hypothyroidism    Non-obstructive CAD    a. 02/2014 Cath: LM nl, LAD 46m/d, LCX nl, RCA 20p/m, EF 55-60%-->Med Rx.   Palpitations    none recently   Sleep apnea    uses cpap    Past Surgical History:  Procedure Laterality Date   COLONOSCOPY N/A 07/30/2013   Procedure: COLONOSCOPY;  Surgeon: Beryle Beams, MD;  Location: [facility];  Service: Endoscopy;  Laterality: N/A;   LEFT HEART CATH AND CORONARY ANGIOGRAPHY N/A 06/23/2019   Procedure: LEFT HEART CATH AND CORONARY ANGIOGRAPHY;  Surgeon: [name], MD;  Location: [facility];  Service: Cardiovascular;  Laterality: N/A;   LEFT HEART CATHETERIZATION WITH CORONARY ANGIOGRAM N/A 03/08/2014   Procedure: LEFT HEART CATHETERIZATION WITH CORONARY ANGIOGRAM;  Surgeon: Burnell Blanks, MD;  Location: [facility] CATH LAB;  Service: Cardiovascular;  Laterality: N/A;   TONSILLECTOMY AND ADENOIDECTOMY     as a child    There were no vitals filed for this visit.   Subjective Assessment - 04/30/21 1010     Subjective I think my kids have been trying  to exercise me too much.  I think I overdid it, as I had an accident after it.    Pertinent History anxiety, GERD, L heart cath    Patient Stated Goals improve sleep, improve pain to allow him to return to work    Currently in Pain? Yes    Pain Score 5     Pain Location Back    Pain Orientation Right;Left;Lower    Pain Descriptors / Indicators Aching;Stabbing    Pain Type Chronic pain    Pain Radiating Towards down BLE    Pain Onset More than a month ago                               [facility] Adult PT Treatment/Exercise - 04/30/21 0001       Lumbar Exercises: Stretches   Other Lumbar Stretch Exercise 3 way green pball roll out 5 sec x5 each with cuing for a gentle stretch.      Lumbar Exercises: Aerobic   Nustep L4 7 min      Lumbar Exercises: Standing   Other Standing Lumbar Exercises 2 5# weights with modified dead lifts x10.      Lumbar Exercises: Seated   Long Arc Quad on [place] Strengthening;Both;1 set;15 reps    [place]  on Ball Limitations with cuing for maintaining TA contraction    Hip Flexion on Ball Strengthening;Both;15 reps    Hip Flexion on Ball Limitations marching with holding ab set    Sit to Stand 10 reps   with 2 blue foam pads on chair seat.     Lumbar Exercises: Supine   Bent Knee Raise 20 reps    Bent Knee Raise Limitations holding posterior pelvic tilt      Manual Therapy   Manual Therapy Soft tissue mobilization;Myofascial release    Manual therapy comments skilled palpation to identify trigger points    Soft tissue mobilization bil lumbar paraspinals    Myofascial Release trigger point release to B lumbar paraspinals              Trigger Point Dry Needling - 04/30/21 0001     Consent Given? Yes    Education Handout Provided Previously provided    Muscles Treated Back/Hip Lumbar multifidi    Lumbar multifidi Response Palpable increased muscle length;Twitch response elicited                     PT Short Term Goals -  04/27/21 1242       PT SHORT TERM GOAL #1   Title Pt will be independent with his initial HEP to improve flexibility and understanding of proper body mechanics.    Status Achieved               PT Long Term Goals - 04/27/21 1242       PT LONG TERM GOAL #1   Title Pt will have atleast 4/5 MMT of BLE which will improve his efficiency with walking and other ADLs.    Time 8    Period Weeks    Status On-going    Target Date 06/22/21      PT LONG TERM GOAL #2   Title Pt will report atleast 75% improvement in his back pain and function from the start of PT which will improve his ability to participate in work related tasks.    Time 8    Period Weeks    Status Revised      PT LONG TERM GOAL #3   Title Pt will be able to complete 5x sit to stand with min to mod UE assist in less than 20 sec to reflect improvements in his trunk/LE strength.    Time 8    Period Weeks    Status Revised      PT LONG TERM GOAL #4   Title Pt will be able to complete bed mobility such as rolling and supine to/from sit without the need for UE support or increase in low back pain.    Status Achieved      PT LONG TERM GOAL #5   Title The patient will have LE strength needed to rise 1x from a standard height chair with min UE assist    Time 8    Period Weeks    Status Revised      Additional Long Term Goals   Additional Long Term Goals Yes      PT LONG TERM GOAL #6   Title Modified Oswestry Index goal to be set next visit    Time 8    Period Weeks    Status New                   Plan - 04/30/21 1054     Clinical Impression  Statement Jerry Keller presents with increased tightness noted along his lumbar paraspinals.  He was educated to perform the HEP provided by PTs and not perform too advanced activities, as recommended by his children. Pt reported a decrease in pain to  3/10 following dry needling to lumbar spine. Pt educated to continue to perform pelvic floor contractions, as he  admits that he has slacked off on these exercises and has noted an increase of incontinent episodes,, pt verbalizes understanding. Pt provided with cuing throughout session for core stability and pelvic floor contractions.    Personal Factors and Comorbidities Age;Comorbidity 1;Time since onset of injury/illness/exacerbation    Comorbidities surgery 10/19/20    PT Treatment/Interventions ADLs/Self Care Home Management;Cryotherapy;Moist Heat;Neuromuscular re-education;Balance training;Therapeutic exercise;Therapeutic activities;Functional mobility training;Patient/family education;Gait training;Manual techniques;Dry needling;Passive range of motion;Taping    PT Next Visit Plan Give Modified Oswestry Index next visit to set LTG; DN/ES; core strengthening; sit to stand variations; modified partial dead lifts 2 5# (increase)    Consulted and Agree with Plan of Care Patient             Patient will benefit from skilled therapeutic intervention in order to improve the following deficits and impairments:  Abnormal gait, Decreased range of motion, Increased muscle spasms, Decreased activity tolerance, Pain, Impaired flexibility, Hypomobility, Improper body mechanics, Postural dysfunction, Decreased mobility, Decreased strength  Visit Diagnosis: Muscle weakness (generalized)  Muscle spasm of back  Low back pain, unspecified back pain laterality, unspecified chronicity, unspecified whether sciatica present     Problem List Patient Active Problem List   Diagnosis Date Noted   Spondylolisthesis of lumbosacral region 10/19/2020   Chronic tension-type headache, not intractable 06/23/2020   Hyperlipidemia    GERD (gastroesophageal reflux disease)    Excessive daytime sleepiness 01/12/2018   Seasonal and perennial allergic rhinitis 01/12/2018   CAD (coronary artery disease) 03/25/2014   Pain of left calf 03/25/2014   Morbid obesity ([place]) 03/09/2014   Midsternal chest pain 03/09/2014   Unstable  angina ([place]) 03/08/2014   Hypothyroidism 03/08/2014   Chest pain    OSA (obstructive sleep apnea) 07/27/2012    Jerry Keller, PT, DPT 04/30/2021, 10:58 AM  [facility] @ [address] [place], [place], [address] Phone: [phone]   Fax:  [phone]  Name: Jerry Keller MRN: [id number] Date of Birth: May 15, 1960

## 2021-05-04 ENCOUNTER — Ambulatory Visit: Payer: BC Managed Care – PPO | Admitting: Physical Therapy

## 2021-05-04 ENCOUNTER — Other Ambulatory Visit: Payer: Self-pay

## 2021-05-04 DIAGNOSIS — M62830 Muscle spasm of back: Secondary | ICD-10-CM

## 2021-05-04 DIAGNOSIS — M6281 Muscle weakness (generalized): Secondary | ICD-10-CM | POA: Diagnosis not present

## 2021-05-04 DIAGNOSIS — M545 Low back pain, unspecified: Secondary | ICD-10-CM

## 2021-05-04 NOTE — Therapy (Signed)
[facility] @ [address] [place], [place], [address] Phone: [phone]   Fax:  [phone]  Physical Therapy Treatment  Patient Details  Name: Jerry Keller MRN: [id number] Date of Birth: 1961-02-22 Referring Provider (PT): [place], NP   Encounter Date: 05/04/2021   PT End of Session - 05/04/21 1112     Visit Number 16    Date for PT Re-Evaluation 06/22/21    Authorization Type BCBS    Authorization Time Period 04/21/21 - 06/22/21    PT Start Time 1015    PT Stop Time 1115   DN heat   PT Time Calculation (min) 60 min    Activity Tolerance Patient tolerated treatment well             Past Medical History:  Diagnosis Date   Anxiety    GERD (gastroesophageal reflux disease)    Headache, cluster    Hyperlipidemia    a. Mild - statin started 02/2014.   Hypothyroidism    Non-obstructive CAD    a. 02/2014 Cath: LM nl, LAD 35m/d, LCX nl, RCA 20p/m, EF 55-60%-->Med Rx.   Palpitations    none recently   Sleep apnea    uses cpap    Past Surgical History:  Procedure Laterality Date   COLONOSCOPY N/A 07/30/2013   Procedure: COLONOSCOPY;  Surgeon: Beryle Beams, MD;  Location: [facility];  Service: Endoscopy;  Laterality: N/A;   LEFT HEART CATH AND CORONARY ANGIOGRAPHY N/A 06/23/2019   Procedure: LEFT HEART CATH AND CORONARY ANGIOGRAPHY;  Surgeon: [name], MD;  Location: [facility];  Service: Cardiovascular;  Laterality: N/A;   LEFT HEART CATHETERIZATION WITH CORONARY ANGIOGRAM N/A 03/08/2014   Procedure: LEFT HEART CATHETERIZATION WITH CORONARY ANGIOGRAM;  Surgeon: Burnell Blanks, MD;  Location: [facility] CATH LAB;  Service: Cardiovascular;  Laterality: N/A;   TONSILLECTOMY AND ADENOIDECTOMY     as a child    There were no vitals filed for this visit.   Subjective Assessment - 05/04/21 1013     Subjective We had a break down at work but we got everything up and going.  Wednesday was the  roughest day I've had b/c of sciatica.  It caught up with me.  I had an accident Wed and Thursday.  Still no results from [facility].  They called me about setting up and MRI of my brain.  I don't know about that.  Weaning off brace unless I'm doing lifting or up and down steps a lot.    Pertinent History anxiety, GERD, L heart cath    Patient Stated Goals improve sleep, improve pain to allow him to return to work    Currently in Pain? Yes    Pain Score 2     Pain Location Leg    Pain Orientation Right;Left    Pain Radiating Towards tingling in both legs today                               [facility] Adult PT Treatment/Exercise - 05/04/21 0001       Lumbar Exercises: Aerobic   Nustep L4 7 min      Lumbar Exercises: Standing   Other Standing Lumbar Exercises 2 7# weights with modified dead lifts x10.      Lumbar Exercises: Seated   Other Seated Lumbar Exercises 7# snatch and press overhead 2 sets of 5  Lumbar Exercises: Supine   Bent Knee Raise 10 reps    Other Supine Lumbar Exercises green band bil shoulder extension 15x    Other Supine Lumbar Exercises small increments of trunk rotation 10x right/left      Moist Heat Therapy   Number Minutes Moist Heat 5 Minutes    Moist Heat Location Lumbar Spine      Electrical Stimulation   Electrical Stimulation Location bil lumbar multifidi and left glute    Electrical Stimulation Action pre-mod 1.5 ma 8 min    Electrical Stimulation Parameters with DN    Electrical Stimulation Goals Pain      Manual Therapy   Soft tissue mobilization bil lumbar paraspinals and left gluteals              Trigger Point Dry Needling - 05/04/21 0001     Consent Given? Yes    Muscles Treated Back/Hip Gluteus minimus;Gluteus medius;Gluteus maximus;Piriformis    Dry Needling Comments left only    Electrical Stimulation Performed with Dry Needling Yes    E-stim with Dry Needling Details left  L5-S1 paraspinal and superior gluteal  region; also left gluteal and left glute    Gluteus Minimus Response Palpable increased muscle length    Gluteus Medius Response Palpable increased muscle length    Gluteus Maximus Response Palpable increased muscle length    Piriformis Response Palpable increased muscle length    Lumbar multifidi Response Palpable increased muscle length;Twitch response elicited                     PT Short Term Goals - 04/27/21 1242       PT SHORT TERM GOAL #1   Title Pt will be independent with his initial HEP to improve flexibility and understanding of proper body mechanics.    Status Achieved               PT Long Term Goals - 04/27/21 1242       PT LONG TERM GOAL #1   Title Pt will have atleast 4/5 MMT of BLE which will improve his efficiency with walking and other ADLs.    Time 8    Period Weeks    Status On-going    Target Date 06/22/21      PT LONG TERM GOAL #2   Title Pt will report atleast 75% improvement in his back pain and function from the start of PT which will improve his ability to participate in work related tasks.    Time 8    Period Weeks    Status Revised      PT LONG TERM GOAL #3   Title Pt will be able to complete 5x sit to stand with min to mod UE assist in less than 20 sec to reflect improvements in his trunk/LE strength.    Time 8    Period Weeks    Status Revised      PT LONG TERM GOAL #4   Title Pt will be able to complete bed mobility such as rolling and supine to/from sit without the need for UE support or increase in low back pain.    Status Achieved      PT LONG TERM GOAL #5   Title The patient will have LE strength needed to rise 1x from a standard height chair with min UE assist    Time 8    Period Weeks    Status Revised      Additional Long  Term Goals   Additional Long Term Goals Yes      PT LONG TERM GOAL #6   Title Modified Oswestry Index goal to be set next visit    Time 8    Period Weeks    Status New                    Plan - 05/04/21 1116     Clinical Impression Statement The patient is highly concerned with having an accident while performing ex's.  States he was able to do his pelvic floor ex's and that helped but states while doing seated core ex's feeling like "he was on the edge" of incontinence.   He reports he gets quite sore after doing the ex's for an extended period of time.  Good response to DN with ES to address numerous tender points in left glutes and piriformis.   The patient may benefit from a pelvic floor specialist PT to address this ongoing issue.    Personal Factors and Comorbidities Age;Comorbidity 1;Time since onset of injury/illness/exacerbation    Comorbidities surgery 10/19/20    Examination-Activity Limitations Stand;Lift;Bed Mobility;Transfers    Examination-Participation Restrictions Occupation;[employer] Work    Rehab Potential Good    PT Frequency 2x / week    PT Duration 6 weeks    PT Treatment/Interventions ADLs/Self Care Home Management;Cryotherapy;Moist Heat;Neuromuscular re-education;Balance training;Therapeutic exercise;Therapeutic activities;Functional mobility training;Patient/family education;Gait training;Manual techniques;Dry needling;Passive range of motion;Taping    PT Next Visit Plan Give Modified Oswestry Index next visit to set LTG; DN/ES; core strengthening; sit to stand variations; modified partial dead lifts 2 5# (increase); consider referral for pelvic floor PT Malachy Mood)    PT Home Exercise Plan Access Code: [email or web address]             Patient will benefit from skilled therapeutic intervention in order to improve the following deficits and impairments:  Abnormal gait, Decreased range of motion, Increased muscle spasms, Decreased activity tolerance, Pain, Impaired flexibility, Hypomobility, Improper body mechanics, Postural dysfunction, Decreased mobility, Decreased strength  Visit Diagnosis: Muscle weakness (generalized)  Muscle spasm of back  Low back  pain, unspecified back pain laterality, unspecified chronicity, unspecified whether sciatica present     Problem List Patient Active Problem List   Diagnosis Date Noted   Spondylolisthesis of lumbosacral region 10/19/2020   Chronic tension-type headache, not intractable 06/23/2020   Hyperlipidemia    GERD (gastroesophageal reflux disease)    Excessive daytime sleepiness 01/12/2018   Seasonal and perennial allergic rhinitis 01/12/2018   CAD (coronary artery disease) 03/25/2014   Pain of left calf 03/25/2014   Morbid obesity ([place]) 03/09/2014   Midsternal chest pain 03/09/2014   Unstable angina ([place]) 03/08/2014   Hypothyroidism 03/08/2014   Chest pain    OSA (obstructive sleep apnea) 07/27/2012   Ruben Im, PT 05/04/21 11:35 AM Phone: [phone] Fax: [phone]  Alvera Singh, PT 05/04/2021, 11:35 AM  [facility] @ [address] [place], [place], [address] Phone: [phone]   Fax:  [phone]  Name: Fernand Sorbello MRN: [id number] Date of Birth: 1961/02/19

## 2021-05-07 ENCOUNTER — Ambulatory Visit: Payer: BC Managed Care – PPO | Admitting: Rehabilitative and Restorative Service Providers"

## 2021-05-07 ENCOUNTER — Other Ambulatory Visit: Payer: Self-pay

## 2021-05-07 ENCOUNTER — Encounter: Payer: Self-pay | Admitting: Rehabilitative and Restorative Service Providers"

## 2021-05-07 DIAGNOSIS — M62830 Muscle spasm of back: Secondary | ICD-10-CM

## 2021-05-07 DIAGNOSIS — M545 Low back pain, unspecified: Secondary | ICD-10-CM

## 2021-05-07 DIAGNOSIS — M6281 Muscle weakness (generalized): Secondary | ICD-10-CM | POA: Diagnosis not present

## 2021-05-07 NOTE — Therapy (Signed)
[facility] @ [address] [place], [place], [address] Phone: [phone]   Fax:  [phone]  Physical Therapy Treatment  Patient Details  Name: Jerry Keller MRN: [id number] Date of Birth: 02-03-1961 Referring Provider (PT): [place], NP   Encounter Date: 05/07/2021   PT End of Session - 05/07/21 1014     Visit Number 17    Date for PT Re-Evaluation 06/22/21    Authorization Type BCBS    Authorization Time Period 04/21/21 - 06/22/21    PT Start Time 1010    PT Stop Time 1050    PT Time Calculation (min) 40 min    Activity Tolerance Patient tolerated treatment well    Behavior During Therapy [facility] for tasks assessed/performed             Past Medical History:  Diagnosis Date   Anxiety    GERD (gastroesophageal reflux disease)    Headache, cluster    Hyperlipidemia    a. Mild - statin started 02/2014.   Hypothyroidism    Non-obstructive CAD    a. 02/2014 Cath: LM nl, LAD 50m/d, LCX nl, RCA 20p/m, EF 55-60%-->Med Rx.   Palpitations    none recently   Sleep apnea    uses cpap    Past Surgical History:  Procedure Laterality Date   COLONOSCOPY N/A 07/30/2013   Procedure: COLONOSCOPY;  Surgeon: Beryle Beams, MD;  Location: [facility];  Service: Endoscopy;  Laterality: N/A;   LEFT HEART CATH AND CORONARY ANGIOGRAPHY N/A 06/23/2019   Procedure: LEFT HEART CATH AND CORONARY ANGIOGRAPHY;  Surgeon: [name], MD;  Location: [facility];  Service: Cardiovascular;  Laterality: N/A;   LEFT HEART CATHETERIZATION WITH CORONARY ANGIOGRAM N/A 03/08/2014   Procedure: LEFT HEART CATHETERIZATION WITH CORONARY ANGIOGRAM;  Surgeon: Burnell Blanks, MD;  Location: [facility] CATH LAB;  Service: Cardiovascular;  Laterality: N/A;   TONSILLECTOMY AND ADENOIDECTOMY     as a child    There were no vitals filed for this visit.   Subjective Assessment - 05/07/21 1012     Subjective Pt reports increased pain over  the weekend, possibly due to the rain yesterday.  Pt reports that he is feeling better today and has been exercising with physioball.    Pertinent History anxiety, GERD, L heart cath    Patient Stated Goals improve sleep, improve pain to allow him to return to work    Currently in Pain? Yes    Pain Score 3     Pain Location Back    Pain Orientation Lower    Pain Descriptors / Indicators Aching;Sore    Pain Type Chronic pain    Pain Radiating Towards denies                               [facility] Adult PT Treatment/Exercise - 05/07/21 0001       Lumbar Exercises: Stretches   Passive Hamstring Stretch Right;Left;2 reps;20 seconds    Passive Hamstring Stretch Limitations standing at step    Hip Flexor Stretch Right;Left    Hip Flexor Stretch Limitations one step on step with contralateral UE reach x10 B    Pelvic Tilt 10 reps    Pelvic Tilt Limitations 4 way sitting on dynadisc      Lumbar Exercises: Aerobic   Nustep L5 x6 min with PT present to discuss status.   410 steps  Lumbar Exercises: Machines for Strengthening   Leg Press 80# 2x10, seat at 8      Lumbar Exercises: Standing   Wall Slides 20 reps;1 second    Wall Slides Limitations with cuing for TrA contraction and pelvic floor contraction    Other Standing Lumbar Exercises 2 7# weights with modified dead lifts x10.      Lumbar Exercises: Seated   Other Seated Lumbar Exercises 7# snatch and press overhead 2 sets of 5                       PT Short Term Goals - 04/27/21 1242       PT SHORT TERM GOAL #1   Title Pt will be independent with his initial HEP to improve flexibility and understanding of proper body mechanics.    Status Achieved               PT Long Term Goals - 05/07/21 1058       PT LONG TERM GOAL #1   Title Pt will have atleast 4/5 MMT of BLE which will improve his efficiency with walking and other ADLs.    Status On-going      PT LONG TERM GOAL #2   Title  Pt will report atleast 75% improvement in his back pain and function from the start of PT which will improve his ability to participate in work related tasks.    Status On-going      PT LONG TERM GOAL #3   Title Pt will be able to complete 5x sit to stand with min to mod UE assist in less than 20 sec to reflect improvements in his trunk/LE strength.    Status On-going                   Plan - 05/07/21 1054     Clinical Impression Statement Pt tolerated session well.  Continues to have some fears about incontinence, and agreeable to asking MD about a referral for pelvic health PT.  Message sent to MD to notify of pt requrest. Pt required cuing throughout session to remind for transversus abdomnus contraction and pelvic floor contraction during ther ex.  He reports feeling better when he maintains proper positioning. Performed session without dry needling today to allow for increased focus on core stability. Pt continues to require skilled PT to progress towards goal related activities.    Personal Factors and Comorbidities Age;Comorbidity 1;Time since onset of injury/illness/exacerbation    Comorbidities surgery 10/19/20    PT Treatment/Interventions ADLs/Self Care Home Management;Cryotherapy;Moist Heat;Neuromuscular re-education;Balance training;Therapeutic exercise;Therapeutic activities;Functional mobility training;Patient/family education;Gait training;Manual techniques;Dry needling;Passive range of motion;Taping    PT Next Visit Plan progress strengthening, core stability, modalities and manual therapy, as indicated.    Recommended Other Services pelvic health PT    Consulted and Agree with Plan of Care Patient             Patient will benefit from skilled therapeutic intervention in order to improve the following deficits and impairments:  Abnormal gait, Decreased range of motion, Increased muscle spasms, Decreased activity tolerance, Pain, Impaired flexibility, Hypomobility,  Improper body mechanics, Postural dysfunction, Decreased mobility, Decreased strength  Visit Diagnosis: Muscle weakness (generalized)  Muscle spasm of back  Low back pain, unspecified back pain laterality, unspecified chronicity, unspecified whether sciatica present     Problem List Patient Active Problem List   Diagnosis Date Noted   Spondylolisthesis of lumbosacral region 10/19/2020   Chronic tension-type  headache, not intractable 06/23/2020   Hyperlipidemia    GERD (gastroesophageal reflux disease)    Excessive daytime sleepiness 01/12/2018   Seasonal and perennial allergic rhinitis 01/12/2018   CAD (coronary artery disease) 03/25/2014   Pain of left calf 03/25/2014   Morbid obesity ([place]) 03/09/2014   Midsternal chest pain 03/09/2014   Unstable angina ([place]) 03/08/2014   Hypothyroidism 03/08/2014   Chest pain    OSA (obstructive sleep apnea) 07/27/2012    Juel Burrow, PT, DPT 05/07/2021, 10:59 AM  [facility] @ [address] [place], [place], [address] Phone: [phone]   Fax:  [phone]  Name: Jerry Keller MRN: [id number] Date of Birth: [birth year]

## 2021-05-11 ENCOUNTER — Other Ambulatory Visit: Payer: Self-pay

## 2021-05-11 ENCOUNTER — Other Ambulatory Visit: Payer: BC Managed Care – PPO

## 2021-05-11 ENCOUNTER — Ambulatory Visit: Payer: BC Managed Care – PPO | Admitting: Physical Therapy

## 2021-05-11 DIAGNOSIS — M6281 Muscle weakness (generalized): Secondary | ICD-10-CM

## 2021-05-11 DIAGNOSIS — M62830 Muscle spasm of back: Secondary | ICD-10-CM

## 2021-05-11 DIAGNOSIS — M545 Low back pain, unspecified: Secondary | ICD-10-CM

## 2021-05-11 NOTE — Therapy (Signed)
[facility] @ [address] [place], [place], [address] Phone: [phone]   Fax:  [phone]  Physical Therapy Treatment  Patient Details  Name: Jerry Keller MRN: [id number] Date of Birth: 11-Jun-1960 Referring Provider (PT): [place], NP   Encounter Date: 05/11/2021   PT End of Session - 05/11/21 1110     Visit Number 18    Date for PT Re-Evaluation 06/22/21    Authorization Type BCBS    Authorization Time Period 04/21/21 - 06/22/21    PT Start Time 1020    PT Stop Time 1115   DN   PT Time Calculation (min) 55 min    Activity Tolerance Patient tolerated treatment well             Past Medical History:  Diagnosis Date   Anxiety    GERD (gastroesophageal reflux disease)    Headache, cluster    Hyperlipidemia    a. Mild - statin started 02/2014.   Hypothyroidism    Non-obstructive CAD    a. 02/2014 Cath: LM nl, LAD 65m/d, LCX nl, RCA 20p/m, EF 55-60%-->Med Rx.   Palpitations    none recently   Sleep apnea    uses cpap    Past Surgical History:  Procedure Laterality Date   COLONOSCOPY N/A 07/30/2013   Procedure: COLONOSCOPY;  Surgeon: Beryle Beams, MD;  Location: [facility];  Service: Endoscopy;  Laterality: N/A;   LEFT HEART CATH AND CORONARY ANGIOGRAPHY N/A 06/23/2019   Procedure: LEFT HEART CATH AND CORONARY ANGIOGRAPHY;  Surgeon: [name], MD;  Location: [facility];  Service: Cardiovascular;  Laterality: N/A;   LEFT HEART CATHETERIZATION WITH CORONARY ANGIOGRAM N/A 03/08/2014   Procedure: LEFT HEART CATHETERIZATION WITH CORONARY ANGIOGRAM;  Surgeon: Burnell Blanks, MD;  Location: [facility] CATH LAB;  Service: Cardiovascular;  Laterality: N/A;   TONSILLECTOMY AND ADENOIDECTOMY     as a child    There were no vitals filed for this visit.   Subjective Assessment - 05/11/21 1023     Subjective This is my best week yet.  I was able to walk 7.8 miles and then 9 miles at work this  week.  Soreness but not pain.  I've lost 13 pounds.   This week no incontinence issues.  I talked to Dr. Arnoldo Morale office and they want [place] "to do what you're doing already."  The only thing that showed up on Nerve conduction was severe carpal tunnel.    Pertinent History anxiety, GERD, L heart cath    Patient Stated Goals improve sleep, improve pain to allow him to return to work    Currently in Pain? No/denies    Pain Score 0-No pain   soreness rather than pain                              [facility] Adult PT Treatment/Exercise - 05/11/21 0001       Lumbar Exercises: Stretches   Passive Hamstring Stretch Right;Left;2 reps;20 seconds    Passive Hamstring Stretch Limitations standing at step    Other Lumbar Stretch Exercise 2nd step hip flexor stretch with UE raise 5x each side      Lumbar Exercises: Aerobic   Nustep L5 x8 min with PT present to discuss status.   410 steps     Lumbar Exercises: Standing   Other Standing Lumbar Exercises 2 7# weights with modified dead lifts x12.  Lumbar Exercises: Seated   Other Seated Lumbar Exercises 7# chair sit ups 10x      Electrical Stimulation   Electrical Stimulation Location bil lumbar multifidi and left glute    Electrical Stimulation Action pre-mod1.5 ma 8 min    Electrical Stimulation Parameters with DN    Electrical Stimulation Goals Pain      Manual Therapy   Manual Therapy Soft tissue mobilization;Myofascial release    Soft tissue mobilization bil lumbar paraspinals and gluteals              Trigger Point Dry Needling - 05/11/21 0001     Consent Given? Yes    Muscles Treated Back/Hip Gluteus minimus;Gluteus medius;Gluteus maximus;Piriformis    Dry Needling Comments left only glutes; bil multifidi   Electrical Stimulation Performed with Dry Needling Yes    E-stim with Dry Needling Details Bil multifidi    Gluteus Minimus Response Palpable increased muscle length    Gluteus Medius Response Palpable  increased muscle length    Gluteus Maximus Response Palpable increased muscle length    Piriformis Response Palpable increased muscle length    Lumbar multifidi Response Palpable increased muscle length;Twitch response elicited              Moist heat 4 min to lumbar following DN       PT Short Term Goals - 04/27/21 1242       PT SHORT TERM GOAL #1   Title Pt will be independent with his initial HEP to improve flexibility and understanding of proper body mechanics.    Status Achieved               PT Long Term Goals - 05/07/21 1058       PT LONG TERM GOAL #1   Title Pt will have atleast 4/5 MMT of BLE which will improve his efficiency with walking and other ADLs.    Status On-going      PT LONG TERM GOAL #2   Title Pt will report atleast 75% improvement in his back pain and function from the start of PT which will improve his ability to participate in work related tasks.    Status On-going      PT LONG TERM GOAL #3   Title Pt will be able to complete 5x sit to stand with min to mod UE assist in less than 20 sec to reflect improvements in his trunk/LE strength.    Status On-going                   Plan - 05/11/21 1122     Clinical Impression Statement The patient reports the best week he's had since July.  He was able to successfully perform a more comprehensive ex program last visit without an exacerbation of pain.  He reports no incontinence issues this week so far.   He discussed with his doctor's office regarding pelvic floor referral and was told to "continue with what he was doing now." Today he is able continue with lumbo/pelvic/hip strengthening.  He is able to perform good hip hinging without any verbal cues needed for technique.  Good response to DN with ES to address myofascial restriction.    Personal Factors and Comorbidities Age;Comorbidity 1;Time since onset of injury/illness/exacerbation    Comorbidities surgery 10/19/20     Examination-Activity Limitations Stand;Lift;Bed Mobility;Transfers    Stability/Clinical Decision Making Evolving/Moderate complexity    Rehab Potential Good    PT Frequency 2x / week  PT Duration 6 weeks    PT Treatment/Interventions ADLs/Self Care Home Management;Cryotherapy;Moist Heat;Neuromuscular re-education;Balance training;Therapeutic exercise;Therapeutic activities;Functional mobility training;Patient/family education;Gait training;Manual techniques;Dry needling;Passive range of motion;Taping    PT Next Visit Plan progress strengthening, core stability, modalities and manual therapy, DN as needed (may be able to dec frequency of DN)    PT Home Exercise Plan Access Code: [place]             Patient will benefit from skilled therapeutic intervention in order to improve the following deficits and impairments:  Abnormal gait, Decreased range of motion, Increased muscle spasms, Decreased activity tolerance, Pain, Impaired flexibility, Hypomobility, Improper body mechanics, Postural dysfunction, Decreased mobility, Decreased strength  Visit Diagnosis: Low back pain, unspecified back pain laterality, unspecified chronicity, unspecified whether sciatica present  Muscle spasm of back  Muscle weakness (generalized)     Problem List Patient Active Problem List   Diagnosis Date Noted   Spondylolisthesis of lumbosacral region 10/19/2020   Chronic tension-type headache, not intractable 06/23/2020   Hyperlipidemia    GERD (gastroesophageal reflux disease)    Excessive daytime sleepiness 01/12/2018   Seasonal and perennial allergic rhinitis 01/12/2018   CAD (coronary artery disease) 03/25/2014   Pain of left calf 03/25/2014   Morbid obesity ([place]) 03/09/2014   Midsternal chest pain 03/09/2014   Unstable angina ([place]) 03/08/2014   Hypothyroidism 03/08/2014   Chest pain    OSA (obstructive sleep apnea) 07/27/2012   Ruben Im, PT 05/11/21 11:39 AM Phone: [phone] Fax:  [phone]  Alvera Singh, PT 05/11/2021, 11:38 AM  [facility] @ [address] [place], [place], [address] Phone: [phone]   Fax:  [phone]  Name: Griffon Herberg MRN: [id number] Date of Birth: 1960/11/21

## 2021-05-14 ENCOUNTER — Encounter: Payer: Self-pay | Admitting: Rehabilitative and Restorative Service Providers"

## 2021-05-14 ENCOUNTER — Ambulatory Visit: Payer: BC Managed Care – PPO | Admitting: Rehabilitative and Restorative Service Providers"

## 2021-05-14 ENCOUNTER — Other Ambulatory Visit: Payer: Self-pay

## 2021-05-14 DIAGNOSIS — M6281 Muscle weakness (generalized): Secondary | ICD-10-CM

## 2021-05-14 DIAGNOSIS — M62830 Muscle spasm of back: Secondary | ICD-10-CM

## 2021-05-14 DIAGNOSIS — M545 Low back pain, unspecified: Secondary | ICD-10-CM

## 2021-05-14 NOTE — Therapy (Signed)
[place] @ [place], [place], [address] Phone: [phone]   Fax:  [phone]  Physical Therapy Treatment  Patient Details  Name: Jerry Keller MRN: [id number] Date of Birth: March 27, 1961 Referring Provider (PT): Bradly Bienenstock, NP   Encounter Date: 05/14/2021   PT End of Session - 05/14/21 1051     Visit Number 19    Date for PT Re-Evaluation 06/22/21    Authorization Type BCBS    Authorization Time Period 04/21/21 - 06/22/21    PT Start Time 1015    PT Stop Time 1055    PT Time Calculation (min) 40 min    Activity Tolerance Patient tolerated treatment well    Behavior During Therapy [facility] for tasks assessed/performed             Past Medical History:  Diagnosis Date   Anxiety    GERD (gastroesophageal reflux disease)    Headache, cluster    Hyperlipidemia    a. Mild - statin started 02/2014.   Hypothyroidism    Non-obstructive CAD    a. 02/2014 Cath: LM nl, LAD 63m/d, LCX nl, RCA 20p/m, EF 55-60%-->Med Rx.   Palpitations    none recently   Sleep apnea    uses cpap    Past Surgical History:  Procedure Laterality Date   COLONOSCOPY N/A 07/30/2013   Procedure: COLONOSCOPY;  Surgeon: Beryle Beams, MD;  Location: [facility];  Service: Endoscopy;  Laterality: N/A;   LEFT HEART CATH AND CORONARY ANGIOGRAPHY N/A 06/23/2019   Procedure: LEFT HEART CATH AND CORONARY ANGIOGRAPHY;  Surgeon: [name], MD;  Location: [facility];  Service: Cardiovascular;  Laterality: N/A;   LEFT HEART CATHETERIZATION WITH CORONARY ANGIOGRAM N/A 03/08/2014   Procedure: LEFT HEART CATHETERIZATION WITH CORONARY ANGIOGRAM;  Surgeon: Burnell Blanks, MD;  Location: [facility] CATH LAB;  Service: Cardiovascular;  Laterality: N/A;   TONSILLECTOMY AND ADENOIDECTOMY     as a child    There were no vitals filed for this visit.   Subjective Assessment - 05/14/21 1050     Subjective Pt reports that he continues to  feel better, states soreness but denies pain. Pt denies incontinence.    Pertinent History anxiety, GERD, L heart cath    Patient Stated Goals improve sleep, improve pain to allow him to return to work    Currently in Pain? No/denies                [facility] PT Assessment - 05/14/21 0001       Observation/Other Assessments   Other Surveys  Oswestry Disability Index    Oswestry Disability Index  Modified Oswestry Low Back Pain Disability Questionnaire: 16 / 50 = 32.0 %                           [facility] Adult PT Treatment/Exercise - 05/14/21 0001       Lumbar Exercises: Stretches   Passive Hamstring Stretch Right;Left;2 reps;20 seconds    Passive Hamstring Stretch Limitations standing at step    Hip Flexor Stretch Right;Left    Hip Flexor Stretch Limitations one foot on step with UE reach x10 B    Pelvic Tilt 10 reps    Pelvic Tilt Limitations 4 way sitting on dynadisc    Piriformis Stretch Right;Left;2 reps;20 seconds      Lumbar Exercises: Aerobic   Nustep L5 x8 min with PT present to discuss status.  528 steps     Lumbar Exercises: Standing   Wall Slides 20 reps;1 second    Wall Slides Limitations with cuing for TrA contraction and pelvic floor contraction    Other Standing Lumbar Exercises 2 7# weights with modified dead lifts x12.      Lumbar Exercises: Seated   Other Seated Lumbar Exercises 7# snatch and press overhead 2 sets of 5                       PT Short Term Goals - 04/27/21 1242       PT SHORT TERM GOAL #1   Title Pt will be independent with his initial HEP to improve flexibility and understanding of proper body mechanics.    Status Achieved               PT Long Term Goals - 05/14/21 1107       PT LONG TERM GOAL #6   Title Pt to decrease Modified Oswestry Index to 20% or less to demonstrate decreased functional back pain.    Status On-going   32%                  Plan - 05/14/21 1103     Clinical  Impression Statement Jerry Keller continues to report that he is feeling better and and is not having any increased pain or pinching during this ther ex routines.  Pt continues to deny any incontinence issues since last visit, but does report compliance with HEP and states that he is being more vigilant about timed voiding. Pt educated that Viona Gilmore, NP recommended that if pt was still having issues with incontinence to follow up with a Urologist, and she entered a referral for patient, notified pt of this. Pt able to self correct his posture during hip hinging when he starts to perform a squat instead.  He states that he is feeling better with not dry needling each session.    Personal Factors and Comorbidities Age;Comorbidity 1;Time since onset of injury/illness/exacerbation    Comorbidities surgery 10/19/20    PT Treatment/Interventions ADLs/Self Care Home Management;Cryotherapy;Moist Heat;Neuromuscular re-education;Balance training;Therapeutic exercise;Therapeutic activities;Functional mobility training;Patient/family education;Gait training;Manual techniques;Dry needling;Passive range of motion;Taping    PT Next Visit Plan progress strengthening, core stability, modalities and manual therapy, DN as needed (may be able to dec frequency of DN)    Consulted and Agree with Plan of Care Patient             Patient will benefit from skilled therapeutic intervention in order to improve the following deficits and impairments:  Abnormal gait, Decreased range of motion, Increased muscle spasms, Decreased activity tolerance, Pain, Impaired flexibility, Hypomobility, Improper body mechanics, Postural dysfunction, Decreased mobility, Decreased strength  Visit Diagnosis: Low back pain, unspecified back pain laterality, unspecified chronicity, unspecified whether sciatica present  Muscle spasm of back  Muscle weakness (generalized)     Problem List Patient Active Problem List   Diagnosis Date  Noted   Spondylolisthesis of lumbosacral region 10/19/2020   Chronic tension-type headache, not intractable 06/23/2020   Hyperlipidemia    GERD (gastroesophageal reflux disease)    Excessive daytime sleepiness 01/12/2018   Seasonal and perennial allergic rhinitis 01/12/2018   CAD (coronary artery disease) 03/25/2014   Pain of left calf 03/25/2014   Morbid obesity ([place]) 03/09/2014   Midsternal chest pain 03/09/2014   Unstable angina ([place]) 03/08/2014   Hypothyroidism 03/08/2014   Chest pain    OSA (obstructive sleep  apnea) 07/27/2012    Juel Burrow, PT, DPT 05/14/2021, 11:09 AM  [facility] @ [place], [place], [address] Phone: [phone]   Fax:  [phone]  Name: Jerry Keller MRN: [id number] Date of Birth: [birth year]

## 2021-05-18 ENCOUNTER — Other Ambulatory Visit: Payer: Self-pay

## 2021-05-18 ENCOUNTER — Ambulatory Visit: Payer: BC Managed Care – PPO | Attending: Student | Admitting: Physical Therapy

## 2021-05-18 DIAGNOSIS — M545 Low back pain, unspecified: Secondary | ICD-10-CM

## 2021-05-18 DIAGNOSIS — M6281 Muscle weakness (generalized): Secondary | ICD-10-CM | POA: Diagnosis present

## 2021-05-18 DIAGNOSIS — M62830 Muscle spasm of back: Secondary | ICD-10-CM | POA: Diagnosis present

## 2021-05-18 NOTE — Therapy (Signed)
[facility] @ [place], [place], [address] Phone: [phone]   Fax:  [phone]  Physical Therapy Treatment  Patient Details  Name: Jerry Keller MRN: [id number] Date of Birth: Mar 14, 1961 Referring Provider (PT): [place], NP   Encounter Date: 05/18/2021   PT End of Session - 05/18/21 1021     Visit Number 20    Date for PT Re-Evaluation 06/22/21    Authorization Type BCBS    Authorization Time Period 04/21/21 - 06/22/21    PT Start Time 1022    PT Stop Time 1108    PT Time Calculation (min) 46 min    Activity Tolerance Patient tolerated treatment well             Past Medical History:  Diagnosis Date   Anxiety    GERD (gastroesophageal reflux disease)    Headache, cluster    Hyperlipidemia    a. Mild - statin started 02/2014.   Hypothyroidism    Non-obstructive CAD    a. 02/2014 Cath: LM nl, LAD 82m/d, LCX nl, RCA 20p/m, EF 55-60%-->Med Rx.   Palpitations    none recently   Sleep apnea    uses cpap    Past Surgical History:  Procedure Laterality Date   COLONOSCOPY N/A 07/30/2013   Procedure: COLONOSCOPY;  Surgeon: Beryle Beams, MD;  Location: [facility];  Service: Endoscopy;  Laterality: N/A;   LEFT HEART CATH AND CORONARY ANGIOGRAPHY N/A 06/23/2019   Procedure: LEFT HEART CATH AND CORONARY ANGIOGRAPHY;  Surgeon: [name], MD;  Location: [facility];  Service: Cardiovascular;  Laterality: N/A;   LEFT HEART CATHETERIZATION WITH CORONARY ANGIOGRAM N/A 03/08/2014   Procedure: LEFT HEART CATHETERIZATION WITH CORONARY ANGIOGRAM;  Surgeon: Burnell Blanks, MD;  Location: [facility] CATH LAB;  Service: Cardiovascular;  Laterality: N/A;   TONSILLECTOMY AND ADENOIDECTOMY     as a child    There were no vitals filed for this visit.   Subjective Assessment - 05/18/21 1022     Subjective I had a couple of episodes when I got stopped in my tracks.  Was walking at the time.   The hot  spot in right inner thigh and big toe on left side came back.   They want me to go to work training in [place] in mid Feb for the week.  Going to see urologist this Thursday.  This is the 17th day with less incontinence.    Pertinent History anxiety, GERD, L heart cath    Limitations Lifting;House hold activities    Patient Stated Goals improve sleep, improve pain to allow him to return to work    Currently in Pain? Yes    Pain Score 2     Pain Location Back    Pain Orientation Lower    Pain Type Chronic pain    Pain Radiating Towards right inner thigh and left big toe                               [facility] Adult PT Treatment/Exercise - 05/18/21 0001       Lumbar Exercises: Stretches   Other Lumbar Stretch Exercise 2nd step HS stretch right/left    Other Lumbar Stretch Exercise 2nd step hip flexor stretch with arm raise      Lumbar Exercises: Aerobic   Nustep L1 7 min while discussing status      Lumbar Exercises: Machines  for Strengthening   Other Lumbar Machine Exercise standing lat bar 40# 15x      Lumbar Exercises: Standing   Wall Slides 10 reps    Wall Slides Limitations with cuing for TrA contraction and pelvic floor contraction      Lumbar Exercises: Seated   Other Seated Lumbar Exercises holding 2 8# weights chair sit ups 10x      Electrical Stimulation   Electrical Stimulation Location bil lumbar multifidi and left glute    Electrical Stimulation Action pre mod 1.5 ma 8 min    Electrical Stimulation Parameters with DN 8 min    Electrical Stimulation Goals Pain      Manual Therapy   Soft tissue mobilization bil lumbar paraspinals and gluteals              Trigger Point Dry Needling - 05/18/21 0001     Consent Given? Yes    Dry Needling Comments bil    Electrical Stimulation Performed with Dry Needling Yes    E-stim with Dry Needling Details Right L2-3, left L5    Lumbar multifidi Response Palpable increased muscle length;Twitch response  elicited                     PT Short Term Goals - 04/27/21 1242       PT SHORT TERM GOAL #1   Title Pt will be independent with his initial HEP to improve flexibility and understanding of proper body mechanics.    Status Achieved               PT Long Term Goals - 05/14/21 1107       PT LONG TERM GOAL #6   Title Pt to decrease Modified Oswestry Index to 20% or less to demonstrate decreased functional back pain.    Status On-going   32%                  Plan - 05/18/21 1106     Clinical Impression Statement After 2 good weeks, the patient is discouraged that he 2 episodes of bil LE symptoms in the last few days.  Limited right neural mobility noted with step stretch today.  He is able to continue with strengthening trunk and LEs despite this increase in symptoms.   He likes the challenge of wall slides and incorporating pelvic floor contractions with this.   He feels DN/ES would be especially helpful today.  Applied ES to right L2 and left L5 regions in align with peripheral symptom location.  Therapist closely monitoring response during all interventions.    Personal Factors and Comorbidities Age;Comorbidity 1;Time since onset of injury/illness/exacerbation    Comorbidities surgery 10/19/20    Examination-Activity Limitations Stand;Lift;Bed Mobility;Transfers    Stability/Clinical Decision Making Evolving/Moderate complexity    Rehab Potential Good    PT Frequency 2x / week    PT Duration 6 weeks    PT Treatment/Interventions ADLs/Self Care Home Management;Cryotherapy;Moist Heat;Neuromuscular re-education;Balance training;Therapeutic exercise;Therapeutic activities;Functional mobility training;Patient/family education;Gait training;Manual techniques;Dry needling;Passive range of motion;Taping    PT Next Visit Plan progress strengthening, core stability, modalities and manual therapy, DN/ES as needed; sees urologist Thursday    PT Home Exercise Plan Access  Code: [place]             Patient will benefit from skilled therapeutic intervention in order to improve the following deficits and impairments:  Abnormal gait, Decreased range of motion, Increased muscle spasms, Decreased activity tolerance, Pain, Impaired flexibility, Hypomobility, Improper  body mechanics, Postural dysfunction, Decreased mobility, Decreased strength  Visit Diagnosis: Low back pain, unspecified back pain laterality, unspecified chronicity, unspecified whether sciatica present  Muscle spasm of back  Muscle weakness (generalized)     Problem List Patient Active Problem List   Diagnosis Date Noted   Spondylolisthesis of lumbosacral region 10/19/2020   Chronic tension-type headache, not intractable 06/23/2020   Hyperlipidemia    GERD (gastroesophageal reflux disease)    Excessive daytime sleepiness 01/12/2018   Seasonal and perennial allergic rhinitis 01/12/2018   CAD (coronary artery disease) 03/25/2014   Pain of left calf 03/25/2014   Morbid obesity ([place]) 03/09/2014   Midsternal chest pain 03/09/2014   Unstable angina ([place]) 03/08/2014   Hypothyroidism 03/08/2014   Chest pain    OSA (obstructive sleep apnea) 07/27/2012   Ruben Im, PT 05/18/21 11:23 AM Phone: [phone] Fax: [phone]  Alvera Singh, PT 05/18/2021, 11:22 AM  [facility] @ [place], [place], [address] Phone: [phone]   Fax:  [phone]  Name: Jerry Keller MRN: [id number] Date of Birth: [birth year]

## 2021-05-21 ENCOUNTER — Encounter: Payer: Self-pay | Admitting: Rehabilitative and Restorative Service Providers"

## 2021-05-21 ENCOUNTER — Other Ambulatory Visit: Payer: Self-pay

## 2021-05-21 ENCOUNTER — Ambulatory Visit: Payer: BC Managed Care – PPO | Admitting: Rehabilitative and Restorative Service Providers"

## 2021-05-21 DIAGNOSIS — M6281 Muscle weakness (generalized): Secondary | ICD-10-CM

## 2021-05-21 DIAGNOSIS — M545 Low back pain, unspecified: Secondary | ICD-10-CM

## 2021-05-21 DIAGNOSIS — M62830 Muscle spasm of back: Secondary | ICD-10-CM

## 2021-05-21 NOTE — Therapy (Signed)
[facility] @ [place], [place], [address] Phone: [phone]   Fax:  [phone]  Physical Therapy Treatment  Patient Details  Name: Jerry Keller MRN: [id number] Date of Birth: 11-02-1960 Referring Provider (PT): Bradly Bienenstock, NP   Encounter Date: 05/21/2021   PT End of Session - 05/21/21 1013     Visit Number 21    Date for PT Re-Evaluation 06/22/21    Authorization Type BCBS    Authorization Time Period 04/21/21 - 06/22/21    PT Start Time 1011    PT Stop Time 1055    PT Time Calculation (min) 44 min    Activity Tolerance Patient tolerated treatment well    Behavior During Therapy [facility] for tasks assessed/performed             Past Medical History:  Diagnosis Date   Anxiety    GERD (gastroesophageal reflux disease)    Headache, cluster    Hyperlipidemia    a. Mild - statin started 02/2014.   Hypothyroidism    Non-obstructive CAD    a. 02/2014 Cath: LM nl, LAD 71m/d, LCX nl, RCA 20p/m, EF 55-60%-->Med Rx.   Palpitations    none recently   Sleep apnea    uses cpap    Past Surgical History:  Procedure Laterality Date   COLONOSCOPY N/A 07/30/2013   Procedure: COLONOSCOPY;  Surgeon: Beryle Beams, MD;  Location: [facility];  Service: Endoscopy;  Laterality: N/A;   LEFT HEART CATH AND CORONARY ANGIOGRAPHY N/A 06/23/2019   Procedure: LEFT HEART CATH AND CORONARY ANGIOGRAPHY;  Surgeon: [name], MD;  Location: [facility];  Service: Cardiovascular;  Laterality: N/A;   LEFT HEART CATHETERIZATION WITH CORONARY ANGIOGRAM N/A 03/08/2014   Procedure: LEFT HEART CATHETERIZATION WITH CORONARY ANGIOGRAM;  Surgeon: Burnell Blanks, MD;  Location: [facility] CATH LAB;  Service: Cardiovascular;  Laterality: N/A;   TONSILLECTOMY AND ADENOIDECTOMY     as a child    There were no vitals filed for this visit.   Subjective Assessment - 05/21/21 1014     Subjective Pt reports that he is still having  some pinching in his left side and a 'hot spot' on his right side.  He states that after sitting to watch his son pitch, he was only able to sit on bleacher seat for 25 min.    Pertinent History anxiety, GERD, L heart cath    Patient Stated Goals improve sleep, improve pain to allow him to return to work    Currently in Pain? Yes    Pain Score 1     Pain Location Back    Pain Orientation Left;Lower    Pain Descriptors / Indicators Other (Comment)   pinching   Pain Type Chronic pain                               [facility] Adult PT Treatment/Exercise - 05/21/21 0001       Lumbar Exercises: Stretches   Passive Hamstring Stretch Right;Left;2 reps;20 seconds    Passive Hamstring Stretch Limitations standing at step    Hip Flexor Stretch Right;Left    Hip Flexor Stretch Limitations one foot on step with UE reach x10 B    Pelvic Tilt 10 reps    Pelvic Tilt Limitations 4 way sitting on dynadisc    Piriformis Stretch Right;Left;2 reps;20 seconds    Piriformis Stretch Limitations seated  Lumbar Exercises: Aerobic   Nustep L5 x 6 min with PT present to discuss progress.   385 steps     Lumbar Exercises: Machines for Strengthening   Leg Press 100# 2x10, seat at 8    Other Lumbar Machine Exercise standing lat bar and rows 40# 2x10 each      Lumbar Exercises: Standing   Wall Slides 20 reps;1 second    Wall Slides Limitations with cuing for TrA contraction and pelvic floor contraction    Other Standing Lumbar Exercises Alt LE toe tap standing on blue foam to 2nd step x10 B.    Other Standing Lumbar Exercises Hip hiking from 2" step x10B                       PT Short Term Goals - 04/27/21 1242       PT SHORT TERM GOAL #1   Title Pt will be independent with his initial HEP to improve flexibility and understanding of proper body mechanics.    Status Achieved               PT Long Term Goals - 05/14/21 1107       PT LONG TERM GOAL #6   Title Pt  to decrease Modified Oswestry Index to 20% or less to demonstrate decreased functional back pain.    Status On-going   32%                  Plan - 05/21/21 1106     Clinical Impression Statement Mr Eschmann continues to make progress towards goal related activities.  He continues to deny having any boughts of incontinence.  Pt progressed hip strengthening today, as well as progressed on weight machines. Pt continues to progress with increased core stability and continues to require skilled PT to further progress him towards goal related activiites.    Personal Factors and Comorbidities Age;Comorbidity 1;Time since onset of injury/illness/exacerbation    Comorbidities surgery 10/19/20    PT Treatment/Interventions ADLs/Self Care Home Management;Cryotherapy;Moist Heat;Neuromuscular re-education;Balance training;Therapeutic exercise;Therapeutic activities;Functional mobility training;Patient/family education;Gait training;Manual techniques;Dry needling;Passive range of motion;Taping    PT Next Visit Plan progress strengthening, core stability, modalities and manual therapy, DN/ES as needed; sees urologist Thursday    Consulted and Agree with Plan of Care Patient             Patient will benefit from skilled therapeutic intervention in order to improve the following deficits and impairments:  Abnormal gait, Decreased range of motion, Increased muscle spasms, Decreased activity tolerance, Pain, Impaired flexibility, Hypomobility, Improper body mechanics, Postural dysfunction, Decreased mobility, Decreased strength  Visit Diagnosis: Low back pain, unspecified back pain laterality, unspecified chronicity, unspecified whether sciatica present  Muscle spasm of back  Muscle weakness (generalized)     Problem List Patient Active Problem List   Diagnosis Date Noted   Spondylolisthesis of lumbosacral region 10/19/2020   Chronic tension-type headache, not intractable 06/23/2020    Hyperlipidemia    GERD (gastroesophageal reflux disease)    Excessive daytime sleepiness 01/12/2018   Seasonal and perennial allergic rhinitis 01/12/2018   CAD (coronary artery disease) 03/25/2014   Pain of left calf 03/25/2014   Morbid obesity ([place]) 03/09/2014   Midsternal chest pain 03/09/2014   Unstable angina ([place]) 03/08/2014   Hypothyroidism 03/08/2014   Chest pain    OSA (obstructive sleep apnea) 07/27/2012    Juel Burrow, PT, DPT 05/21/2021, 11:11 AM  [facility] @  [place], [place], [address] Phone: [phone]   Fax:  [phone]  Name: Jerry Keller MRN: [id number] Date of Birth: 03/29/1961

## 2021-05-25 ENCOUNTER — Ambulatory Visit: Payer: BC Managed Care – PPO | Admitting: Physical Therapy

## 2021-05-25 ENCOUNTER — Other Ambulatory Visit: Payer: Self-pay

## 2021-05-25 DIAGNOSIS — M545 Low back pain, unspecified: Secondary | ICD-10-CM | POA: Diagnosis not present

## 2021-05-25 DIAGNOSIS — M62830 Muscle spasm of back: Secondary | ICD-10-CM

## 2021-05-25 DIAGNOSIS — M6281 Muscle weakness (generalized): Secondary | ICD-10-CM

## 2021-05-25 NOTE — Therapy (Signed)
[facility] @ [address] [place], [place], [address] Phone: [phone]   Fax:  [phone]  Physical Therapy Treatment  Patient Details  Name: Jerry Keller MRN: [id number] Date of Birth: February 16, 1961 Referring Provider (PT): [place], NP   Encounter Date: 05/25/2021   PT End of Session - 05/25/21 1031     Visit Number 22    Date for PT Re-Evaluation 06/22/21    Authorization Type BCBS    Authorization Time Period 04/21/21 - 06/22/21    PT Start Time 1015    PT Stop Time 1105    PT Time Calculation (min) 50 min    Activity Tolerance Patient tolerated treatment well             Past Medical History:  Diagnosis Date   Anxiety    GERD (gastroesophageal reflux disease)    Headache, cluster    Hyperlipidemia    a. Mild - statin started 02/2014.   Hypothyroidism    Non-obstructive CAD    a. 02/2014 Cath: LM nl, LAD 74m/d, LCX nl, RCA 20p/m, EF 55-60%-->Med Rx.   Palpitations    none recently   Sleep apnea    uses cpap    Past Surgical History:  Procedure Laterality Date   COLONOSCOPY N/A 07/30/2013   Procedure: COLONOSCOPY;  Surgeon: Beryle Beams, MD;  Location: [facility];  Service: Endoscopy;  Laterality: N/A;   LEFT HEART CATH AND CORONARY ANGIOGRAPHY N/A 06/23/2019   Procedure: LEFT HEART CATH AND CORONARY ANGIOGRAPHY;  Surgeon: [name], MD;  Location: [facility];  Service: Cardiovascular;  Laterality: N/A;   LEFT HEART CATHETERIZATION WITH CORONARY ANGIOGRAM N/A 03/08/2014   Procedure: LEFT HEART CATHETERIZATION WITH CORONARY ANGIOGRAM;  Surgeon: Burnell Blanks, MD;  Location: [facility] CATH [facility];  Service: Cardiovascular;  Laterality: N/A;   TONSILLECTOMY AND ADENOIDECTOMY     as a child    There were no vitals filed for this visit.   Subjective Assessment - 05/25/21 1017     Subjective Saw the urologist yesterday.  The doctor felt I was doing everything I should be doing to help  the problem.  He liked that I was doing with the ex and e-stim.   It has been at least 3 weeks since I had incontinence but yesterday I bent over to give the dog and treat and had a small accident.   Last visit was challenging but in a good way.    Pertinent History anxiety, GERD, L heart cath    Patient Stated Goals improve sleep, improve pain to allow him to return to work    Currently in Pain? Yes    Pain Score 1     Pain Location Back    Pain Type Chronic pain    Pain Radiating Towards right inner thigh and left big toe                               [facility] Adult PT Treatment/Exercise - 05/25/21 0001       Lumbar Exercises: Stretches   Passive Hamstring Stretch --    Passive Hamstring Stretch Limitations --    Hip Flexor Stretch Right;Left    Hip Flexor Stretch Limitations one foot on step with UE reach x10 B    Pelvic Tilt --    Pelvic Tilt Limitations --    Piriformis Stretch Right;Left;2 reps;20 seconds    Piriformis  Stretch Limitations seated      Lumbar Exercises: Aerobic   Nustep L5 x 6 min with PT present to discuss progress.   385 steps     Lumbar Exercises: Machines for Strengthening   Other Lumbar Machine Exercise seated lat bar 40# 20x      Lumbar Exercises: Standing   Wall Slides 20 reps;1 second    Wall Slides Limitations with cuing for TrA contraction and pelvic floor contraction    Other Standing Lumbar Exercises Alt LE toe tap standing on blue foam to 1st step x10 B.    Other Standing Lumbar Exercises Hip hiking from 2" step x10B      Moist Heat Therapy   Number Minutes Moist Heat 5 Minutes    Moist Heat Location Lumbar Spine      Electrical Stimulation   Electrical Stimulation Location bil lumbar multifidi   Electrical Stimulation Action pre mod 1.5 ma    Electrical Stimulation Parameters with DN 8 min    Electrical Stimulation Goals Pain      Manual Therapy   Soft tissue mobilization bil lumbar paraspinals               Trigger Point Dry Needling - 05/25/21 0001     Consent Given? Yes    Dry Needling Comments bil    Electrical Stimulation Performed with Dry Needling Yes    E-stim with Dry Needling Details Right L2-3, left L5    Lumbar multifidi Response Palpable increased muscle length;Twitch response elicited                     PT Short Term Goals - 04/27/21 1242       PT SHORT TERM GOAL #1   Title Pt will be independent with his initial HEP to improve flexibility and understanding of proper body mechanics.    Status Achieved               PT Long Term Goals - 05/14/21 1107       PT LONG TERM GOAL #6   Title Pt to decrease Modified Oswestry Index to 20% or less to demonstrate decreased functional back pain.    Status On-going   32%                  Plan - 05/25/21 1039     Clinical Impression Statement The patient is able to continue with deep core strengthening in standing in addition to QL and lat specific strengthening.  Maintaining balance on foam is quite challenging.  Noted increased paraspinal soft tissue restriction but improved following DN.  He reports continued benefit from DN combined with ES.  Therapist monitoring response during all interventions.    Personal Factors and Comorbidities Age;Comorbidity 1;Time since onset of injury/illness/exacerbation    Comorbidities surgery 10/19/20    Examination-Activity Limitations Stand;Lift;Bed Mobility;Transfers    Stability/Clinical Decision Making Evolving/Moderate complexity    Rehab Potential Good    PT Frequency 2x / week    PT Duration 6 weeks    PT Treatment/Interventions ADLs/Self Care Home Management;Cryotherapy;Moist Heat;Neuromuscular re-education;Balance training;Therapeutic exercise;Therapeutic activities;Functional mobility training;Patient/family education;Gait training;Manual techniques;Dry needling;Passive range of motion;Taping    PT Next Visit Plan progress strengthening, core stability,  modalities and manual therapy, DN/ES as needed    PT Home Exercise Plan Access Code: [place]             Patient will benefit from skilled therapeutic intervention in order to improve the following deficits and  impairments:  Abnormal gait, Decreased range of motion, Increased muscle spasms, Decreased activity tolerance, Pain, Impaired flexibility, Hypomobility, Improper body mechanics, Postural dysfunction, Decreased mobility, Decreased strength  Visit Diagnosis: Low back pain, unspecified back pain laterality, unspecified chronicity, unspecified whether sciatica present  Muscle spasm of back  Muscle weakness (generalized)     Problem List Patient Active Problem List   Diagnosis Date Noted   Spondylolisthesis of lumbosacral region 10/19/2020   Chronic tension-type headache, not intractable 06/23/2020   Hyperlipidemia    GERD (gastroesophageal reflux disease)    Excessive daytime sleepiness 01/12/2018   Seasonal and perennial allergic rhinitis 01/12/2018   CAD (coronary artery disease) 03/25/2014   Pain of left calf 03/25/2014   Morbid obesity ([place]) 03/09/2014   Midsternal chest pain 03/09/2014   Unstable angina ([place]) 03/08/2014   Hypothyroidism 03/08/2014   Chest pain    OSA (obstructive sleep apnea) 07/27/2012   Ruben Im, PT 05/25/21 12:12 PM Phone: [phone] Fax: [phone]  Alvera Singh, PT 05/25/2021, 12:11 PM  [facility] @ [place] [place], [place], [address] Phone: [phone]   Fax:  [phone]  Name: Olyn Landstrom MRN: [id number] Date of Birth: [birth year]

## 2021-05-28 ENCOUNTER — Encounter: Payer: BC Managed Care – PPO | Admitting: Rehabilitative and Restorative Service Providers"

## 2021-06-01 ENCOUNTER — Encounter: Payer: Self-pay | Admitting: Rehabilitative and Restorative Service Providers"

## 2021-06-01 ENCOUNTER — Other Ambulatory Visit: Payer: Self-pay

## 2021-06-01 ENCOUNTER — Ambulatory Visit: Payer: BC Managed Care – PPO | Admitting: Rehabilitative and Restorative Service Providers"

## 2021-06-01 DIAGNOSIS — M6281 Muscle weakness (generalized): Secondary | ICD-10-CM

## 2021-06-01 DIAGNOSIS — M545 Low back pain, unspecified: Secondary | ICD-10-CM

## 2021-06-01 DIAGNOSIS — M62830 Muscle spasm of back: Secondary | ICD-10-CM

## 2021-06-01 NOTE — Therapy (Signed)
[facility] @ [address] [place], [place], [address] Phone: [phone]   Fax:  [phone]  Physical Therapy Treatment  Patient Details  Name: Jerry Keller MRN: [id number] Date of Birth: 04/02/1961 Referring Provider (PT): Bradly Bienenstock, NP   Encounter Date: 06/01/2021   PT End of Session - 06/01/21 1011     Visit Number 23    Date for PT Re-Evaluation 06/22/21    Authorization Type BCBS    Authorization Time Period 04/15/2021 - 04/14/2022    PT Start Time 1010    PT Stop Time 1050    PT Time Calculation (min) 40 min    Activity Tolerance Patient tolerated treatment well    Behavior During Therapy [facility] for tasks assessed/performed             Past Medical History:  Diagnosis Date   Anxiety    GERD (gastroesophageal reflux disease)    Headache, cluster    Hyperlipidemia    a. Mild - statin started 02/2014.   Hypothyroidism    Non-obstructive CAD    a. 02/2014 Cath: LM nl, LAD 9m/d, LCX nl, RCA 20p/m, EF 55-60%-->Med Rx.   Palpitations    none recently   Sleep apnea    uses cpap    Past Surgical History:  Procedure Laterality Date   COLONOSCOPY N/A 07/30/2013   Procedure: COLONOSCOPY;  Surgeon: Beryle Beams, MD;  Location: [facility];  Service: Endoscopy;  Laterality: N/A;   LEFT HEART CATH AND CORONARY ANGIOGRAPHY N/A 06/23/2019   Procedure: LEFT HEART CATH AND CORONARY ANGIOGRAPHY;  Surgeon: [name], MD;  Location: [facility];  Service: Cardiovascular;  Laterality: N/A;   LEFT HEART CATHETERIZATION WITH CORONARY ANGIOGRAM N/A 03/08/2014   Procedure: LEFT HEART CATHETERIZATION WITH CORONARY ANGIOGRAM;  Surgeon: Burnell Blanks, MD;  Location: [facility] CATH LAB;  Service: Cardiovascular;  Laterality: N/A;   TONSILLECTOMY AND ADENOIDECTOMY     as a child    There were no vitals filed for this visit.   Subjective Assessment - 06/01/21 1012     Subjective Pt reports that he was not  feeling well earlier this week and had to cancel, but he is feeling better now.  Pt states that he did his exercises as best he could. Pt reports that he did move something too large and had a small tweak in his back, but is feeling better.    Pertinent History anxiety, GERD, L heart cath    Patient Stated Goals improve sleep, improve pain to allow him to return to work    Currently in Pain? No/denies                               [facility] Adult PT Treatment/Exercise - 06/01/21 0001       Lumbar Exercises: Stretches   Passive Hamstring Stretch Right;Left;2 reps;20 seconds    Passive Hamstring Stretch Limitations standing at step    Hip Flexor Stretch Right;Left;2 reps;20 seconds    Hip Flexor Stretch Limitations one foot on step    Pelvic Tilt Limitations 4 way tilts and CW and CCW pelvic turns on green pball x10 each    Piriformis Stretch Right;Left;2 reps;20 seconds    Piriformis Stretch Limitations seated      Lumbar Exercises: Aerobic   Nustep L5 x 6 min with PT present to discuss progress.      Lumbar Exercises: Machines for  Strengthening   Leg Press 110# 2x10, seat at 8    Other Lumbar Machine Exercise seated lat bar 50# 20x      Lumbar Exercises: Standing   Wall Slides 20 reps;1 second    Wall Slides Limitations with cuing for TrA contraction and pelvic floor contraction    Other Standing Lumbar Exercises Alt LE toe tap standing on blue foam to 2nd step x10 B.    Other Standing Lumbar Exercises Hip hiking from 2" step x10B                       PT Short Term Goals - 04/27/21 1242       PT SHORT TERM GOAL #1   Title Pt will be independent with his initial HEP to improve flexibility and understanding of proper body mechanics.    Status Achieved               PT Long Term Goals - 06/01/21 1149       PT LONG TERM GOAL #1   Title Pt will have atleast 4/5 MMT of BLE which will improve his efficiency with walking and other ADLs.     Status On-going      PT LONG TERM GOAL #2   Title Pt will report atleast 75% improvement in his back pain and function from the start of PT which will improve his ability to participate in work related tasks.    Status On-going      PT LONG TERM GOAL #3   Title Pt will be able to complete 5x sit to stand with min to mod UE assist in less than 20 sec to reflect improvements in his trunk/LE strength.    Status On-going      PT LONG TERM GOAL #4   Title Pt will be able to complete bed mobility such as rolling and supine to/from sit without the need for UE support or increase in low back pain.    Status Achieved      PT LONG TERM GOAL #5   Title The patient will have LE strength needed to rise 1x from a standard height chair with min UE assist    Status Achieved                   Plan - 06/01/21 1147     Clinical Impression Statement Mr Pewitt continues to make great progress towards goal related activities.  Pt missed a visit this week, but was able to perform HEP and able to maintain the gains that he has made and continue to progress. Pt continues with difficulty with maintaining balance on foam, but was able to step up to 2nd step instead of first. Pt requires less frequent cuing for maintaining core stability and pelvic floor contractions during ther ex.  Pt to be out of town next week, so reivewed HEP that he can do while he is gone. Will reassess next visit how pt progressed peforming his independent HEP while out of town.    Personal Factors and Comorbidities Age;Comorbidity 1;Time since onset of injury/illness/exacerbation    Comorbidities surgery 10/19/20    PT Treatment/Interventions ADLs/Self Care Home Management;Cryotherapy;Moist Heat;Neuromuscular re-education;Balance training;Therapeutic exercise;Therapeutic activities;Functional mobility training;Patient/family education;Gait training;Manual techniques;Dry needling;Passive range of motion;Taping    PT Next Visit Plan  progress strengthening, core stability, modalities and manual therapy, DN/ES as needed    PT Home Exercise Plan Access Code: [email or web address]    Consulted and Agree with  Plan of Care Patient             Patient will benefit from skilled therapeutic intervention in order to improve the following deficits and impairments:  Abnormal gait, Decreased range of motion, Increased muscle spasms, Decreased activity tolerance, Pain, Impaired flexibility, Hypomobility, Improper body mechanics, Postural dysfunction, Decreased mobility, Decreased strength  Visit Diagnosis: Low back pain, unspecified back pain laterality, unspecified chronicity, unspecified whether sciatica present  Muscle spasm of back  Muscle weakness (generalized)     Problem List Patient Active Problem List   Diagnosis Date Noted   Spondylolisthesis of lumbosacral region 10/19/2020   Chronic tension-type headache, not intractable 06/23/2020   Hyperlipidemia    GERD (gastroesophageal reflux disease)    Excessive daytime sleepiness 01/12/2018   Seasonal and perennial allergic rhinitis 01/12/2018   CAD (coronary artery disease) 03/25/2014   Pain of left calf 03/25/2014   Morbid obesity ([place]) 03/09/2014   Midsternal chest pain 03/09/2014   Unstable angina ([place]) 03/08/2014   Hypothyroidism 03/08/2014   Chest pain    OSA (obstructive sleep apnea) 07/27/2012    Juel Burrow, PT, DPT 06/01/2021, 11:51 AM  [facility] @ [address] [place], [place], [address] Phone: [phone]   Fax:  [phone]  Name: Jerry Keller MRN: [id number] Date of Birth: 10-20-1960

## 2021-06-04 ENCOUNTER — Encounter: Payer: BC Managed Care – PPO | Admitting: Rehabilitative and Restorative Service Providers"

## 2021-06-08 ENCOUNTER — Encounter: Payer: BC Managed Care – PPO | Admitting: Physical Therapy

## 2021-06-11 ENCOUNTER — Ambulatory Visit: Payer: BC Managed Care – PPO | Admitting: Rehabilitative and Restorative Service Providers"

## 2021-06-11 ENCOUNTER — Other Ambulatory Visit: Payer: Self-pay

## 2021-06-11 ENCOUNTER — Encounter: Payer: Self-pay | Admitting: Rehabilitative and Restorative Service Providers"

## 2021-06-11 DIAGNOSIS — M545 Low back pain, unspecified: Secondary | ICD-10-CM | POA: Diagnosis not present

## 2021-06-11 DIAGNOSIS — M62830 Muscle spasm of back: Secondary | ICD-10-CM

## 2021-06-11 DIAGNOSIS — M6281 Muscle weakness (generalized): Secondary | ICD-10-CM

## 2021-06-11 NOTE — Therapy (Signed)
[facility] @ [place], [place], [address] Phone: [phone]   Fax:  [phone]  Physical Therapy Treatment  Patient Details  Name: Jerry Keller MRN: [id number] Date of Birth: March 28, 1961 Referring Provider (PT): Bradly Bienenstock, NP   Encounter Date: 06/11/2021   PT End of Session - 06/11/21 1019     Visit Number 24    Date for PT Re-Evaluation 06/22/21    Authorization Type BCBS    Authorization Time Period 04/15/2021 - 04/14/2022    PT Start Time 1015    PT Stop Time 1059    PT Time Calculation (min) 44 min    Activity Tolerance Patient tolerated treatment well    Behavior During Therapy [facility] for tasks assessed/performed             Past Medical History:  Diagnosis Date   Anxiety    GERD (gastroesophageal reflux disease)    Headache, cluster    Hyperlipidemia    a. Mild - statin started 02/2014.   Hypothyroidism    Non-obstructive CAD    a. 02/2014 Cath: LM nl, LAD 40m/d, LCX nl, RCA 20p/m, EF 55-60%-->Med Rx.   Palpitations    none recently   Sleep apnea    uses cpap    Past Surgical History:  Procedure Laterality Date   COLONOSCOPY N/A 07/30/2013   Procedure: COLONOSCOPY;  Surgeon: Beryle Beams, MD;  Location: [facility];  Service: Endoscopy;  Laterality: N/A;   LEFT HEART CATH AND CORONARY ANGIOGRAPHY N/A 06/23/2019   Procedure: LEFT HEART CATH AND CORONARY ANGIOGRAPHY;  Surgeon: [name], MD;  Location: [facility];  Service: Cardiovascular;  Laterality: N/A;   LEFT HEART CATHETERIZATION WITH CORONARY ANGIOGRAM N/A 03/08/2014   Procedure: LEFT HEART CATHETERIZATION WITH CORONARY ANGIOGRAM;  Surgeon: Burnell Blanks, MD;  Location: [facility] CATH LAB;  Service: Cardiovascular;  Laterality: N/A;   TONSILLECTOMY AND ADENOIDECTOMY     as a child    There were no vitals filed for this visit.   Subjective Assessment - 06/11/21 1021     Subjective Pt reports that his hotel  bed was uncomfortable and he had increased pain in his back and 3 incontinence episodes while he was out of town.    Pertinent History anxiety, GERD, L heart cath    Patient Stated Goals improve sleep, improve pain to allow him to return to work    Currently in Pain? Yes    Pain Score 3     Pain Location Back    Pain Orientation Lower    Pain Descriptors / Indicators Tingling;Other (Comment)   pinching   Pain Type Chronic pain                               [facility] Adult PT Treatment/Exercise - 06/11/21 0001       Transfers   Five time sit to stand comments  18.9 sec with UE on thighs      Lumbar Exercises: Stretches   Passive Hamstring Stretch Right;Left;2 reps;20 seconds    Passive Hamstring Stretch Limitations standing at step    Hip Flexor Stretch Right;Left;2 reps;20 seconds    Hip Flexor Stretch Limitations one foot on step    Pelvic Tilt Limitations 4 way tilts and CW and CCW pelvic turns on green pball x10 each    Piriformis Stretch Right;Left;2 reps;20 seconds    Piriformis Stretch Limitations seated  Lumbar Exercises: Aerobic   Nustep L5 x 6 min with PT present to discuss progress.   473 steps     Lumbar Exercises: Machines for Strengthening   Other Lumbar Machine Exercise seated lat bar 40# 2x10, rows 30# 2x10      Lumbar Exercises: Standing   Wall Slides 20 reps;1 second    Wall Slides Limitations with cuing for TrA contraction and pelvic floor contraction    Other Standing Lumbar Exercises Alt LE toe tap standing on blue foam to 2nd step x10 B.    Other Standing Lumbar Exercises Hip hiking from 2" step x10B                       PT Short Term Goals - 04/27/21 1242       PT SHORT TERM GOAL #1   Title Pt will be independent with his initial HEP to improve flexibility and understanding of proper body mechanics.    Status Achieved               PT Long Term Goals - 06/01/21 1149       PT LONG TERM GOAL #1   Title Pt  will have atleast 4/5 MMT of BLE which will improve his efficiency with walking and other ADLs.    Status On-going      PT LONG TERM GOAL #2   Title Pt will report atleast 75% improvement in his back pain and function from the start of PT which will improve his ability to participate in work related tasks.    Status On-going      PT LONG TERM GOAL #3   Title Pt will be able to complete 5x sit to stand with min to mod UE assist in less than 20 sec to reflect improvements in his trunk/LE strength.    Status On-going      PT LONG TERM GOAL #4   Title Pt will be able to complete bed mobility such as rolling and supine to/from sit without the need for UE support or increase in low back pain.    Status Achieved      PT LONG TERM GOAL #5   Title The patient will have LE strength needed to rise 1x from a standard height chair with min UE assist    Status Achieved                   Plan - 06/11/21 1056     Clinical Impression Statement Mr Tullis is progressing towards goal related activities.  He was having some increased pain after missing PT last week and sleeping on a bed that was too soft.  Pt did well in session today, though did have some increased soreness/fatigue compared to last visit with ther ex.  Pt with improved time noted on 5 times sit to/from stand.  Pt continues to require skilled PT to progress towards goal related activities.    PT Treatment/Interventions ADLs/Self Care Home Management;Cryotherapy;Moist Heat;Neuromuscular re-education;Balance training;Therapeutic exercise;Therapeutic activities;Functional mobility training;Patient/family education;Gait training;Manual techniques;Dry needling;Passive range of motion;Taping    PT Next Visit Plan progress strengthening, core stability, modalities and manual therapy, DN/ES as needed    Consulted and Agree with Plan of Care Patient             Patient will benefit from skilled therapeutic intervention in order to  improve the following deficits and impairments:  Abnormal gait, Decreased range of motion, Increased muscle spasms, Decreased activity  tolerance, Pain, Impaired flexibility, Hypomobility, Improper body mechanics, Postural dysfunction, Decreased mobility, Decreased strength  Visit Diagnosis: Low back pain, unspecified back pain laterality, unspecified chronicity, unspecified whether sciatica present  Muscle spasm of back  Muscle weakness (generalized)     Problem List Patient Active Problem List   Diagnosis Date Noted   Spondylolisthesis of lumbosacral region 10/19/2020   Chronic tension-type headache, not intractable 06/23/2020   Hyperlipidemia    GERD (gastroesophageal reflux disease)    Excessive daytime sleepiness 01/12/2018   Seasonal and perennial allergic rhinitis 01/12/2018   CAD (coronary artery disease) 03/25/2014   Pain of left calf 03/25/2014   Morbid obesity ([place]) 03/09/2014   Midsternal chest pain 03/09/2014   Unstable angina ([place]) 03/08/2014   Hypothyroidism 03/08/2014   Chest pain    OSA (obstructive sleep apnea) 07/27/2012    Juel Burrow, PT 06/11/2021, 10:59 AM  [facility] @ [place], [place], [address] Phone: [phone]   Fax:  [phone]  Name: Caine Barfield MRN: [id number] Date of Birth: 07/08/1960

## 2021-06-15 ENCOUNTER — Ambulatory Visit: Payer: BC Managed Care – PPO | Attending: Student | Admitting: Physical Therapy

## 2021-06-15 ENCOUNTER — Other Ambulatory Visit: Payer: Self-pay

## 2021-06-15 DIAGNOSIS — M62830 Muscle spasm of back: Secondary | ICD-10-CM

## 2021-06-15 DIAGNOSIS — M545 Low back pain, unspecified: Secondary | ICD-10-CM | POA: Diagnosis not present

## 2021-06-15 DIAGNOSIS — M6281 Muscle weakness (generalized): Secondary | ICD-10-CM | POA: Diagnosis present

## 2021-06-15 NOTE — Therapy (Signed)
[place] ?[facility] ?[place], [place], [address] ?Phone: [phone]   Fax:  [phone] ? ?Physical Therapy Treatment ? ?Patient Details  ?Name: Jerry Keller ?MRN: [id number] ?Date of Birth: 09/21/1960 ?Referring Provider (PT): [facility], NP ? ? ?Encounter Date: 06/15/2021 ? ? PT End of Session - 06/15/21 1017   ? ? Visit Number 25   ? Date for PT Re-Evaluation 06/22/21   ? Authorization Type BCBS   ? Authorization Time Period 04/15/2021 - 04/14/2022   ? PT Start Time 1016   ? PT Stop Time 1100   ? PT Time Calculation (min) 44 min   ? Activity Tolerance Patient tolerated treatment well   ? ?  ?  ? ?  ? ? ?Past Medical History:  ?Diagnosis Date  ? Anxiety   ? GERD (gastroesophageal reflux disease)   ? Headache, cluster   ? Hyperlipidemia   ? a. Mild - statin started 02/2014.  ? Hypothyroidism   ? Non-obstructive CAD   ? a. 02/2014 Cath: LM nl, LAD 8m/d, LCX nl, RCA 20p/m, EF 55-60%-->Med Rx.  ? Palpitations   ? none recently  ? Sleep apnea   ? uses cpap  ? ? ?Past Surgical History:  ?Procedure Laterality Date  ? COLONOSCOPY N/A 07/30/2013  ? Procedure: COLONOSCOPY;  Surgeon: Beryle Beams, MD;  Location: [facility];  Service: Endoscopy;  Laterality: N/A;  ? LEFT HEART CATH AND CORONARY ANGIOGRAPHY N/A 06/23/2019  ? Procedure: LEFT HEART CATH AND CORONARY ANGIOGRAPHY;  Surgeon: [name], MD;  Location: [facility];  Service: Cardiovascular;  Laterality: N/A;  ? LEFT HEART CATHETERIZATION WITH CORONARY ANGIOGRAM N/A 03/08/2014  ? Procedure: LEFT HEART CATHETERIZATION WITH CORONARY ANGIOGRAM;  Surgeon: Burnell Blanks, MD;  Location: [facility] CATH LAB;  Service: Cardiovascular;  Laterality: N/A;  ? TONSILLECTOMY AND ADENOIDECTOMY    ? as a child  ? ? ?There were no vitals filed for this visit. ? ? Subjective Assessment - 06/15/21 1018   ? ? Subjective I'm still recovering from that week out of town.  I had 3 accidents last week, less this  week.  Now I can't get rid of that right LE/toe pain 8/10 yesteday.  Left side 3/10.  I think the DN/ES would help today.  Now it hurts to do a pelvic tilt and do the wall slides.   ? Patient Stated Goals improve sleep, improve pain to allow him to return to work   ? Currently in Pain? Yes   ? Pain Score 8    ? Pain Location Back   ? Pain Orientation Right;Left   right worse  ? Pain Type Chronic pain   ? ?  ?  ? ?  ? ? ? ? ? ? ? ? ? ? ? ? ? ? ? ? ? ? ? ? [place] Adult PT Treatment/Exercise - 06/15/21 0001   ? ?  ? Lumbar Exercises: Stretches  ? Passive Hamstring Stretch Right;Left;2 reps;20 seconds   ? Passive Hamstring Stretch Limitations standing at step   ? Hip Flexor Stretch Right;Left;2 reps;20 seconds   ? Hip Flexor Stretch Limitations one foot on step   ?  ? Lumbar Exercises: Aerobic  ? Nustep L5 x 6 min with PT present to discuss progress.   385 steps  ?  ? Lumbar Exercises: Machines for Strengthening  ? Other Lumbar Machine Exercise standing lat bar 40# 2x10   ? Other Lumbar Machine Exercise  Matrix seated row 40# 2x10   ?  ? Lumbar Exercises: Standing  ? Other Standing Lumbar Exercises --   ? Other Standing Lumbar Exercises --   ?  ? Electrical Stimulation  ? Electrical Stimulation Location right lumbar multifidi, right glute and HS region   ? Electrical Stimulation Action with DN pre mod 2.0   ? Electrical Stimulation Parameters with DN 15 min   ? Electrical Stimulation Goals Pain   ?  ? Manual Therapy  ? Soft tissue mobilization bil lumbar paraspinals and gluteals   ? ?  ?  ? ?  ? ? ? Trigger Point Dry Needling - 06/15/21 0001   ? ? Consent Given? Yes   ? Dry Needling Comments bil   ? Electrical Stimulation Performed with Dry Needling Yes   ? E-stim with Dry Needling Details right lumbar, glute, HS L5-S1 neural pattern   ? Gluteus Minimus Response Twitch response elicited;Palpable increased muscle length   ? Gluteus Medius Response Twitch response elicited;Palpable increased muscle length   ? Gluteus Maximus  Response Twitch response elicited;Palpable increased muscle length   ? Lumbar multifidi Response Palpable increased muscle length;Twitch response elicited   ? ?  ?  ? ?  ? ? ? ? ? ? ? ? ? ? PT Short Term Goals - 04/27/21 1242   ? ?  ? PT SHORT TERM GOAL #1  ? Title Pt will be independent with his initial HEP to improve flexibility and understanding of proper body mechanics.   ? Status Achieved   ? ?  ?  ? ?  ? ? ? ? PT Long Term Goals - 06/01/21 1149   ? ?  ? PT LONG TERM GOAL #1  ? Title Pt will have atleast 4/5 MMT of BLE which will improve his efficiency with walking and other ADLs.   ? Status On-going   ?  ? PT LONG TERM GOAL #2  ? Title Pt will report atleast 75% improvement in his back pain and function from the start of PT which will improve his ability to participate in work related tasks.   ? Status On-going   ?  ? PT LONG TERM GOAL #3  ? Title Pt will be able to complete 5x sit to stand with min to mod UE assist in less than 20 sec to reflect improvements in his trunk/LE strength.   ? Status On-going   ?  ? PT LONG TERM GOAL #4  ? Title Pt will be able to complete bed mobility such as rolling and supine to/from sit without the need for UE support or increase in low back pain.   ? Status Achieved   ?  ? PT LONG TERM GOAL #5  ? Title The patient will have LE strength needed to rise 1x from a standard height chair with min UE assist   ? Status Achieved   ? ?  ?  ? ?  ? ? ? ? ? ? ? ? Plan - 06/15/21 1038   ? ? Clinical Impression Statement The patient had a set back following travel and reports increased incontinence and right peripheral symptoms.  He has responded well to DN combined with ES and applied today in respective dermatomal pattern.  Therapist monitoring response and modifying as needed.   ? Personal Factors and Comorbidities Age;Comorbidity 1;Time since onset of injury/illness/exacerbation   ? Comorbidities surgery 10/19/20   ? Examination-Activity Limitations Stand;Lift;Bed Mobility;Transfers   ?  Rehab Potential Good   ?  PT Frequency 2x / week   ? PT Duration 6 weeks   ? PT Treatment/Interventions ADLs/Self Care Home Management;Cryotherapy;Moist Heat;Neuromuscular re-education;Balance training;Therapeutic exercise;Therapeutic activities;Functional mobility training;Patient/family education;Gait training;Manual techniques;Dry needling;Passive range of motion;Taping   ? PT Next Visit Plan progress strengthening, core stability, modalities and manual therapy, DN/ES as needed   ? PT Home Exercise Plan Access Code: [email or web address]   ? ?  ?  ? ?  ? ? ?Patient will benefit from skilled therapeutic intervention in order to improve the following deficits and impairments:  Abnormal gait, Decreased range of motion, Increased muscle spasms, Decreased activity tolerance, Pain, Impaired flexibility, Hypomobility, Improper body mechanics, Postural dysfunction, Decreased mobility, Decreased strength ? ?Visit Diagnosis: ?Low back pain, unspecified back pain laterality, unspecified chronicity, unspecified whether sciatica present ? ?Muscle spasm of back ? ?Muscle weakness (generalized) ? ? ? ? ?Problem List ?Patient Active Problem List  ? Diagnosis Date Noted  ? Spondylolisthesis of lumbosacral region 10/19/2020  ? Chronic tension-type headache, not intractable 06/23/2020  ? Hyperlipidemia   ? GERD (gastroesophageal reflux disease)   ? Excessive daytime sleepiness 01/12/2018  ? Seasonal and perennial allergic rhinitis 01/12/2018  ? CAD (coronary artery disease) 03/25/2014  ? Pain of left calf 03/25/2014  ? Morbid obesity ([place]) 03/09/2014  ? Midsternal chest pain 03/09/2014  ? Unstable angina ([place]) 03/08/2014  ? Hypothyroidism 03/08/2014  ? Chest pain   ? OSA (obstructive sleep apnea) 07/27/2012  ? ?Ruben Im, PT ?06/15/21 12:14 PM ?Phone: [phone] ?Fax: [phone]  ?Alvera Singh, PT ?06/15/2021, 12:13 PM ? ?[place] ?[facility] [place] ?[place], [place],  [address] ?Phone: [phone]   Fax:  [phone] ? ?Name: Jerry Keller ?MRN: [id number] ?Date of Birth: 25-Mar-1961 ? ? ? ?

## 2021-06-18 ENCOUNTER — Other Ambulatory Visit: Payer: Self-pay

## 2021-06-18 ENCOUNTER — Ambulatory Visit: Payer: BC Managed Care – PPO | Admitting: Rehabilitative and Restorative Service Providers"

## 2021-06-18 ENCOUNTER — Encounter: Payer: Self-pay | Admitting: Rehabilitative and Restorative Service Providers"

## 2021-06-18 DIAGNOSIS — M62830 Muscle spasm of back: Secondary | ICD-10-CM

## 2021-06-18 DIAGNOSIS — M545 Low back pain, unspecified: Secondary | ICD-10-CM | POA: Diagnosis not present

## 2021-06-18 DIAGNOSIS — M6281 Muscle weakness (generalized): Secondary | ICD-10-CM

## 2021-06-18 NOTE — Therapy (Signed)
[place] ?[facility] [place] ?[place], [place], [address] ?Phone: [phone]   Fax:  [phone] ? ?Physical Therapy Treatment ? ?Patient Details  ?Name: Jerry Keller ?MRN: [id number] ?Date of Birth: [birth year] ?Referring Provider (PT): [facility], NP ? ? ?Encounter Date: 06/18/2021 ? ? PT End of Session - 06/18/21 1018   ? ? Visit Number 26   ? Date for PT Re-Evaluation 06/22/21   ? Authorization Type BCBS   ? Authorization Time Period 04/15/2021 - 04/14/2022   ? PT Start Time 1015   ? PT Stop Time 1050   ? PT Time Calculation (min) 35 min   ? Activity Tolerance Patient tolerated treatment well   ? Behavior During Therapy [facility] for tasks assessed/performed   ? ?  ?  ? ?  ? ? ?Past Medical History:  ?Diagnosis Date  ? Anxiety   ? GERD (gastroesophageal reflux disease)   ? Headache, cluster   ? Hyperlipidemia   ? a. Mild - statin started 02/2014.  ? Hypothyroidism   ? Non-obstructive CAD   ? a. 02/2014 Cath: LM nl, LAD 60md, LCX nl, RCA 20p/m, EF 55-60%-->Med Rx.  ? Palpitations   ? none recently  ? Sleep apnea   ? uses cpap  ? ? ?Past Surgical History:  ?Procedure Laterality Date  ? COLONOSCOPY N/A 07/30/2013  ? Procedure: COLONOSCOPY;  Surgeon: PBeryle Beams MD;  Location: [facility];  Service: Endoscopy;  Laterality: N/A;  ? LEFT HEART CATH AND CORONARY ANGIOGRAPHY N/A 06/23/2019  ? Procedure: LEFT HEART CATH AND CORONARY ANGIOGRAPHY;  Surgeon: CSherren Mocha MD;  [facility];  Service: Cardiovascular;  Laterality: N/A;  ? LEFT HEART CATHETERIZATION WITH CORONARY ANGIOGRAM N/A 03/08/2014  ? Procedure: LEFT HEART CATHETERIZATION WITH CORONARY ANGIOGRAM;  Surgeon: CBurnell Blanks MD;  [facility];  Service: Cardiovascular;  Laterality: N/A;  ? TONSILLECTOMY AND ADENOIDECTOMY    ? as a child  ? ? ?There were no vitals filed for this visit. ? ? Subjective Assessment - 06/18/21 1019   ? ? Subjective Pt reports that he needs to  leave early today to make it to his appointment with Dr JArnoldo Moraletoday at 11:15 am.   ? Pertinent History anxiety, GERD, L heart cath   ? Patient Stated Goals improve sleep, improve pain to allow him to return to work   ? Currently in Pain? Yes   ? Pain Score 4    ? Pain Location Buttocks   low back to gluts  ? Pain Orientation Left;Right   ? Pain Descriptors / Indicators Spasm   ? Pain Type Chronic pain   ? ?  ?  ? ?  ? ? ? ? ? ? ? ? ? ? ? ? ? ? ? ? ? ? ? ? [facility] PT Treatment/Exercise - 06/18/21 0001   ? ?  ? Lumbar Exercises: Stretches  ? Passive Hamstring Stretch Right;Left;2 reps;20 seconds   ? Passive Hamstring Stretch Limitations standing at step   ? Hip Flexor Stretch Right;Left;2 reps;20 seconds   ? Hip Flexor Stretch Limitations one foot on step   ? Pelvic Tilt Limitations 4 way tilts and CW and CCW pelvic turns on green pball x10 each   ? Piriformis Stretch Right;Left;2 reps;20 seconds   ? Piriformis Stretch Limitations seated   ?  ? Lumbar Exercises: Aerobic  ? Nustep L5 x 6 min with PT present to discuss progress.   ?  ?  Lumbar Exercises: Machines for Strengthening  ? Other Lumbar Machine Exercise Seated lat bar 40# 2x15, rows 30# 2x10   ?  ? Lumbar Exercises: Standing  ? Wall Slides 20 reps;1 second   ? Wall Slides Limitations with cuing for TrA contraction and pelvic floor contraction   ? ?  ?  ? ?  ? ? ? ? ? ? ? ? ? ? ? ? PT Short Term Goals - 04/27/21 1242   ? ?  ? PT SHORT TERM GOAL #1  ? Title Pt will be independent with his initial HEP to improve flexibility and understanding of proper body mechanics.   ? Status Achieved   ? ?  ?  ? ?  ? ? ? ? PT Long Term Goals - 06/18/21 1100   ? ?  ? PT LONG TERM GOAL #2  ? Title Pt will report atleast 75% improvement in his back pain and function from the start of PT which will improve his ability to participate in work related tasks.   ? Status Partially Met   ? ?  ?  ? ?  ? ? ? ? ? ? ? ? Plan - 06/18/21 1049   ? ? Clinical Impression Statement Pt reports  feeling overall 60-70% better since initial evaluation; he states that he was feelng better prior to his week long trip where he was sleeping on a mattress that was too soft and sitting in chairs for 10 hours a day.  Pt with less pain than reported on last visit, but does have some pain still over his lower lumbar and gluteal region.  Pt continues to require skilled PT to progress towards goal related activities.   ? Personal Factors and Comorbidities Age;Comorbidity 1;Time since onset of injury/illness/exacerbation   ? Comorbidities surgery 10/19/20   ? PT Treatment/Interventions ADLs/Self Care Home Management;Cryotherapy;Moist Heat;Neuromuscular re-education;Balance training;Therapeutic exercise;Therapeutic activities;Functional mobility training;Patient/family education;Gait training;Manual techniques;Dry needling;Passive range of motion;Taping   ? PT Next Visit Plan progress strengthening, core stability, modalities and manual therapy, DN/ES as needed   ? Consulted and Agree with Plan of Care Patient   ? ?  ?  ? ?  ? ? ?Patient will benefit from skilled therapeutic intervention in order to improve the following deficits and impairments:  Abnormal gait, Decreased range of motion, Increased muscle spasms, Decreased activity tolerance, Pain, Impaired flexibility, Hypomobility, Improper body mechanics, Postural dysfunction, Decreased mobility, Decreased strength ? ?Visit Diagnosis: ?Low back pain, unspecified back pain laterality, unspecified chronicity, unspecified whether sciatica present ? ?Muscle spasm of back ? ?Muscle weakness (generalized) ? ? ? ? ?Problem List ?Patient Active Problem List  ? Diagnosis Date Noted  ? Spondylolisthesis of lumbosacral region 10/19/2020  ? Chronic tension-type headache, not intractable 06/23/2020  ? Hyperlipidemia   ? GERD (gastroesophageal reflux disease)   ? Excessive daytime sleepiness 01/12/2018  ? Seasonal and perennial allergic rhinitis 01/12/2018  ? CAD (coronary artery  disease) 03/25/2014  ? Pain of left calf 03/25/2014  ? Morbid obesity ([place]) 03/09/2014  ? Midsternal chest pain 03/09/2014  ? Unstable angina ([place]) 03/08/2014  ? Hypothyroidism 03/08/2014  ? Chest pain   ? OSA (obstructive sleep apnea) 07/27/2012  ? ? ?Shelby Dubin Delina Kruczek, PT ?06/18/2021, 11:02 AM ? ?[facility] ?[facility] [place] ?[place], [place], [address] ?Phone: [phone]   Fax:  [phone] ? ?Name: Jerry Keller ?MRN: [id number] ?Date of Birth: 1960-10-25 ? ? ? ?

## 2021-06-20 ENCOUNTER — Other Ambulatory Visit (HOSPITAL_BASED_OUTPATIENT_CLINIC_OR_DEPARTMENT_OTHER): Payer: Self-pay

## 2021-06-20 MED ORDER — HYDROCODONE-ACETAMINOPHEN 5-325 MG PO TABS
ORAL_TABLET | ORAL | 0 refills | Status: DC
  Filled 2021-06-20: qty 60, 20d supply, fill #0

## 2021-06-22 ENCOUNTER — Other Ambulatory Visit: Payer: Self-pay

## 2021-06-22 ENCOUNTER — Ambulatory Visit: Payer: BC Managed Care – PPO | Admitting: Physical Therapy

## 2021-06-22 DIAGNOSIS — M545 Low back pain, unspecified: Secondary | ICD-10-CM

## 2021-06-22 DIAGNOSIS — M62830 Muscle spasm of back: Secondary | ICD-10-CM

## 2021-06-22 DIAGNOSIS — M6281 Muscle weakness (generalized): Secondary | ICD-10-CM

## 2021-06-22 NOTE — Therapy (Signed)
[facility] @ [place], [place], [address] Phone: [phone]   Fax:  [phone]  Physical Therapy Treatment/Recertification  Patient Details  Name: Jerry Keller MRN: [id number] Date of Birth: [birth year] Referring Provider (PT): [facility], NP   Encounter Date: 06/22/2021   PT End of Session - 06/22/21 1013     Visit Number 27    Date for PT Re-Evaluation 08/17/21    Authorization Type BCBS    Authorization Time Period 04/15/2021 - 04/14/2022    PT Start Time 1015    PT Stop Time 1110    PT Time Calculation (min) 55 min    Activity Tolerance Patient tolerated treatment well             Past Medical History:  Diagnosis Date   Anxiety    GERD (gastroesophageal reflux disease)    Headache, cluster    Hyperlipidemia    a. Mild - statin started 02/2014.   Hypothyroidism    Non-obstructive CAD    a. 02/2014 Cath: LM nl, LAD 39md, LCX nl, RCA 20p/m, EF 55-60%-->Med Rx.   Palpitations    none recently   Sleep apnea    uses cpap    Past Surgical History:  Procedure Laterality Date   COLONOSCOPY N/A 07/30/2013   Procedure: COLONOSCOPY;  Surgeon: PBeryle Beams MD;  [facility]: [facility];  Service: Endoscopy;  Laterality: N/A;   LEFT HEART CATH AND CORONARY ANGIOGRAPHY N/A 06/23/2019   Procedure: LEFT HEART CATH AND CORONARY ANGIOGRAPHY;  Surgeon: CSherren Mocha MD;  [facility];  Service: Cardiovascular;  Laterality: N/A;   LEFT HEART CATHETERIZATION WITH CORONARY ANGIOGRAM N/A 03/08/2014   Procedure: LEFT HEART CATHETERIZATION WITH CORONARY ANGIOGRAM;  Surgeon: CBurnell Blanks MD;  [facility];  Service: Cardiovascular;  Laterality: N/A;   TONSILLECTOMY AND ADENOIDECTOMY     as a child    There were no vitals filed for this visit.   Subjective Assessment - 06/22/21 1015     Subjective I'm a little better than I was last time.  I was about 80% better until  that trip and that knocked me back to 60%.    Had appt with Dr. JArnoldo Moraleoffice.  They still don't  understand the bathroom thing.  This week 3 accidents, last week 4 accidents.  Urology appt this afternoon.    Pertinent History anxiety, GERD, L heart cath    Patient Stated Goals improve sleep, improve pain to allow him to return to work    Currently in Pain? Yes    Pain Score 4    7/10 earlier   Pain Location Buttocks    Pain Orientation Right;Left   left > right   Pain Type Chronic pain                [facility] PT Assessment - 06/22/21 0001       Assessment   Medical Diagnosis s/p arthrodesis L5/S1    Referring Provider (PT) MTrinda PascalSummer, NP    Onset Date/Surgical Date 10/19/20      Observation/Other Assessments   Oswestry Disability Index  Modified Oswestry Low Back Pain Disability Questionnaire: 22/ 50 = 44.0 %      Strength   Overall Strength Comments now able to rise without UE use with hips higher than knees (2 cushions); from 90 degrees knee flexion needs UE use mod    Right Hip Flexion 4-/5   3-5 Reps tolerated of  SLR on each side   Right Hip ABduction 4-/5    Left Hip Flexion 4-/5    Left Hip ABduction 4-/5    Lumbar Flexion 4-/5    Lumbar Extension 4-/5      Standardized Balance Assessment   Five times sit to stand comments  18   UE assist on thighs                          [facility] Adult PT Treatment/Exercise - 06/22/21 0001       Lumbar Exercises: Stretches   Passive Hamstring Stretch Right;Left;2 reps;20 seconds    Passive Hamstring Stretch Limitations standing at step    Hip Flexor Stretch Right;Left;2 reps;20 seconds    Hip Flexor Stretch Limitations one foot on step      Lumbar Exercises: Aerobic   Nustep L5 x 6 min with PT present to discuss progress.      Lumbar Exercises: Machines for Strengthening   Other Lumbar Machine Exercise Seated lat bar 40# 2x15, rows 30# 2x10      Electrical Stimulation   Electrical Stimulation Location  bil lumbar multifidi, bil glute and HS region    Electrical Stimulation Action with DN pre mod 15 min 2.0    Electrical Stimulation Parameters 1.5 ma 15 min    Electrical Stimulation Goals Pain      Manual Therapy   Soft tissue mobilization bil lumbar paraspinals and gluteals              Trigger Point Dry Needling - 06/22/21 0001     Consent Given? Yes    Dry Needling Comments bil    Electrical Stimulation Performed with Dry Needling Yes    E-stim with Dry Needling Details bil lumbar,bil glute, HS L5-S1 neural pattern    Gluteus Minimus Response Twitch response elicited;Palpable increased muscle length    Gluteus Medius Response Twitch response elicited;Palpable increased muscle length    Gluteus Maximus Response Twitch response elicited;Palpable increased muscle length    Lumbar multifidi Response Palpable increased muscle length;Twitch response elicited                     PT Short Term Goals - 04/27/21 1242       PT SHORT TERM GOAL #1   Title Pt will be independent with his initial HEP to improve flexibility and understanding of proper body mechanics.    Status Achieved               PT Long Term Goals - 06/22/21 1048       PT LONG TERM GOAL #1   Title Pt will have atleast 4/5 MMT of BLE which will improve his efficiency with walking and other ADLs.    Time 8    Period Weeks    Status On-going    Target Date 08/17/21      PT LONG TERM GOAL #2   Title Pt will report atleast 80% improvement in his back pain and function from the start of PT which will improve his ability to participate in work related tasks.    Time 8    Period Weeks    Status Partially Met      PT LONG TERM GOAL #3   Title Pt will be able to complete 5x sit to stand with min to mod UE assist in less than 20 sec to reflect improvements in his trunk/LE strength.    Status Achieved  PT LONG TERM GOAL #4   Title Pt will be able to complete bed mobility such as rolling and  supine to/from sit without the need for UE support or increase in low back pain.    Status Achieved      PT LONG TERM GOAL #5   Title The patient will have LE strength needed to rise 1x from a standard height chair with min UE assist    Status Achieved      Additional Long Term Goals   Additional Long Term Goals Yes      PT LONG TERM GOAL #6   Title Pt to decrease Modified Oswestry Index to 20% or less to demonstrate decreased functional back pain.    Time 8    Period Weeks    Status On-going      PT LONG TERM GOAL #7   Title Able to lift 25# object with good form with min pain    Time 8    Period Weeks    Status New                   Plan - 06/22/21 1205     Clinical Impression Statement The patient reports a set back after his travel for work but rates his overall progress at 60-70%.  He is progressing with trunk and LE strength grossly 4-/5.  5x sit to stand test improved by 11 sec since mid January.  He is now able to rise from a higher seat height (hips higher than knees) without UE use.  Per Oswestry he has a moderate self perceived limitation with a score of 44%.  He continues to be limited in walking and standing tolerance and avoids lifting.  He would benefit from a continuation of skilled PT for a progression of strengthening to improve function at work and home.  He is also benefiting from DN combined with ES for pain modulation.    Personal Factors and Comorbidities Age;Comorbidity 1;Time since onset of injury/illness/exacerbation    Comorbidities surgery 10/19/20    Examination-Activity Limitations Stand;Lift;Bed Mobility;Transfers    Examination-Participation Restrictions Occupation;Yard Work    [employer] Evolving/Moderate complexity    Rehab Potential Good    PT Frequency 2x / week    PT Duration 6 weeks    PT Treatment/Interventions ADLs/Self Care Home Management;Cryotherapy;Moist Heat;Neuromuscular re-education;Balance  training;Therapeutic exercise;Therapeutic activities;Functional mobility training;Patient/family education;Gait training;Manual techniques;Dry needling;Passive range of motion;Taping    PT Next Visit Plan progress strengthening, core stability, modalities and manual therapy, DN/ES as needed    PT Home Exercise Plan Access Code: [email or web address]             Patient will benefit from skilled therapeutic intervention in order to improve the following deficits and impairments:  Abnormal gait, Decreased range of motion, Increased muscle spasms, Decreased activity tolerance, Pain, Impaired flexibility, Hypomobility, Improper body mechanics, Postural dysfunction, Decreased mobility, Decreased strength  Visit Diagnosis: Low back pain, unspecified back pain laterality, unspecified chronicity, unspecified whether sciatica present - Plan: PT plan of care cert/re-cert  Muscle spasm of back - Plan: PT plan of care cert/re-cert  Muscle weakness (generalized) - Plan: PT plan of care cert/re-cert     Problem List Patient Active Problem List   Diagnosis Date Noted   Spondylolisthesis of lumbosacral region 10/19/2020   Chronic tension-type headache, not intractable 06/23/2020   Hyperlipidemia    GERD (gastroesophageal reflux disease)    Excessive daytime sleepiness 01/12/2018   Seasonal and perennial allergic rhinitis 01/12/2018  CAD (coronary artery disease) 03/25/2014   Pain of left calf 03/25/2014   Morbid obesity ([place]) 03/09/2014   Midsternal chest pain 03/09/2014   Unstable angina ([place]) 03/08/2014   Hypothyroidism 03/08/2014   Chest pain    OSA (obstructive sleep apnea) 07/27/2012   Ruben Im, PT 06/22/21 12:16 PM Phone: [phone] Fax: [phone]  Alvera Singh, PT 06/22/2021, 12:16 PM  [facility] @ [place], [place], [address] Phone: [phone]   Fax:  [phone]  Name: Jerry Keller MRN:  [id number] Date of Birth: April 21, 1960

## 2021-06-25 ENCOUNTER — Ambulatory Visit: Payer: BC Managed Care – PPO | Admitting: Rehabilitative and Restorative Service Providers"

## 2021-06-29 ENCOUNTER — Other Ambulatory Visit: Payer: Self-pay

## 2021-06-29 ENCOUNTER — Ambulatory Visit: Payer: BC Managed Care – PPO | Admitting: Physical Therapy

## 2021-06-29 DIAGNOSIS — M545 Low back pain, unspecified: Secondary | ICD-10-CM | POA: Diagnosis not present

## 2021-06-29 DIAGNOSIS — M6281 Muscle weakness (generalized): Secondary | ICD-10-CM

## 2021-06-29 DIAGNOSIS — M62830 Muscle spasm of back: Secondary | ICD-10-CM

## 2021-06-29 NOTE — Therapy (Signed)
[place] [facility] ?[place], [place], [address] ?Phone: [phone]   Fax:  [phone] ? ?Physical Therapy Treatment ? ?Patient Details  ?Name: Jerry Keller ?MRN: [id number] ?Date of Birth: 30-Oct-1960 ?Referring Provider (PT): [facility], NP ? ? ?Encounter Date: 06/29/2021 ? ? PT End of Session - 06/29/21 1013   ? ? Visit Number 28   ? Date for PT Re-Evaluation 08/17/21   ? Authorization Type BCBS   ? Authorization Time Period 04/15/2021 - 04/14/2022   ? PT Start Time 1015   ? PT Stop Time 1110   ? PT Time Calculation (min) 55 min   ? Activity Tolerance Patient tolerated treatment well   ? ?  ?  ? ?  ? ? ?Past Medical History:  ?Diagnosis Date  ? Anxiety   ? GERD (gastroesophageal reflux disease)   ? Headache, cluster   ? Hyperlipidemia   ? a. Mild - statin started 02/2014.  ? Hypothyroidism   ? Non-obstructive CAD   ? a. 02/2014 Cath: LM nl, LAD 82md, LCX nl, RCA 20p/m, EF 55-60%-->Med Rx.  ? Palpitations   ? none recently  ? Sleep apnea   ? uses cpap  ? ? ?Past Surgical History:  ?Procedure Laterality Date  ? COLONOSCOPY N/A 07/30/2013  ? Procedure: COLONOSCOPY;  Surgeon: PBeryle Beams MD;  Location: [facility];  Service: Endoscopy;  Laterality: N/A;  ? LEFT HEART CATH AND CORONARY ANGIOGRAPHY N/A 06/23/2019  ? Procedure: LEFT HEART CATH AND CORONARY ANGIOGRAPHY;  Surgeon: CSherren Mocha MD;  [facility];  Service: Cardiovascular;  Laterality: N/A;  ? LEFT HEART CATHETERIZATION WITH CORONARY ANGIOGRAM N/A 03/08/2014  ? Procedure: LEFT HEART CATHETERIZATION WITH CORONARY ANGIOGRAM;  Surgeon: CBurnell Blanks MD;  [facility];  Service: Cardiovascular;  Laterality: N/A;  ? TONSILLECTOMY AND ADENOIDECTOMY    ? as a child  ? ? ?There were no vitals filed for this visit. ? ? Subjective Assessment - 06/29/21 1014   ? ? Subjective It's been a rough week.  I can't go like I used to.  Being on the go so much at work,  I've had 3 accidents.  Working long days.  Numbness in my knees, sharpness and pinching in my back.  The stim/DN helped last time.  I took a muscle relaxer before I came.   ? Pertinent History anxiety, GERD, L heart cath;   ? Limitations Lifting;House hold activities   ? Patient Stated Goals improve sleep, improve pain to allow him to return to work   ? Currently in Pain? Yes   ? Pain Score 8    ? Pain Location Back   ? Pain Orientation Right;Left   ? ?  ?  ? ?  ? ? ? ? ? ? ? ? ? ? ? ? ? ? ? ? ? ? ? ? [facility] PT Treatment/Exercise - 06/29/21 0001   ? ?  ? Lumbar Exercises: Aerobic  ? Nustep L5 x 6 min with PT present to discuss progress.   ?  ? Lumbar Exercises: Seated  ? Other Seated Lumbar Exercises Sit Fit disc pelvic tilts 3 min   ? Other Seated Lumbar Exercises neuroscience of pain education; discussed strategies to quiet the CNS including meditation/mindfulness; effect of   ?  ? Modalities  ? Modalities Moist Heat   ?  ? Moist Heat Therapy  ? Number Minutes Moist Heat 5 Minutes   ?  Moist Heat Location Hip   ?  ? Electrical Stimulation  ? Electrical Stimulation Location bil lumbar multifidi, bil glute and HS region   ? Printmaker Action with DN pre mod   ? Electrical Stimulation Parameters 1.5 15 min   ? Electrical Stimulation Goals Pain   ?  ? Manual Therapy  ? Soft tissue mobilization bil lumbar paraspinals and gluteals   ? ?  ?  ? ?  ? ? ? Trigger Point Dry Needling - 06/29/21 0001   ? ? Consent Given? Yes   ? Dry Needling Comments bil   ? Electrical Stimulation Performed with Dry Needling Yes   ? E-stim with Dry Needling Details Right L2-3, left L5   ? Lumbar multifidi Response Palpable increased muscle length;Twitch response elicited   ? ?  ?  ? ?  ? ? ? ? ? ? ? ? ? ? PT Short Term Goals - 04/27/21 1242   ? ?  ? PT SHORT TERM GOAL #1  ? Title Pt will be independent with his initial HEP to improve flexibility and understanding of proper body mechanics.   ? Status Achieved   ? ?  ?  ? ?   ? ? ? ? PT Long Term Goals - 06/22/21 1048   ? ?  ? PT LONG TERM GOAL #1  ? Title Pt will have atleast 4/5 MMT of BLE which will improve his efficiency with walking and other ADLs.   ? Time 8   ? Period Weeks   ? Status On-going   ? Target Date 08/17/21   ?  ? PT LONG TERM GOAL #2  ? Title Pt will report atleast 80% improvement in his back pain and function from the start of PT which will improve his ability to participate in work related tasks.   ? Time 8   ? Period Weeks   ? Status Partially Met   ?  ? PT LONG TERM GOAL #3  ? Title Pt will be able to complete 5x sit to stand with min to mod UE assist in less than 20 sec to reflect improvements in his trunk/LE strength.   ? Status Achieved   ?  ? PT LONG TERM GOAL #4  ? Title Pt will be able to complete bed mobility such as rolling and supine to/from sit without the need for UE support or increase in low back pain.   ? Status Achieved   ?  ? PT LONG TERM GOAL #5  ? Title The patient will have LE strength needed to rise 1x from a standard height chair with min UE assist   ? Status Achieved   ?  ? Additional Long Term Goals  ? Additional Long Term Goals Yes   ?  ? PT LONG TERM GOAL #6  ? Title Pt to decrease Modified Oswestry Index to 20% or less to demonstrate decreased functional back pain.   ? Time 8   ? Period Weeks   ? Status On-going   ?  ? PT LONG TERM GOAL #7  ? Title Able to lift 25# object with good form with min pain   ? Time 8   ? Period Weeks   ? Status New   ? ?  ?  ? ?  ? ? ? ? ? ? ? ? Plan - 06/29/21 1101   ? ? Clinical Impression Statement The patient presents forward flexed and catches with movement causing an appearance of instability.  Suspect central sensitization inhibiting muscular activation.  Discussed neuroscience of pain and how stress can affect physical symptoms and strategies to calm the central nervous system.  He has a positive response from DN/ES and heat in relieving increased pain sensitivity.   Standing more erect after end of  session. Treatment modified based on exacerbation of symptoms.   ? Personal Factors and Comorbidities Age;Comorbidity 1;Time since onset of injury/illness/exacerbation   ? Comorbidities surgery 10/19/20   ? Examination-Activity Limitations Stand;Lift;Bed Mobility;Transfers   ? Rehab Potential Good   ? PT Frequency 2x / week   ? PT Duration 6 weeks   ? PT Treatment/Interventions ADLs/Self Care Home Management;Cryotherapy;Moist Heat;Neuromuscular re-education;Balance training;Therapeutic exercise;Therapeutic activities;Functional mobility training;Patient/family education;Gait training;Manual techniques;Dry needling;Passive range of motion;Taping   ? PT Next Visit Plan resume strengthening, core stability, modalities and manual therapy, DN/ES as needed   ? PT Home Exercise Plan Access Code: [email or web address]   ? ?  ?  ? ?  ? ? ?Patient will benefit from skilled therapeutic intervention in order to improve the following deficits and impairments:  Abnormal gait, Decreased range of motion, Increased muscle spasms, Decreased activity tolerance, Pain, Impaired flexibility, Hypomobility, Improper body mechanics, Postural dysfunction, Decreased mobility, Decreased strength ? ?Visit Diagnosis: ?Low back pain, unspecified back pain laterality, unspecified chronicity, unspecified whether sciatica present ? ?Muscle spasm of back ? ?Muscle weakness (generalized) ? ? ? ? ?Problem List ?Patient Active Problem List  ? Diagnosis Date Noted  ? Spondylolisthesis of lumbosacral region 10/19/2020  ? Chronic tension-type headache, not intractable 06/23/2020  ? Hyperlipidemia   ? GERD (gastroesophageal reflux disease)   ? Excessive daytime sleepiness 01/12/2018  ? Seasonal and perennial allergic rhinitis 01/12/2018  ? CAD (coronary artery disease) 03/25/2014  ? Pain of left calf 03/25/2014  ? Morbid obesity ([place]) 03/09/2014  ? Midsternal chest pain 03/09/2014  ? Unstable angina ([place]) 03/08/2014  ? Hypothyroidism 03/08/2014  ? Chest pain   ? OSA  (obstructive sleep apnea) 07/27/2012  ? ?Ruben Im, PT ?06/29/21 11:32 AM ?Phone: [phone] ?Fax: [phone]  ?Alvera Singh, PT ?06/29/2021, 11:32 AM ? ?[place] ?[facility]

## 2021-07-02 ENCOUNTER — Other Ambulatory Visit: Payer: Self-pay

## 2021-07-02 ENCOUNTER — Encounter: Payer: Self-pay | Admitting: Rehabilitative and Restorative Service Providers"

## 2021-07-02 ENCOUNTER — Ambulatory Visit: Payer: BC Managed Care – PPO | Admitting: Rehabilitative and Restorative Service Providers"

## 2021-07-02 DIAGNOSIS — M62830 Muscle spasm of back: Secondary | ICD-10-CM

## 2021-07-02 DIAGNOSIS — M6281 Muscle weakness (generalized): Secondary | ICD-10-CM

## 2021-07-02 DIAGNOSIS — M545 Low back pain, unspecified: Secondary | ICD-10-CM

## 2021-07-02 NOTE — Therapy (Signed)
?OUTPATIENT PHYSICAL THERAPY THORACOLUMBAR TREATMENT ? ? ?Patient Name: Jerry Keller ?MRN: [id number] ?DOB:01-Jul-1960, 61 y.o., male ?Today's Date: 07/02/2021 ? ? PT End of Session - 07/02/21 1020   ? ? Visit Number 29   ? Date for PT Re-Evaluation 08/17/21   ? Authorization Type BCBS   ? Authorization Time Period 04/15/2021 - 04/14/2022   ? PT Start Time 1015   ? PT Stop Time 1055   ? PT Time Calculation (min) 40 min   ? Activity Tolerance Patient tolerated treatment well   ? Behavior During Therapy [facility] for tasks assessed/performed   ? ?  ?  ? ?  ? ? ?Past Medical History:  ?Diagnosis Date  ? Anxiety   ? GERD (gastroesophageal reflux disease)   ? Headache, cluster   ? Hyperlipidemia   ? a. Mild - statin started 02/2014.  ? Hypothyroidism   ? Non-obstructive CAD   ? a. 02/2014 Cath: LM nl, LAD 84md, LCX nl, RCA 20p/m, EF 55-60%-->Med Rx.  ? Palpitations   ? none recently  ? Sleep apnea   ? uses cpap  ? ?Past Surgical History:  ?Procedure Laterality Date  ? COLONOSCOPY N/A 07/30/2013  ? Procedure: COLONOSCOPY;  Surgeon: PBeryle Beams MD;  [facility]: [facility];  Service: Endoscopy;  Laterality: N/A;  ? LEFT HEART CATH AND CORONARY ANGIOGRAPHY N/A 06/23/2019  ? Procedure: LEFT HEART CATH AND CORONARY ANGIOGRAPHY;  Surgeon: CSherren Mocha MD;  [facility];  Service: Cardiovascular;  Laterality: N/A;  ? LEFT HEART CATHETERIZATION WITH CORONARY ANGIOGRAM N/A 03/08/2014  ? Procedure: LEFT HEART CATHETERIZATION WITH CORONARY ANGIOGRAM;  Surgeon: CBurnell Blanks MD;  [facility];  Service: Cardiovascular;  Laterality: N/A;  ? TONSILLECTOMY AND ADENOIDECTOMY    ? as a child  ? ?Patient Active Problem List  ? Diagnosis Date Noted  ? Spondylolisthesis of lumbosacral region 10/19/2020  ? Chronic tension-type headache, not intractable 06/23/2020  ? Hyperlipidemia   ? GERD (gastroesophageal reflux disease)   ? Excessive daytime sleepiness 01/12/2018  ? Seasonal and perennial allergic  rhinitis 01/12/2018  ? CAD (coronary artery disease) 03/25/2014  ? Pain of left calf 03/25/2014  ? Morbid obesity ([place] 03/09/2014  ? Midsternal chest pain 03/09/2014  ? Unstable angina ([place] 03/08/2014  ? Hypothyroidism 03/08/2014  ? Chest pain   ? OSA (obstructive sleep apnea) 07/27/2012  ? ? ?PCP: WChristain Sacramento MD ? ?REFERRING PROVIDER: BPatricia Nettle NP ? ?REFERRING DIAG: Z98.1 (ICD-10-CM) - Arthrodesis status ? ?THERAPY DIAG:  ?Low back pain, unspecified back pain laterality, unspecified chronicity, unspecified whether sciatica present ? ?Muscle spasm of back ? ?Muscle weakness (generalized) ? ?ONSET DATE: 10/19/2020 ? ?SUBJECTIVE:                                                                                                                                                                                          ? ?  SUBJECTIVE STATEMENT: ?Pt reports that he rested over the weekend and is feeling better.  ? ?PERTINENT HISTORY:  ?Back surgery on 10/19/2020, anxiety, GERD, L heart cath; ? ?PAIN:  ?Are you having pain? Yes: NPRS scale: 3/10 ?Pain location: lumbar ?Pain description: pinching and tightness ?Aggravating factors: lifting and increased ambulation and sitting ?Relieving factors: meditation, stretching ? ? ?PATIENT GOALS:  improve sleep, improve pain to allow him to return to work  ? ? ?OBJECTIVE:  ? ?PATIENT SURVEYS:  ? ?Modified Oswestry 22/50 =44% Disability on 06/22/2021  ?FOTO 47% on 07/02/2021 ? ?SCREENING FOR RED FLAGS: ? ?07/02/2021: ?Bowel or bladder incontinence: Yes: Pt reports has not had any since last week, had 3-4 boughts of incontinence last week. ? ? ?COGNITION: ? Overall cognitive status: Within functional limits for tasks assessed   ?  ? ?LE MMT: ? ?MMT Right ?06/22/2021 Left ?06/22/2021  ?Hip flexion 4- 4-  ?Hip extension    ?Hip abduction 4- 4-  ?Hip adduction    ?Hip internal rotation    ?Hip external rotation    ?Knee flexion    ?Knee extension    ?Lumbar Flexion 4- 4-  ?Lumbar  Extension 4- 4-  ?Ankle inversion    ?Ankle eversion    ? (Blank rows = not tested) ? ?FUNCTIONAL TESTS:  ? ?06/11/2021: ?5 times sit to stand: 18.9 sec with UE on thighs ? ? ?TODAY'S TREATMENT  ? ?07/02/2021: ?Nustep Level 5 x6 min with PT present to discuss status ?Standing with foot on step:  Hamstring and hip flexion stretch 2x20 sec bilat  ?Seated pelvic tilt on dynadisc:  4 way, clockwise and counterclockwise x10 each ?Lat Pull 45# 2x10 reps ?Seated Row 30# 2x10 reps ?Wall Slides 2x10 ?Wall push ups 2x10 ? ? ?PATIENT EDUCATION:  ?Education details: Reviewed HEP and importance of meditation ?Person educated: Patient ?Education method: Explanation ?Education comprehension: verbalized understanding ? ? ?HOME EXERCISE PROGRAM: ?Access Code: [email or web address] ?URL: [email or web address] ?Date: 07/02/2021 ?Prepared by: Juel Burrow ? ?Exercises ?Seated Pelvic Floor Contraction - 1-2 x daily - 7 x weekly - 1 sets - 10 reps ?Supine Transversus Abdominis Bracing - Hands on Stomach - 1 x daily - 7 x weekly - 2-3 sets - 10 reps ?Supine March with Posterior Pelvic Tilt - 1-2 x daily - 7 x weekly - 1 sets - 10 reps ?Hooklying Sequential Leg March and Lower - 1 x daily - 7 x weekly - 2-3 sets - 10 reps ?Hooklying Hamstring Stretch with Strap - 2 x daily - 7 x weekly - 1 sets - 3 reps - 30 sec hold ?Supine Piriformis Stretch with Foot on Ground - 1-2 x daily - 7 x weekly - 1 sets - 2 reps - 20 sec hold ?Supine Bridge - 1-2 x daily - 7 x weekly - 1 sets - 10 reps ?Standing Sports administrator with Table and Chair Support - 2 x daily - 7 x weekly - 1 sets - 3 reps - 30 sec hold ? ? ?ASSESSMENT: ? ?CLINICAL IMPRESSION: ?[name] presents to PT with reporting that he is feeling better than last session. Pt states that dry needling last session really helped him.  Pt able to slowly progress through session today with minimal cuing for technique. ? ? ?OBJECTIVE IMPAIRMENTS Abnormal gait, decreased activity tolerance, decreased  mobility, decreased ROM, decreased strength, hypomobility, increased muscle spasms, impaired flexibility, improper body mechanics, postural dysfunction, and pain.  ? ?ACTIVITY LIMITATIONS cleaning and occupation.  ? ?PERSONAL FACTORS Age, Time since  onset of injury/illness/exacerbation, and 1 comorbidity: surgery 10/19/2020  are also affecting patient's functional outcome.  ? ? ?REHAB POTENTIAL: Good ? ?CLINICAL DECISION MAKING: Evolving/moderate complexity ? ?EVALUATION COMPLEXITY: Moderate ? ? ?GOALS: ?Goals reviewed with patient? Yes ? ?SHORT TERM GOALS: Target date: 02/19/2022 ? ?Pt will be independent with his initial HEP to improve flexibility and understanding of proper body mechanics. ?Baseline:  ?Goal status: MET ? ? ?LONG TERM GOALS: Target date: 08/27/2021 ? ?Pt will have atleast 4/5 MMT of BLE which will improve his efficiency with walking and other ADLs. ?Baseline:  ?Goal status: IN PROGRESS ? ?2.  Pt will report atleast 80% improvement in his back pain and function from the start of PT which will improve his ability to participate in work related tasks. ?Baseline:  ?Goal status: IN PROGRESS ? ?3.  Pt will be able to complete 5x sit to stand with min to mod UE assist in less than 20 sec to reflect improvements in his trunk/LE strength. ?Baseline:  ?Goal status: MET ? ?4.  Pt will be able to complete bed mobility such as rolling and supine to/from sit without the need for UE support or increase in low back pain. ?Baseline:  ?Goal status: MET ? ?5.  The patient will have LE strength needed to rise 1x from a standard height chair with min UE assist. ?Baseline:  ?Goal status: MET ? ?6.  Pt to decrease Modified Oswestry Index to 20% or less to demonstrate decreased functional back pain. ?Baseline:  ?Goal status: IN PROGRESS ?7.  Able to lift 25# object with good form with min pain. ?Baseline: ?Goal status:  IN PROGRESS ? ? ? ?PLAN: ?PT FREQUENCY: 2x/week ? ?PT DURATION: 8 weeks ? ?PLANNED INTERVENTIONS:  Therapeutic exercises, Therapeutic activity, Neuromuscular re-education, Balance training, Gait training, Patient/Family education, Joint mobilization, Dry Needling, Cryotherapy, Moist heat, Taping, and Manual therapy. ? ?PL

## 2021-07-06 ENCOUNTER — Ambulatory Visit: Payer: BC Managed Care – PPO | Admitting: Rehabilitative and Restorative Service Providers"

## 2021-07-06 ENCOUNTER — Other Ambulatory Visit: Payer: Self-pay

## 2021-07-06 ENCOUNTER — Encounter: Payer: Self-pay | Admitting: Rehabilitative and Restorative Service Providers"

## 2021-07-06 DIAGNOSIS — M545 Low back pain, unspecified: Secondary | ICD-10-CM

## 2021-07-06 DIAGNOSIS — M6281 Muscle weakness (generalized): Secondary | ICD-10-CM

## 2021-07-06 DIAGNOSIS — M62830 Muscle spasm of back: Secondary | ICD-10-CM

## 2021-07-06 NOTE — Therapy (Signed)
?OUTPATIENT PHYSICAL THERAPY THORACOLUMBAR TREATMENT ? ? ?Patient Name: Jerry Keller ?MRN: [id number] ?DOB:14-Jun-1960, 61 y.o., male ?Today's Date: 07/06/2021 ? ? PT End of Session - 07/06/21 1008   ? ? Visit Number 30   ? Date for PT Re-Evaluation 08/17/21   ? Authorization Type BCBS   ? PT Start Time 1005   ? PT Stop Time 1047   ? PT Time Calculation (min) 42 min   ? Activity Tolerance Patient tolerated treatment well   ? Behavior During Therapy [facility] for tasks assessed/performed   ? ?  ?  ? ?  ? ? ?Past Medical History:  ?Diagnosis Date  ? Anxiety   ? GERD (gastroesophageal reflux disease)   ? Headache, cluster   ? Hyperlipidemia   ? a. Mild - statin started 02/2014.  ? Hypothyroidism   ? Non-obstructive CAD   ? a. 02/2014 Cath: LM nl, LAD 82md, LCX nl, RCA 20p/m, EF 55-60%-->Med Rx.  ? Palpitations   ? none recently  ? Sleep apnea   ? uses cpap  ? ?Past Surgical History:  ?Procedure Laterality Date  ? COLONOSCOPY N/A 07/30/2013  ? Procedure: COLONOSCOPY;  Surgeon: PBeryle Beams MD;  Location: [facility];  Service: Endoscopy;  Laterality: N/A;  ? LEFT HEART CATH AND CORONARY ANGIOGRAPHY N/A 06/23/2019  ? Procedure: LEFT HEART CATH AND CORONARY ANGIOGRAPHY;  Surgeon: CSherren Mocha MD;  [facility];  Service: Cardiovascular;  Laterality: N/A;  ? LEFT HEART CATHETERIZATION WITH CORONARY ANGIOGRAM N/A 03/08/2014  ? Procedure: LEFT HEART CATHETERIZATION WITH CORONARY ANGIOGRAM;  Surgeon: CBurnell Blanks MD;  [facility];  Service: Cardiovascular;  Laterality: N/A;  ? TONSILLECTOMY AND ADENOIDECTOMY    ? as a child  ? ?Patient Active Problem List  ? Diagnosis Date Noted  ? Spondylolisthesis of lumbosacral region 10/19/2020  ? Chronic tension-type headache, not intractable 06/23/2020  ? Hyperlipidemia   ? GERD (gastroesophageal reflux disease)   ? Excessive daytime sleepiness 01/12/2018  ? Seasonal and perennial allergic rhinitis 01/12/2018  ? CAD (coronary artery disease)  03/25/2014  ? Pain of left calf 03/25/2014  ? Morbid obesity ([place] 03/09/2014  ? Midsternal chest pain 03/09/2014  ? Unstable angina ([place] 03/08/2014  ? Hypothyroidism 03/08/2014  ? Chest pain   ? OSA (obstructive sleep apnea) 07/27/2012  ? ? ?PCP: WChristain Sacramento MD ? ?REFERRING PROVIDER: WChristain Sacramento MD ? ?REFERRING DIAG: Z98.1 (ICD-10-CM) - Arthrodesis status ? ?THERAPY DIAG:  ?Low back pain, unspecified back pain laterality, unspecified chronicity, unspecified whether sciatica present ? ?Muscle spasm of back ? ?Muscle weakness (generalized) ? ?ONSET DATE: 10/19/2020 ? ?SUBJECTIVE:                                                                                                                                                                                          ? ?  SUBJECTIVE STATEMENT: ?Pt reports that his wife has told him that he can tell that he has gotten better.  Pt reports having an incontinence episode in the middle of the night one night this week. ? ?PERTINENT HISTORY:  ?Back surgery on 10/19/2020, anxiety, GERD, L heart cath; ? ?PAIN:  ?Are you having pain? Yes: NPRS scale: 5/10 ?Pain location: lumbar ?Pain description: pinching and tightness ?Aggravating factors: lifting and increased ambulation and sitting ?Relieving factors: meditation, stretching ? ? ?PATIENT GOALS:  improve sleep, improve pain to allow him to return to work  ? ? ?OBJECTIVE:  ? ?PATIENT SURVEYS:  ? ?Modified Oswestry 22/50 =44% Disability on 06/22/2021  ?FOTO 47% on 07/02/2021 ? ?SCREENING FOR RED FLAGS: ? ?07/02/2021: ?Bowel or bladder incontinence: Yes: Pt reports has not had any since last week, had 3-4 boughts of incontinence last week. ? ? ?COGNITION: ? Overall cognitive status: Within functional limits for tasks assessed   ?  ? ?LE MMT: ? ?MMT Right ?06/22/2021 Left ?06/22/2021  ?Hip flexion 4- 4-  ?Hip extension    ?Hip abduction 4- 4-  ?Hip adduction    ?Hip internal rotation    ?Hip external rotation    ?Knee flexion    ?Knee  extension    ?Lumbar Flexion 4- 4-  ?Lumbar Extension 4- 4-  ?Ankle inversion    ?Ankle eversion    ? (Blank rows = not tested) ? ?FUNCTIONAL TESTS:  ? ?06/11/2021: ?5 times sit to stand: 18.9 sec with UE on thighs ? ? ?TODAY'S TREATMENT  ? ?07/06/2021: ?Nustep Level 5 x6 min with PT present to discuss status ?Standing with foot on step:  Hamstring and hip flexion stretch 2x20 sec bilat  ?Seated piriformis stretch 2x20 sec bilat ?Seated pelvic tilt on dynadisc:  4 way, clockwise and counterclockwise x10 each ?Wall Slides 2x10 ?Lat Pull 45# 2x10 reps ?Seated Row 30# 2x10 reps ?4 way resisted gait with 15# x5 each ? ?07/02/2021: ?Nustep Level 5 x6 min with PT present to discuss status ?Standing with foot on step:  Hamstring and hip flexion stretch 2x20 sec bilat  ?Seated pelvic tilt on dynadisc:  4 way, clockwise and counterclockwise x10 each ?Lat Pull 45# 2x10 reps ?Seated Row 30# 2x10 reps ?Wall Slides 2x10 ?Wall push ups 2x10 ? ? ?PATIENT EDUCATION:  ?Education details: Reviewed HEP and importance of meditation ?Person educated: Patient ?Education method: Explanation ?Education comprehension: verbalized understanding ? ? ?HOME EXERCISE PROGRAM: ?Access Code: [email or web address] ?URL: [email or web address] ?Date: 07/02/2021 ?Prepared by: Juel Burrow ? ?Exercises ?Seated Pelvic Floor Contraction - 1-2 x daily - 7 x weekly - 1 sets - 10 reps ?Supine Transversus Abdominis Bracing - Hands on Stomach - 1 x daily - 7 x weekly - 2-3 sets - 10 reps ?Supine March with Posterior Pelvic Tilt - 1-2 x daily - 7 x weekly - 1 sets - 10 reps ?Hooklying Sequential Leg March and Lower - 1 x daily - 7 x weekly - 2-3 sets - 10 reps ?Hooklying Hamstring Stretch with Strap - 2 x daily - 7 x weekly - 1 sets - 3 reps - 30 sec hold ?Supine Piriformis Stretch with Foot on Ground - 1-2 x daily - 7 x weekly - 1 sets - 2 reps - 20 sec hold ?Supine Bridge - 1-2 x daily - 7 x weekly - 1 sets - 10 reps ?Standing Sports administrator with Table and Chair  Support - 2 x daily - 7 x weekly - 1 sets - 3 reps - 30  sec hold ? ? ?ASSESSMENT: ? ?CLINICAL IMPRESSION: ?Mr Vantine presents to PT with reporting some increased pain compared to last session, but overall feeling better.  Pt with most difficulty with 4 way resisted ambulation today, but able to perform with minimal cuing for posture and technique.  Pt overall demonstrating improved upright standing posture compared to when he first started PT. Pt continues to require skilled PT to progress towards goal related activities. ? ? ?OBJECTIVE IMPAIRMENTS Abnormal gait, decreased activity tolerance, decreased mobility, decreased ROM, decreased strength, hypomobility, increased muscle spasms, impaired flexibility, improper body mechanics, postural dysfunction, and pain.  ? ?ACTIVITY LIMITATIONS cleaning and occupation.  ? ?PERSONAL FACTORS Age, Time since onset of injury/illness/exacerbation, and 1 comorbidity: surgery 10/19/2020  are also affecting patient's functional outcome.  ? ? ?REHAB POTENTIAL: Good ? ?CLINICAL DECISION MAKING: Evolving/moderate complexity ? ?EVALUATION COMPLEXITY: Moderate ? ? ?GOALS: ?Goals reviewed with patient? Yes ? ?SHORT TERM GOALS: Target date: 02/19/2022 ? ?Pt will be independent with his initial HEP to improve flexibility and understanding of proper body mechanics. ?Baseline:  ?Goal status: MET ? ? ?LONG TERM GOALS: Target date: 08/31/2021 ? ?Pt will have atleast 4/5 MMT of BLE which will improve his efficiency with walking and other ADLs. ?Baseline:  ?Goal status: IN PROGRESS ? ?2.  Pt will report atleast 80% improvement in his back pain and function from the start of PT which will improve his ability to participate in work related tasks. ?Baseline:  ?Goal status: IN PROGRESS ? ?3.  Pt will be able to complete 5x sit to stand with min to mod UE assist in less than 20 sec to reflect improvements in his trunk/LE strength. ?Baseline:  ?Goal status: MET ? ?4.  Pt will be able to complete bed  mobility such as rolling and supine to/from sit without the need for UE support or increase in low back pain. ?Baseline:  ?Goal status: MET ? ?5.  The patient will have LE strength needed to rise 1x from a stan

## 2021-07-09 ENCOUNTER — Encounter: Payer: BC Managed Care – PPO | Admitting: Rehabilitative and Restorative Service Providers"

## 2021-07-13 ENCOUNTER — Encounter: Payer: BC Managed Care – PPO | Admitting: Physical Therapy

## 2021-07-16 ENCOUNTER — Other Ambulatory Visit: Payer: Self-pay | Admitting: Student

## 2021-07-16 ENCOUNTER — Ambulatory Visit: Payer: BC Managed Care – PPO | Attending: Student | Admitting: Rehabilitative and Restorative Service Providers"

## 2021-07-16 ENCOUNTER — Encounter: Payer: Self-pay | Admitting: Rehabilitative and Restorative Service Providers"

## 2021-07-16 DIAGNOSIS — M6281 Muscle weakness (generalized): Secondary | ICD-10-CM | POA: Diagnosis present

## 2021-07-16 DIAGNOSIS — M545 Low back pain, unspecified: Secondary | ICD-10-CM | POA: Diagnosis present

## 2021-07-16 DIAGNOSIS — M62830 Muscle spasm of back: Secondary | ICD-10-CM | POA: Insufficient documentation

## 2021-07-16 DIAGNOSIS — R519 Headache, unspecified: Secondary | ICD-10-CM

## 2021-07-16 NOTE — Therapy (Signed)
?OUTPATIENT PHYSICAL THERAPY THORACOLUMBAR TREATMENT ? ? ?Patient Name: Jerry Keller ?MRN: [id number] ?DOB:[birth year], 61 y.o., male ?Today's Date: 07/16/2021 ? ? PT End of Session - 07/16/21 1016   ? ? Visit Number 31   ? Date for PT Re-Evaluation 08/17/21   ? Authorization Type BCBS   ? PT Start Time 1015   ? PT Stop Time 1055   ? PT Time Calculation (min) 40 min   ? Activity Tolerance Patient tolerated treatment well   ? Behavior During Therapy [facility] for tasks assessed/performed   ? ?  ?  ? ?  ? ? ?Past Medical History:  ?Diagnosis Date  ? Anxiety   ? GERD (gastroesophageal reflux disease)   ? Headache, cluster   ? Hyperlipidemia   ? a. Mild - statin started 02/2014.  ? Hypothyroidism   ? Non-obstructive CAD   ? a. 02/2014 Cath: LM nl, LAD 41md, LCX nl, RCA 20p/m, EF 55-60%-->Med Rx.  ? Palpitations   ? none recently  ? Sleep apnea   ? uses cpap  ? ?Past Surgical History:  ?Procedure Laterality Date  ? COLONOSCOPY N/A 07/30/2013  ? Procedure: COLONOSCOPY;  Surgeon: PBeryle Beams MD;  [facility]: [facility];  Service: Endoscopy;  Laterality: N/A;  ? LEFT HEART CATH AND CORONARY ANGIOGRAPHY N/A 06/23/2019  ? Procedure: LEFT HEART CATH AND CORONARY ANGIOGRAPHY;  Surgeon: CSherren Mocha MD;  [facility];  Service: Cardiovascular;  Laterality: N/A;  ? LEFT HEART CATHETERIZATION WITH CORONARY ANGIOGRAM N/A 03/08/2014  ? Procedure: LEFT HEART CATHETERIZATION WITH CORONARY ANGIOGRAM;  Surgeon: CBurnell Blanks MD;  [facility];  Service: Cardiovascular;  Laterality: N/A;  ? TONSILLECTOMY AND ADENOIDECTOMY    ? as a child  ? ?Patient Active Problem List  ? Diagnosis Date Noted  ? Spondylolisthesis of lumbosacral region 10/19/2020  ? Chronic tension-type headache, not intractable 06/23/2020  ? Hyperlipidemia   ? GERD (gastroesophageal reflux disease)   ? Excessive daytime sleepiness 01/12/2018  ? Seasonal and perennial allergic rhinitis 01/12/2018  ? CAD (coronary artery disease)  03/25/2014  ? Pain of left calf 03/25/2014  ? Morbid obesity ([place] 03/09/2014  ? Midsternal chest pain 03/09/2014  ? Unstable angina ([place] 03/08/2014  ? Hypothyroidism 03/08/2014  ? Chest pain   ? OSA (obstructive sleep apnea) 07/27/2012  ? ? ?PCP: WChristain Sacramento MD ? ?REFERRING PROVIDER: WChristain Sacramento MD ? ?REFERRING DIAG: Z98.1 (ICD-10-CM) - Arthrodesis status ? ?THERAPY DIAG:  ?Low back pain, unspecified back pain laterality, unspecified chronicity, unspecified whether sciatica present ? ?Muscle spasm of back ? ?Muscle weakness (generalized) ? ?ONSET DATE: 10/19/2020 ? ?SUBJECTIVE:                                                                                                                                                                                          ? ?  SUBJECTIVE STATEMENT: ?Pt reports that he has missed riding on his motorcycle; he has not asked the MD about when he can return to driving his motorcycle yet. Pt states that on his week off of therapy, he had more "pinches" in his back. ? ?PERTINENT HISTORY:  ?Back surgery on 10/19/2020, anxiety, GERD, L heart cath; ? ?PAIN:  ?Are you having pain? Yes: NPRS scale: 4/10 ?Pain location: lumbar ?Pain description: pinching and tightness ?Aggravating factors: lifting and increased ambulation and sitting ?Relieving factors: meditation, stretching ? ? ?PATIENT GOALS:  improve sleep, improve pain to allow him to return to work  ? ? ?OBJECTIVE:  ? ?PATIENT SURVEYS:  ? ?Modified Oswestry 22/50 =44% Disability on 06/22/2021  ?FOTO 47% on 07/02/2021 ? ?SCREENING FOR RED FLAGS: ? ?07/02/2021: ?Bowel or bladder incontinence: Yes: Pt reports has not had any since last week, had 3-4 boughts of incontinence last week. ? ? ?COGNITION: ? Overall cognitive status: Within functional limits for tasks assessed   ?  ? ?LE MMT: ? ?MMT Right ?06/22/2021 Left ?06/22/2021  ?Hip flexion 4- 4-  ?Hip extension    ?Hip abduction 4- 4-  ?Hip adduction    ?Hip internal rotation    ?Hip  external rotation    ?Knee flexion    ?Knee extension    ?Lumbar Flexion 4- 4-  ?Lumbar Extension 4- 4-  ?Ankle inversion    ?Ankle eversion    ? (Blank rows = not tested) ? ?FUNCTIONAL TESTS:  ? ?06/11/2021: ?5 times sit to stand: 18.9 sec with UE on thighs ? ? ?TODAY'S TREATMENT  ? ?07/16/2021: ?Nustep Level 5 x6 min with PT present to discuss status ?Standing with foot on step:  Hamstring and hip flexion stretch 2x20 sec bilat  ?Lat Pull 45# 2x10 reps ?Seated piriformis stretch 2x20 sec bilat ?Seated pelvic tilt on dynadisc:  4 way, clockwise and counterclockwise x10 each ?4 way resisted gait with 15# x5 each ?Seated Row 30# 2x10 reps ?Wall push ups 2x10 ?90 degrees counter stretch 2x20 sec ? ?07/06/2021: ?Nustep Level 5 x6 min with PT present to discuss status ?Standing with foot on step:  Hamstring and hip flexion stretch 2x20 sec bilat  ?Seated piriformis stretch 2x20 sec bilat ?Seated pelvic tilt on dynadisc:  4 way, clockwise and counterclockwise x10 each ?Wall Slides 2x10 ?Lat Pull 45# 2x10 reps ?Seated Row 30# 2x10 reps ?4 way resisted gait with 15# x5 each ? ?07/02/2021: ?Nustep Level 5 x6 min with PT present to discuss status ?Standing with foot on step:  Hamstring and hip flexion stretch 2x20 sec bilat  ?Seated pelvic tilt on dynadisc:  4 way, clockwise and counterclockwise x10 each ?Lat Pull 45# 2x10 reps ?Seated Row 30# 2x10 reps ?Wall Slides 2x10 ?Wall push ups 2x10 ? ? ?PATIENT EDUCATION:  ?Education details: Reviewed HEP and importance of meditation ?Person educated: Patient ?Education method: Explanation ?Education comprehension: verbalized understanding ? ? ?HOME EXERCISE PROGRAM: ?Access Code: [email or web address] ?URL: [email or web address] ?Date: 07/02/2021 ?Prepared by: Juel Burrow ? ?Exercises ?Seated Pelvic Floor Contraction - 1-2 x daily - 7 x weekly - 1 sets - 10 reps ?Supine Transversus Abdominis Bracing - Hands on Stomach - 1 x daily - 7 x weekly - 2-3 sets - 10 reps ?Supine March with  Posterior Pelvic Tilt - 1-2 x daily - 7 x weekly - 1 sets - 10 reps ?Hooklying Sequential Leg March and Lower - 1 x daily - 7 x weekly - 2-3 sets - 10 reps ?Hooklying Hamstring Stretch with Strap -  2 x daily - 7 x weekly - 1 sets - 3 reps - 30 sec hold ?Supine Piriformis Stretch with Foot on Ground - 1-2 x daily - 7 x weekly - 1 sets - 2 reps - 20 sec hold ?Supine Bridge - 1-2 x daily - 7 x weekly - 1 sets - 10 reps ?Standing Sports administrator with Table and Chair Support - 2 x daily - 7 x weekly - 1 sets - 3 reps - 30 sec hold ? ? ?ASSESSMENT: ? ?CLINICAL IMPRESSION: ?Mr Kitchings presents to PT with reports that he had a little pain without having PT last week, but overall has not had any declines.  Pt educated on the importance of calming his mind and relaxing prior to going to bed at night to allow him to sleep better.  Added 90 degree counter stretch, as pt could perform this at work in standing position easily.  Pt continues to require skilled PT to progress towards goal related activities and decreased overall pain. ? ? ?OBJECTIVE IMPAIRMENTS Abnormal gait, decreased activity tolerance, decreased mobility, decreased ROM, decreased strength, hypomobility, increased muscle spasms, impaired flexibility, improper body mechanics, postural dysfunction, and pain.  ? ?ACTIVITY LIMITATIONS cleaning and occupation.  ? ?PERSONAL FACTORS Age, Time since onset of injury/illness/exacerbation, and 1 comorbidity: surgery 10/19/2020  are also affecting patient's functional outcome.  ? ? ?REHAB POTENTIAL: Good ? ?CLINICAL DECISION MAKING: Evolving/moderate complexity ? ?EVALUATION COMPLEXITY: Moderate ? ? ?GOALS: ?Goals reviewed with patient? Yes ? ?SHORT TERM GOALS: Target date: 02/19/2022 ? ?Pt will be independent with his initial HEP to improve flexibility and understanding of proper body mechanics. ?Baseline:  ?Goal status: MET ? ? ?LONG TERM GOALS: Target date: 09/10/2021 ? ?Pt will have atleast 4/5 MMT of BLE which will improve his  efficiency with walking and other ADLs. ?Baseline:  ?Goal status: IN PROGRESS ? ?2.  Pt will report atleast 80% improvement in his back pain and function from the start of PT which will improve his ability to pa

## 2021-07-20 ENCOUNTER — Ambulatory Visit: Payer: BC Managed Care – PPO | Admitting: Physical Therapy

## 2021-07-20 DIAGNOSIS — M6281 Muscle weakness (generalized): Secondary | ICD-10-CM

## 2021-07-20 DIAGNOSIS — M545 Low back pain, unspecified: Secondary | ICD-10-CM

## 2021-07-20 DIAGNOSIS — M62830 Muscle spasm of back: Secondary | ICD-10-CM

## 2021-07-20 NOTE — Therapy (Signed)
?OUTPATIENT PHYSICAL THERAPY THORACOLUMBAR TREATMENT ? ? ?Patient Name: Jerry Keller ?MRN: [id number] ?DOB:1960-08-30, 61 y.o., male ?Today's Date: 07/20/2021 ? ? PT End of Session - 07/20/21 1017   ? ? Visit Number [id number]   ? Date for PT Re-Evaluation 08/17/21   ? Authorization Type BCBS   ? Authorization Time Period 04/15/2021 - 04/14/2022   ? PT Start Time 1017   ? PT Stop Time 1100   ? PT Time Calculation (min) 43 min   ? Activity Tolerance Patient tolerated treatment well   ? ?  ?  ? ?  ? ? ?Past Medical History:  ?Diagnosis Date  ? Anxiety   ? GERD (gastroesophageal reflux disease)   ? Headache, cluster   ? Hyperlipidemia   ? a. Mild - statin started 02/2014.  ? Hypothyroidism   ? Non-obstructive CAD   ? a. 02/2014 Cath: LM nl, LAD 62md, LCX nl, RCA 20p/m, EF 55-60%-->Med Rx.  ? Palpitations   ? none recently  ? Sleep apnea   ? uses cpap  ? ?Past Surgical History:  ?Procedure Laterality Date  ? COLONOSCOPY N/A 07/30/2013  ? Procedure: COLONOSCOPY;  Surgeon: PBeryle Beams MD;  Location: [facility];  Service: Endoscopy;  Laterality: N/A;  ? LEFT HEART CATH AND CORONARY ANGIOGRAPHY N/A 06/23/2019  ? Procedure: LEFT HEART CATH AND CORONARY ANGIOGRAPHY;  Surgeon: CSherren Mocha MD;  [facility];  Service: Cardiovascular;  Laterality: N/A;  ? LEFT HEART CATHETERIZATION WITH CORONARY ANGIOGRAM N/A 03/08/2014  ? Procedure: LEFT HEART CATHETERIZATION WITH CORONARY ANGIOGRAM;  Surgeon: CBurnell Blanks MD;  [facility];  Service: Cardiovascular;  Laterality: N/A;  ? TONSILLECTOMY AND ADENOIDECTOMY    ? as a child  ? ?Patient Active Problem List  ? Diagnosis Date Noted  ? Spondylolisthesis of lumbosacral region 10/19/2020  ? Chronic tension-type headache, not intractable 06/23/2020  ? Hyperlipidemia   ? GERD (gastroesophageal reflux disease)   ? Excessive daytime sleepiness 01/12/2018  ? Seasonal and perennial allergic rhinitis 01/12/2018  ? CAD (coronary artery disease) 03/25/2014  ?  Pain of left calf 03/25/2014  ? Morbid obesity ([place] 03/09/2014  ? Midsternal chest pain 03/09/2014  ? [place]stable [place]ina ([place] 03/08/2014  ? Hypothyroidism 03/08/2014  ? Chest pain   ? OSA (obstructive sleep apnea) 07/27/2012  ? ? ?PCP: WChristain Sacramento MD ? ?REFERRING PROVIDER: BPatricia Nettle NP ? ?REFERRING DIAG: Z98.1 (ICD-10-CM) - Arthrodesis status ? ?THERAPY DIAG:  ?Low back pain, unspecified back pain laterality, unspecified chronicity, unspecified whether sciatica present ? ?Muscle spasm of back ? ?Muscle weakness (generalized) ? ?ONSET DATE: 10/19/2020 ? ?SUBJECTIVE:                                                                                                                                                                                          ? ?  SUBJECTIVE STATEMENT:  It's been horrible.  The nerves in my glutes has been bad.  I'm back to using the baseball.  I'm back up 5 pounds.  Just diagnosed with diabetes and going on medication.  ?PERTINENT HISTORY:  ?Back surgery on 10/19/2020, anxiety, GERD, L heart cath; ? ?PAIN:   right LE today and all the way to my heel.   ?Are you having pain? Yes:  5/10  Lumbar and right LE to heel ? ? ?PATIENT GOALS:  improve sleep, improve pain to allow him to return to work  ? ? ?OBJECTIVE:  ? ?PATIENT SURVEYS:  ? ?Modified Oswestry 22/50 =44% Disability on 06/22/2021  ?FOTO 47% on 07/02/2021 ? ?SCREENING FOR RED FLAGS: ? ?07/02/2021: ?Bowel or bladder incontinence: Yes: Pt reports has not had any since last week, had 3-4 boughts of incontinence last week. ? ? ?COGNITION: ? Overall cognitive status: Within functional limits for tasks assessed   ?  ? ?LE MMT: ? ?MMT Right ?06/22/2021 Left ?06/22/2021  ?Hip flexion 4- 4-  ?Hip extension    ?Hip abduction 4- 4-  ?Hip adduction    ?Hip internal rotation    ?Hip external rotation    ?Knee flexion    ?Knee extension    ?Lumbar Flexion 4- 4-  ?Lumbar Extension 4- 4-  ?Ankle inversion    ?Ankle eversion    ? (Blank rows = not  tested) ? ?FUNCTIONAL TESTS:  ? ?06/11/2021: ?5 times sit to stand: 18.9 sec with UE on thighs ? ? ?TODAY'S TREATMENT  ?07/20/2021: ?Nustep Level 5 x11 min with PT present to discuss status ?Neuroscience of pain education ?Seated pelvic tilt on dynadisc:  4 way, clockwise and counterclockwise x10 each ?Moist heat 5 min ?DRY NEEDLING: ?Dry needling consent given? yes ?Educational handouts provided? Previously  ?Muscles treated: bil lumbar multifidi, gluteals ?Response from dry needling: improved soft tissue mobility and pain  ?E-stim with DN: 2.0 ma 15 min  ? ? ?07/16/2021: ?Nustep Level 5 x6 min with PT present to discuss status ?Standing with foot on step:  Hamstring and hip flexion stretch 2x20 sec bilat  ?Lat Pull 45# 2x10 reps ?Seated piriformis stretch 2x20 sec bilat ?Seated pelvic tilt on dynadisc:  4 way, clockwise and counterclockwise x10 each ?4 way resisted gait with 15# x5 each ?Seated Row 30# 2x10 reps ?Wall push ups 2x10 ?90 degrees counter stretch 2x20 sec ? ?07/06/2021: ?Nustep Level 5 x6 min with PT present to discuss status ?Standing with foot on step:  Hamstring and hip flexion stretch 2x20 sec bilat  ?Seated piriformis stretch 2x20 sec bilat ?Seated pelvic tilt on dynadisc:  4 way, clockwise and counterclockwise x10 each ?Wall Slides 2x10 ?Lat Pull 45# 2x10 reps ?Seated Row 30# 2x10 reps ?4 way resisted gait with 15# x5 each ? ?07/02/2021: ?Nustep Level 5 x6 min with PT present to discuss status ?Standing with foot on step:  Hamstring and hip flexion stretch 2x20 sec bilat  ?Seated pelvic tilt on dynadisc:  4 way, clockwise and counterclockwise x10 each ?Lat Pull 45# 2x10 reps ?Seated Row 30# 2x10 reps ?Wall Slides 2x10 ?Wall push ups 2x10 ? ? ?PATIENT EDUCATION:  ?Education details: Reviewed HEP and importance of meditation ?Person educated: Patient ?Education method: Explanation ?Education comprehension: verbalized understanding ? ? ?HOME EXERCISE PROGRAM: ?Access Code: [email or web address] ?URL:  [email or web address] ?Date: 07/02/2021 ?Prepared by: Juel Burrow ? ?Exercises ?Seated Pelvic Floor Contraction - 1-2 x daily - 7 x weekly - 1 sets - 10 reps ?Supine Transversus Abdominis Bracing -  Hands on Stomach - 1 x daily - 7 x weekly - 2-3 sets - 10 reps ?Supine March with Posterior Pelvic Tilt - 1-2 x daily - 7 x weekly - 1 sets - 10 reps ?Hooklying Sequential Leg March and Lower - 1 x daily - 7 x weekly - 2-3 sets - 10 reps ?Hooklying Hamstring Stretch with Strap - 2 x daily - 7 x weekly - 1 sets - 3 reps - 30 sec hold ?Supine Piriformis Stretch with Foot on Ground - 1-2 x daily - 7 x weekly - 1 sets - 2 reps - 20 sec hold ?Supine Bridge - 1-2 x daily - 7 x weekly - 1 sets - 10 reps ?Standing Sports administrator with Table and Chair Support - 2 x daily - 7 x weekly - 1 sets - 3 reps - 30 sec hold ? ? ?ASSESSMENT: ? ?CLINICAL IMPRESSION:  The patient reports a rough week of increased pain levels especially right lumbar and LE in L5 dermatomal pattern.  Neuroscience of pain education on the effects sleep, nutrition and stress on pain levels and strategies to reduce soft tissue irritability.Therapist monitoring response to all interventions and modifying treatment accordingly.   ? ? ?OBJECTIVE IMPAIRMENTS Abnormal gait, decreased activity tolerance, decreased mobility, decreased ROM, decreased strength, hypomobility, increased muscle spasms, impaired flexibility, improper body mechanics, postural dysfunction, and pain.  ? ?ACTIVITY LIMITATIONS cleaning and occupation.  ? ?PERSONAL FACTORS Age, Time since onset of injury/illness/exacerbation, and 1 comorbidity: surgery 10/19/2020  are also affecting patient's functional outcome.  ? ? ?REHAB POTENTIAL: Good ? ?CLINICAL DECISION MAKING: Evolving/moderate complexity ? ?EVALUATION COMPLEXITY: Moderate ? ? ?GOALS: ?Goals reviewed with patient? Yes ? ?SHORT TERM GOALS: Target date: 02/19/2022 ? ?Pt will be independent with his initial HEP to improve flexibility  and understanding of proper body mechanics. ?Baseline:  ?Goal status: MET ? ? ?LONG TERM GOALS: Target date: 09/14/2021 ? ?Pt will have atleast 4/5 MMT of BLE which will improve his efficiency with walking and other ADLs. ?Clarise Cruz

## 2021-07-27 ENCOUNTER — Ambulatory Visit: Payer: BC Managed Care – PPO | Admitting: Physical Therapy

## 2021-07-27 DIAGNOSIS — M545 Low back pain, unspecified: Secondary | ICD-10-CM | POA: Diagnosis not present

## 2021-07-27 DIAGNOSIS — M6281 Muscle weakness (generalized): Secondary | ICD-10-CM

## 2021-07-27 DIAGNOSIS — M62830 Muscle spasm of back: Secondary | ICD-10-CM

## 2021-07-27 NOTE — Therapy (Signed)
?OUTPATIENT PHYSICAL THERAPY THORACOLUMBAR TREATMENT ? ? ?Patient Name: Jerry Keller ?MRN: [id number] ?DOB:08/17/1960, 61 y.o., male ?Today's Date: 07/27/2021 ? ? PT End of Session - 07/27/21 1022   ? ? Visit Number 33   ? Date for PT Re-Evaluation 08/17/21   ? Authorization Type BCBS   ? Authorization Time Period 04/15/2021 - 04/14/2022   ? PT Start Time 1017   ? PT Stop Time 1110   ? PT Time Calculation (min) 53 min   ? Activity Tolerance Patient tolerated treatment well   ? ?  ?  ? ?  ? ? ?Past Medical History:  ?Diagnosis Date  ? Anxiety   ? GERD (gastroesophageal reflux disease)   ? Headache, cluster   ? Hyperlipidemia   ? a. Mild - statin started 02/2014.  ? Hypothyroidism   ? Non-obstructive CAD   ? a. 02/2014 Cath: LM nl, LAD 53md, LCX nl, RCA 20p/m, EF 55-60%-->Med Rx.  ? Palpitations   ? none recently  ? Sleep apnea   ? uses cpap  ? ?Past Surgical History:  ?Procedure Laterality Date  ? COLONOSCOPY N/A 07/30/2013  ? Procedure: COLONOSCOPY;  Surgeon: PBeryle Beams MD;  Location: [facility];  Service: Endoscopy;  Laterality: N/A;  ? LEFT HEART CATH AND CORONARY ANGIOGRAPHY N/A 06/23/2019  ? Procedure: LEFT HEART CATH AND CORONARY ANGIOGRAPHY;  Surgeon: CSherren Mocha MD;  [facility];  Service: Cardiovascular;  Laterality: N/A;  ? LEFT HEART CATHETERIZATION WITH CORONARY ANGIOGRAM N/A 03/08/2014  ? Procedure: LEFT HEART CATHETERIZATION WITH CORONARY ANGIOGRAM;  Surgeon: CBurnell Blanks MD;  [facility];  Service: Cardiovascular;  Laterality: N/A;  ? TONSILLECTOMY AND ADENOIDECTOMY    ? as a child  ? ?Patient Active Problem List  ? Diagnosis Date Noted  ? Spondylolisthesis of lumbosacral region 10/19/2020  ? Chronic tension-type headache, not intractable 06/23/2020  ? Hyperlipidemia   ? GERD (gastroesophageal reflux disease)   ? Excessive daytime sleepiness 01/12/2018  ? Seasonal and perennial allergic rhinitis 01/12/2018  ? CAD (coronary artery disease) 03/25/2014  ?  Pain of left calf 03/25/2014  ? Morbid obesity ([place] 03/09/2014  ? Midsternal chest pain 03/09/2014  ? [place]stable [place]ina ([place] 03/08/2014  ? Hypothyroidism 03/08/2014  ? Chest pain   ? OSA (obstructive sleep apnea) 07/27/2012  ? ? ?PCP: WChristain Sacramento MD ? ?REFERRING PROVIDER: BPatricia Nettle NP ? ?REFERRING DIAG: Z98.1 (ICD-10-CM) - Arthrodesis status ? ?THERAPY DIAG:  ?Low back pain, unspecified back pain laterality, unspecified chronicity, unspecified whether sciatica present ? ?Muscle spasm of back ? ?Muscle weakness (generalized) ? ?ONSET DATE: 10/19/2020 ? ?SUBJECTIVE:                                                                                                                                                                                          ? ?  SUBJECTIVE STATEMENT:  Last DN helped me so much.  The right side is worse than the left.  It's generating from the back but goes into the right hip.  OSHA is at work, it's tiring.   ?PERTINENT HISTORY:  ?Back surgery on 10/19/2020, anxiety, GERD, L heart cath; ? ?PAIN:   right LE today and all the way to my heel.   ?Are you having pain? Yes:  5/10  Lumbar and right LE to heel ? ? ?PATIENT GOALS:  improve sleep, improve pain to allow him to return to work  ? ? ?OBJECTIVE:  ? ?PATIENT SURVEYS:  ? ?Modified Oswestry 22/50 =44% Disability on 06/22/2021  ?FOTO 47% on 07/02/2021 ? ?SCREENING FOR RED FLAGS: ? ?07/02/2021: ?Bowel or bladder incontinence: Yes: Pt reports has not had any since last week, had 3-4 boughts of incontinence last week. ? ? ?COGNITION: ? Overall cognitive status: Within functional limits for tasks assessed   ?  ? ?LE MMT: ? ?MMT Right ?06/22/2021 Left ?06/22/2021  ?Hip flexion 4- 4-  ?Hip extension    ?Hip abduction 4- 4-  ?Hip adduction    ?Hip internal rotation    ?Hip external rotation    ?Knee flexion    ?Knee extension    ?Lumbar Flexion 4- 4-  ?Lumbar Extension 4- 4-  ?Ankle inversion    ?Ankle eversion    ? (Blank rows = not  tested) ? ?FUNCTIONAL TESTS:  ? ?06/11/2021: ?5 times sit to stand: 18.9 sec with UE on thighs ? ? ?TODAY'S TREATMENT  ? ?07/27/21: ?Nustep Level 5 x11 min with PT present to discuss status ?Standing with foot on step:  Hamstring and hip flexion stretch 2x20 sec bilat  ?Lat Pull 45# 2x10 reps ?Seated pelvic tilt on dynadisc:  4 way, clockwise and counterclockwise x10 each ?QL hike on blue foam 10x each side ?Moist heat 5 min ?DRY NEEDLING: ?Dry needling consent given? yes ?Educational handouts provided? Previously  ?Muscles treated: bil lumbar multifidi, gluteals ?Response from dry needling: improved soft tissue mobility and pain  ?E-stim with DN: 2.0 ma 15 min  ? ?07/20/2021: ?Nustep Level 5 x11 min with PT present to discuss status ?Neuroscience of pain education ?Seated pelvic tilt on dynadisc:  4 way, clockwise and counterclockwise x10 each ?Moist heat 5 min ?DRY NEEDLING: ?Dry needling consent given? yes ?Educational handouts provided? Previously  ?Muscles treated: bil lumbar multifidi, gluteals ?Response from dry needling: improved soft tissue mobility and pain  ?E-stim with DN: 2.0 ma 15 min  ? ? ?07/16/2021: ?Nustep Level 5 x6 min with PT present to discuss status ?Standing with foot on step:  Hamstring and hip flexion stretch 2x20 sec bilat  ?Lat Pull 45# 2x10 reps ?Seated piriformis stretch 2x20 sec bilat ?Seated pelvic tilt on dynadisc:  4 way, clockwise and counterclockwise x10 each ?4 way resisted gait with 15# x5 each ?Seated Row 30# 2x10 reps ?Wall push ups 2x10 ?90 degrees counter stretch 2x20 sec ? ?07/06/2021: ?Nustep Level 5 x6 min with PT present to discuss status ?Standing with foot on step:  Hamstring and hip flexion stretch 2x20 sec bilat  ?Seated piriformis stretch 2x20 sec bilat ?Seated pelvic tilt on dynadisc:  4 way, clockwise and counterclockwise x10 each ?Wall Slides 2x10 ?Lat Pull 45# 2x10 reps ?Seated Row 30# 2x10 reps ?4 way resisted gait with 15# x5 each ? ?07/02/2021: ?Nustep Level 5 x6 min  with PT present to discuss status ?Standing with foot on step:  Hamstring and hip flexion stretch  2x20 sec bilat  ?Seated pelvic tilt on dynadisc:  4 way, clockwise and counterclockwise x10 each ?Lat Pull 45# 2x10 reps ?Seated Row 30# 2x10 reps ?Wall Slides 2x10 ?Wall push ups 2x10 ? ? ?PATIENT EDUCATION:  ?Education details: Reviewed HEP and importance of meditation ?Person educated: Patient ?Education method: Explanation ?Education comprehension: verbalized understanding ? ? ?HOME EXERCISE PROGRAM: ?Access Code: [email or web address] ?URL: [email or web address] ?Date: 07/02/2021 ?Prepared by: Juel Burrow ? ?Exercises ?Seated Pelvic Floor Contraction - 1-2 x daily - 7 x weekly - 1 sets - 10 reps ?Supine Transversus Abdominis Bracing - Hands on Stomach - 1 x daily - 7 x weekly - 2-3 sets - 10 reps ?Supine March with Posterior Pelvic Tilt - 1-2 x daily - 7 x weekly - 1 sets - 10 reps ?Hooklying Sequential Leg March and Lower - 1 x daily - 7 x weekly - 2-3 sets - 10 reps ?Hooklying Hamstring Stretch with Strap - 2 x daily - 7 x weekly - 1 sets - 3 reps - 30 sec hold ?Supine Piriformis Stretch with Foot on Ground - 1-2 x daily - 7 x weekly - 1 sets - 2 reps - 20 sec hold ?Supine Bridge - 1-2 x daily - 7 x weekly - 1 sets - 10 reps ?Standing Sports administrator with Table and Chair Support - 2 x daily - 7 x weekly - 1 sets - 3 reps - 30 sec  ? ? ?ASSESSMENT: ? ?CLINICAL IMPRESSION:  The patient presents with more upright posture compared to last visit but still with antalgic gait.  He is able to return to some strengthening and mobility ex's today.  Good response to DN combined with ES to address multiple tender points and lumbar multifidi dysfunction.  He is able to do the full treatment time but states he needs to use the rest room urgently at then end of session.   ? ? ?OBJECTIVE IMPAIRMENTS Abnormal gait, decreased activity tolerance, decreased mobility, decreased ROM, decreased strength, hypomobility, increased muscle  spasms, impaired flexibility, improper body mechanics, postural dysfunction, and pain.  ? ?ACTIVITY LIMITATIONS cleaning and occupation.  ? ?PERSONAL FACTORS Age, Time since onset of injury/illness/exacerbation, and

## 2021-07-30 ENCOUNTER — Encounter: Payer: Self-pay | Admitting: Rehabilitative and Restorative Service Providers"

## 2021-07-30 ENCOUNTER — Ambulatory Visit: Payer: BC Managed Care – PPO | Admitting: Rehabilitative and Restorative Service Providers"

## 2021-07-30 DIAGNOSIS — M6281 Muscle weakness (generalized): Secondary | ICD-10-CM

## 2021-07-30 DIAGNOSIS — M545 Low back pain, unspecified: Secondary | ICD-10-CM | POA: Diagnosis not present

## 2021-07-30 DIAGNOSIS — M62830 Muscle spasm of back: Secondary | ICD-10-CM

## 2021-07-30 NOTE — Therapy (Signed)
?OUTPATIENT PHYSICAL THERAPY THORACOLUMBAR TREATMENT ? ? ?Patient Name: Jerry Keller ?MRN: [id number] ?DOB:1960/06/10, 61 y.o., male ?Today's Date: 07/30/2021 ? ? PT End of Session - 07/30/21 1018   ? ? Visit Number [id number]   ? Date for PT Re-Evaluation 08/17/21   ? Authorization Type BCBS   ? Authorization Time Period 04/15/2021 - 04/14/2022   ? PT Start Time 1015   ? PT Stop Time 1055   ? PT Time Calculation (min) 40 min   ? Activity Tolerance Patient tolerated treatment well   ? Behavior During Therapy [facility] for tasks assessed/performed   ? ?  ?  ? ?  ? ? ?Past Medical History:  ?Diagnosis Date  ? Anxiety   ? GERD (gastroesophageal reflux disease)   ? Headache, cluster   ? Hyperlipidemia   ? a. Mild - statin started 02/2014.  ? Hypothyroidism   ? Non-obstructive CAD   ? a. 02/2014 Cath: LM nl, LAD 97md, LCX nl, RCA 20p/m, EF 55-60%-->Med Rx.  ? Palpitations   ? none recently  ? Sleep apnea   ? uses cpap  ? ?Past Surgical History:  ?Procedure Laterality Date  ? COLONOSCOPY N/A 07/30/2013  ? Procedure: COLONOSCOPY;  Surgeon: PBeryle Beams MD;  Location: [facility];  Service: Endoscopy;  Laterality: N/A;  ? LEFT HEART CATH AND CORONARY ANGIOGRAPHY N/A 06/23/2019  ? Procedure: LEFT HEART CATH AND CORONARY ANGIOGRAPHY;  Surgeon: CSherren Mocha MD;  [facility];  Service: Cardiovascular;  Laterality: N/A;  ? LEFT HEART CATHETERIZATION WITH CORONARY ANGIOGRAM N/A 03/08/2014  ? Procedure: LEFT HEART CATHETERIZATION WITH CORONARY ANGIOGRAM;  Surgeon: CBurnell Blanks MD;  [facility];  Service: Cardiovascular;  Laterality: N/A;  ? TONSILLECTOMY AND ADENOIDECTOMY    ? as a child  ? ?Patient Active Problem List  ? Diagnosis Date Noted  ? Spondylolisthesis of lumbosacral region 10/19/2020  ? Chronic tension-type headache, not intractable 06/23/2020  ? Hyperlipidemia   ? GERD (gastroesophageal reflux disease)   ? Excessive daytime sleepiness 01/12/2018  ? Seasonal and perennial allergic  rhinitis 01/12/2018  ? CAD (coronary artery disease) 03/25/2014  ? Pain of left calf 03/25/2014  ? Morbid obesity ([place] 03/09/2014  ? Midsternal chest pain 03/09/2014  ? Unstable angina ([place] 03/08/2014  ? Hypothyroidism 03/08/2014  ? Chest pain   ? OSA (obstructive sleep apnea) 07/27/2012  ? ? ?PCP: WChristain Sacramento MD ? ?REFERRING PROVIDER: BPatricia Nettle NP ? ?REFERRING DIAG: Z98.1 (ICD-10-CM) - Arthrodesis status ? ?THERAPY DIAG:  ?Low back pain, unspecified back pain laterality, unspecified chronicity, unspecified whether sciatica present ? ?Muscle spasm of back ? ?Muscle weakness (generalized) ? ?ONSET DATE: 10/19/2020 ? ?SUBJECTIVE:                                                                                                                                                                                          ? ?  SUBJECTIVE STATEMENT:  Pt reports that his pain is feeling deeper. ? ?PERTINENT HISTORY:  ?Back surgery on 10/19/2020, anxiety, GERD, L heart cath; ? ?PAIN:     ?Are you having pain? Yes:  3/10  Lumbar.  Reports as a pinching and deeper soreness ? ? ?PATIENT GOALS:  improve sleep, improve pain to allow him to return to work  ? ? ?OBJECTIVE:  ? ?PATIENT SURVEYS:  ? ?Modified Oswestry 22/50 =44% Disability on 06/22/2021  ?FOTO 47% on 07/02/2021 ? ?SCREENING FOR RED FLAGS: ? ?07/02/2021: ?Bowel or bladder incontinence: Yes: Pt reports has not had any since last week, had 3-4 boughts of incontinence last week. ? ? ?COGNITION: ? Overall cognitive status: Within functional limits for tasks assessed   ?  ? ?LE MMT: ? ?MMT Right ?06/22/2021 Left ?06/22/2021  ?Hip flexion 4- 4-  ?Hip extension    ?Hip abduction 4- 4-  ?Hip adduction    ?Hip internal rotation    ?Hip external rotation    ?Knee flexion    ?Knee extension    ?Lumbar Flexion 4- 4-  ?Lumbar Extension 4- 4-  ?Ankle inversion    ?Ankle eversion    ? (Blank rows = not tested) ? ?FUNCTIONAL TESTS:  ? ?07/30/2021:  5 times sit to stand: 21.5 sec with UE  on thighs ? ?06/11/2021: ?5 times sit to stand: 18.9 sec with UE on thighs ? ? ?TODAY'S TREATMENT  ? ?07/30/2021: ?Nustep Level 5 x6 min with PT present to discuss status ?5 times sit to stand: 21.5 sec with UE on thighs ?Seated pelvic tilt on dynadisc:  4 way, clockwise and counterclockwise x10 each ?Seated piriformis stretch x20 sec bilat ?Sit to/from stand sitting on 2 blue foam mats holding 5# kettlebell:  x10 with chest press, x10 overhead press ?Prone:  HS curl x10 bilat, hip extension x10 bilat ?Seated 3 way green pball rollout 5x10 sec each ?Wall push ups 2x10 with cuing for core stability ? ?07/27/21: ?Nustep Level 5 x11 min with PT present to discuss status ?Standing with foot on step:  Hamstring and hip flexion stretch 2x20 sec bilat  ?Lat Pull 45# 2x10 reps ?Seated pelvic tilt on dynadisc:  4 way, clockwise and counterclockwise x10 each ?QL hike on blue foam 10x each side ?Moist heat 5 min ?DRY NEEDLING: ?Dry needling consent given? yes ?Educational handouts provided? Previously  ?Muscles treated: bil lumbar multifidi, gluteals ?Response from dry needling: improved soft tissue mobility and pain  ?E-stim with DN: 2.0 ma 15 min  ? ?07/20/2021: ?Nustep Level 5 x11 min with PT present to discuss status ?Neuroscience of pain education ?Seated pelvic tilt on dynadisc:  4 way, clockwise and counterclockwise x10 each ?Moist heat 5 min ?DRY NEEDLING: ?Dry needling consent given? yes ?Educational handouts provided? Previously  ?Muscles treated: bil lumbar multifidi, gluteals ?Response from dry needling: improved soft tissue mobility and pain  ?E-stim with DN: 2.0 ma 15 min  ? ? ?PATIENT EDUCATION:  ?Education details: Reviewed HEP and importance of meditation ?Person educated: Patient ?Education method: Explanation ?Education comprehension: verbalized understanding ? ? ?HOME EXERCISE PROGRAM: ?Access Code: [email or web address] ?URL: [email or web address] ?Date: 07/02/2021 ?Prepared by: Juel Burrow ? ?Exercises ?Seated Pelvic Floor Contraction - 1-2 x daily - 7 x weekly - 1 sets - 10 reps ?Supine Transversus Abdominis Bracing - Hands on Stomach - 1 x daily - 7 x weekly - 2-3 sets - 10 reps ?Supine March with Posterior Pelvic Tilt - 1-2 x daily - 7 x weekly - 1  sets - 10 reps ?Hooklying Sequential Leg March and Lower - 1 x daily - 7 x weekly - 2-3 sets - 10 reps ?Hooklying Hamstring Stretch with Strap - 2 x daily - 7 x weekly - 1 sets - 3 reps - 30 sec hold ?Supine Piriformis Stretch with Foot on Ground - 1-2 x daily - 7 x weekly - 1 sets - 2 reps - 20 sec hold ?Supine Bridge - 1-2 x daily - 7 x weekly - 1 sets - 10 reps ?Standing Sports administrator with Table and Chair Support - 2 x daily - 7 x weekly - 1 sets - 3 reps - 30 sec  ? ? ?ASSESSMENT: ? ?CLINICAL IMPRESSION:   ?Mr Alomar presents with some reports of having a deeper pain/pinching today.  Pt with difficulty with sit to/from stand with kettlebell and requires cuing to lean forward with sit to stand secondary to LE weakness. Pt continues to require cuing for core stability throughout session.  Pt with hip weakness and had difficulty with prone hip extension. ? ? ?OBJECTIVE IMPAIRMENTS Abnormal gait, decreased activity tolerance, decreased mobility, decreased ROM, decreased strength, hypomobility, increased muscle spasms, impaired flexibility, improper body mechanics, postural dysfunction, and pain.  ? ?ACTIVITY LIMITATIONS cleaning and occupation.  ? ?PERSONAL FACTORS Age, Time since onset of injury/illness/exacerbation, and 1 comorbidity: surgery 10/19/2020  are also affecting patient's functional outcome.  ? ? ?REHAB POTENTIAL: Good ? ?CLINICAL DECISION MAKING: Evolving/moderate complexity ? ?EVALUATION COMPLEXITY: Moderate ? ? ?GOALS: ?Goals reviewed with patient? Yes ? ?SHORT TERM GOALS: Target date: 02/19/2022 ? ?Pt will be independent with his initial HEP to improve flexibility and understanding of proper body mechanics. ?Baseline:  ?Goal status:  MET ? ? ?LONG TERM GOALS: Target date: 09/24/2021 ? ?Pt will have atleast 4/5 MMT of BLE which will improve his efficiency with walking and other ADLs. ?Baseline:  ?Goal status: IN PROGRESS ? ?2.  Pt will report atleast 80% improve

## 2021-08-03 ENCOUNTER — Encounter: Payer: BC Managed Care – PPO | Admitting: Physical Therapy

## 2021-08-06 ENCOUNTER — Ambulatory Visit: Payer: BC Managed Care – PPO | Admitting: Rehabilitative and Restorative Service Providers"

## 2021-08-06 ENCOUNTER — Telehealth: Payer: Self-pay | Admitting: Rehabilitative and Restorative Service Providers"

## 2021-08-06 NOTE — Telephone Encounter (Signed)
Called and left a message with patient about his missed visit on 08/06/2021 at 10:15 am.  Reminded him of next scheduled appointment and to call back with any questions. ?

## 2021-08-10 ENCOUNTER — Ambulatory Visit: Payer: BC Managed Care – PPO | Admitting: Physical Therapy

## 2021-08-10 DIAGNOSIS — M545 Low back pain, unspecified: Secondary | ICD-10-CM

## 2021-08-10 DIAGNOSIS — M62830 Muscle spasm of back: Secondary | ICD-10-CM

## 2021-08-10 DIAGNOSIS — M6281 Muscle weakness (generalized): Secondary | ICD-10-CM

## 2021-08-10 NOTE — Therapy (Addendum)
?OUTPATIENT PHYSICAL THERAPY THORACOLUMBAR TREATMENT ? ? ?Patient Name: Jerry Keller ?MRN: [id number] ?DOB:06-14-1960, 61 y.o., male ?Today's Date: 08/10/2021 ? ? PT End of Session - 08/10/21 1006   ? ? Visit Number [id number]   ? Date for PT Re-Evaluation 08/17/21   ? Authorization Type BCBS   ? Authorization Time Period 04/15/2021 - 04/14/2022   ? PT Start Time 1005   ? PT Stop Time 1100  DN  ? PT Time Calculation (min) 55 min   ? Activity Tolerance Patient tolerated treatment well   ? ?  ?  ? ?  ? ? ?Past Medical History:  ?Diagnosis Date  ? Anxiety   ? GERD (gastroesophageal reflux disease)   ? Headache, cluster   ? Hyperlipidemia   ? a. Mild - statin started 02/2014.  ? Hypothyroidism   ? Non-obstructive CAD   ? a. 02/2014 Cath: LM nl, LAD 75md, LCX nl, RCA 20p/m, EF 55-60%-->Med Rx.  ? Palpitations   ? none recently  ? Sleep apnea   ? uses cpap  ? ?Past Surgical History:  ?Procedure Laterality Date  ? COLONOSCOPY N/A 07/30/2013  ? Procedure: COLONOSCOPY;  Surgeon: PBeryle Beams MD;  [facility]: [facility];  Service: Endoscopy;  Laterality: N/A;  ? LEFT HEART CATH AND CORONARY ANGIOGRAPHY N/A 06/23/2019  ? Procedure: LEFT HEART CATH AND CORONARY ANGIOGRAPHY;  Surgeon: CSherren Mocha MD;  [facility];  Service: Cardiovascular;  Laterality: N/A;  ? LEFT HEART CATHETERIZATION WITH CORONARY ANGIOGRAM N/A 03/08/2014  ? Procedure: LEFT HEART CATHETERIZATION WITH CORONARY ANGIOGRAM;  Surgeon: CBurnell Blanks MD;  [facility];  Service: Cardiovascular;  Laterality: N/A;  ? TONSILLECTOMY AND ADENOIDECTOMY    ? as a child  ? ?Patient Active Problem List  ? Diagnosis Date Noted  ? Spondylolisthesis of lumbosacral region 10/19/2020  ? Chronic tension-type headache, not intractable 06/23/2020  ? Hyperlipidemia   ? GERD (gastroesophageal reflux disease)   ? Excessive daytime sleepiness 01/12/2018  ? Seasonal and perennial allergic rhinitis 01/12/2018  ? CAD (coronary artery disease) 03/25/2014   ? Pain of left calf 03/25/2014  ? Morbid obesity ([place] 03/09/2014  ? Midsternal chest pain 03/09/2014  ? Unstable angina ([place] 03/08/2014  ? Hypothyroidism 03/08/2014  ? Chest pain   ? OSA (obstructive sleep apnea) 07/27/2012  ? ? ?PCP: WChristain Sacramento MD ? ?REFERRING PROVIDER: WChristain Sacramento MD ? ?REFERRING DIAG: Z98.1 (ICD-10-CM) - Arthrodesis status ? ?THERAPY DIAG:  ?Low back pain, unspecified back pain laterality, unspecified chronicity, unspecified whether sciatica present ? ?Muscle spasm of back ? ?Muscle weakness (generalized) ? ?ONSET DATE: 10/19/2020 ? ?SUBJECTIVE:                                                                                                                                                                                          ? ?  SUBJECTIVE STATEMENT: [place] but I did it every day 1-1.5.   Less frequent accidents.  Hot spot tightness in right thigh.  Wife having partial TKR on Monday so I had to cancel appt.   ? ?PERTINENT HISTORY:  ?Back surgery on 10/19/2020, anxiety, GERD, L heart cath; ? ?PAIN:     ?Are you having pain? Yes:  0/10  Tightness Lumbar.  "Just tightness"  ? ? ?PATIENT GOALS:  improve sleep, improve pain to allow him to return to work  ? ? ?OBJECTIVE:  ? ?PATIENT SURVEYS:  ? ?Modified Oswestry 22/50 =44% Disability on 06/22/2021  ?FOTO 47% on 07/02/2021 ? ?SCREENING FOR RED FLAGS: ? ?07/02/2021: ?Bowel or bladder incontinence: Yes: Pt reports has not had any since last week, had 3-4 boughts of incontinence last week. ? ? ?COGNITION: ? Overall cognitive status: Within functional limits for tasks assessed   ?  ? ?LE MMT: ? ?MMT Right ?06/22/2021 Left ?06/22/2021  ?Hip flexion 4- 4-  ?Hip extension    ?Hip abduction 4- 4-  ?Hip adduction    ?Hip internal rotation    ?Hip external rotation    ?Knee flexion    ?Knee extension    ?Lumbar Flexion 4- 4-  ?Lumbar Extension 4- 4-  ?Ankle inversion    ?Ankle eversion    ? (Blank rows = not  tested) ? ?FUNCTIONAL TESTS:  ? ?07/30/2021:  5 times sit to stand: 21.5 sec with UE on thighs ? ?06/11/2021: ?5 times sit to stand: 18.9 sec with UE on thighs ? ? ?TODAY'S TREATMENT  ?4/28: ?Nustep Level 3 x8 min with PT present to discuss status ?Standing with foot on step:  Hamstring and hip flexion stretch 2x20 sec bilat  ?Pelvic tilts against the wall 10x ?Wall slides 10x  ?Lat Pull 50# 2x10 reps ?QL hike on blue foam 10x each side ? ?DRY NEEDLING: ?Dry needling consent given? yes ?Educational handouts provided? Previously  ?Muscles treated: bil lumbar multifidi, gluteals ?Response from dry needling: improved soft tissue mobility and pain  ?E-stim with DN: 2.0 ma 10 min  ? ?07/30/2021: ?Nustep Level 5 x6 min with PT present to discuss status ?5 times sit to stand: 21.5 sec with UE on thighs ?Seated pelvic tilt on dynadisc:  4 way, clockwise and counterclockwise x10 each ?Seated piriformis stretch x20 sec bilat ?Sit to/from stand sitting on 2 blue foam mats holding 5# kettlebell:  x10 with chest press, x10 overhead press ?Prone:  HS curl x10 bilat, hip extension x10 bilat ?Seated 3 way green pball rollout 5x10 sec each ?Wall push ups 2x10 with cuing for core stability ? ?07/27/21: ?Nustep Level 5 x11 min with PT present to discuss status ?Standing with foot on step:  Hamstring and hip flexion stretch 2x20 sec bilat  ?Lat Pull 45# 2x10 reps ?Seated pelvic tilt on dynadisc:  4 way, clockwise and counterclockwise x10 each ?QL hike on blue foam 10x each side ?Moist heat 5 min ?DRY NEEDLING: ?Dry needling consent given? yes ?Educational handouts provided? Previously  ?Muscles treated: bil lumbar multifidi, gluteals ?Response from dry needling: improved soft tissue mobility and pain  ?E-stim with DN: 2.0 ma 15 min  ? ?07/20/2021: ?Nustep Level 5 x11 min with PT present to discuss status ?Neuroscience of pain education ?Seated pelvic tilt on dynadisc:  4 way, clockwise and counterclockwise x10 each ?Moist heat 5 min ?DRY  NEEDLING: ?Dry needling consent given? yes ?Educational handouts provided? Previously  ?Muscles treated:  bil lumbar multifidi, gluteals ?Response from dry needling: improved soft tissue mobility and pain  ?E-stim with DN: 2.0 ma 15 min  ? ? ?PATIENT EDUCATION:  ?Education details: Reviewed HEP and importance of meditation ?Person educated: Patient ?Education method: Explanation ?Education comprehension: verbalized understanding ? ? ?HOME EXERCISE PROGRAM: ?Access Code: [email or web address] ?URL: [email or web address] ?Date: 07/02/2021 ?Prepared by: Juel Burrow ? ?Exercises ?Seated Pelvic Floor Contraction - 1-2 x daily - 7 x weekly - 1 sets - 10 reps ?Supine Transversus Abdominis Bracing - Hands on Stomach - 1 x daily - 7 x weekly - 2-3 sets - 10 reps ?Supine March with Posterior Pelvic Tilt - 1-2 x daily - 7 x weekly - 1 sets - 10 reps ?Hooklying Sequential Leg March and Lower - 1 x daily - 7 x weekly - 2-3 sets - 10 reps ?Hooklying Hamstring Stretch with Strap - 2 x daily - 7 x weekly - 1 sets - 3 reps - 30 sec hold ?Supine Piriformis Stretch with Foot on Ground - 1-2 x daily - 7 x weekly - 1 sets - 2 reps - 20 sec hold ?Supine Bridge - 1-2 x daily - 7 x weekly - 1 sets - 10 reps ?Standing Sports administrator with Table and Chair Support - 2 x daily - 7 x weekly - 1 sets - 3 reps - 30 sec  ? ? ?ASSESSMENT: ? ?CLINICAL IMPRESSION:   ?Much improved pain level today and standing much more erect today with no antalgic gait.  Able to perform moderate intensity core strengthening without exacerbation.  Good response to DN combined with ES.  Therapist monitoring response to all interventions and modifying treatment accordingly.   ? ?OBJECTIVE IMPAIRMENTS Abnormal gait, decreased activity tolerance, decreased mobility, decreased ROM, decreased strength, hypomobility, increased muscle spasms, impaired flexibility, improper body mechanics, postural dysfunction, and pain.  ? ?ACTIVITY LIMITATIONS cleaning and occupation.   ? ?PERSONAL FACTORS Age, Time since onset of injury/illness/exacerbation, and 1 comorbidity: surgery 10/19/2020  are also affecting patient's functional outcome.  ? ? ?REHAB POTENTIAL: Good ? ?CLINICAL DECISION MAKI

## 2021-08-13 ENCOUNTER — Encounter: Payer: BC Managed Care – PPO | Admitting: Rehabilitative and Restorative Service Providers"

## 2021-08-17 ENCOUNTER — Ambulatory Visit: Payer: BC Managed Care – PPO | Admitting: Rehabilitative and Restorative Service Providers"

## 2021-08-20 ENCOUNTER — Ambulatory Visit: Payer: BC Managed Care – PPO | Admitting: Rehabilitative and Restorative Service Providers"

## 2021-08-24 ENCOUNTER — Ambulatory Visit: Payer: BC Managed Care – PPO | Attending: Student | Admitting: Physical Therapy

## 2021-08-24 DIAGNOSIS — M62830 Muscle spasm of back: Secondary | ICD-10-CM

## 2021-08-24 DIAGNOSIS — M6281 Muscle weakness (generalized): Secondary | ICD-10-CM

## 2021-08-24 DIAGNOSIS — M545 Low back pain, unspecified: Secondary | ICD-10-CM

## 2021-08-24 NOTE — Therapy (Signed)
?OUTPATIENT PHYSICAL THERAPY THORACOLUMBAR TREATMENT ? ? ?Patient Name: Jerry Keller ?MRN: [id number] ?DOB:21-Jan-1961, 61 y.o., male ?Today's Date: 08/24/2021 ? ? PT End of Session - 08/10/21 1006   ? ? Visit Number 35   ? Date for PT Re-Evaluation 10/19/21  ? Authorization Type BCBS   ? Authorization Time Period 04/15/2021 - 04/14/2022   ? PT Start Time 1014  ? PT Stop Time 1108  DN  ? PT Time Calculation (min) 55 min   ? Activity Tolerance Patient tolerated treatment well   ? ?  ?  ? ?  ? ? ?Past Medical History:  ?Diagnosis Date  ? Anxiety   ? GERD (gastroesophageal reflux disease)   ? Headache, cluster   ? Hyperlipidemia   ? a. Mild - statin started 02/2014.  ? Hypothyroidism   ? Non-obstructive CAD   ? a. 02/2014 Cath: LM nl, LAD 82md, LCX nl, RCA 20p/m, EF 55-60%-->Med Rx.  ? Palpitations   ? none recently  ? Sleep apnea   ? uses cpap  ? ?Past Surgical History:  ?Procedure Laterality Date  ? COLONOSCOPY N/A 07/30/2013  ? Procedure: COLONOSCOPY;  Surgeon: PBeryle Beams MD;  Location: [facility];  Service: Endoscopy;  Laterality: N/A;  ? LEFT HEART CATH AND CORONARY ANGIOGRAPHY N/A 06/23/2019  ? Procedure: LEFT HEART CATH AND CORONARY ANGIOGRAPHY;  Surgeon: CSherren Mocha MD;  [facility];  Service: Cardiovascular;  Laterality: N/A;  ? LEFT HEART CATHETERIZATION WITH CORONARY ANGIOGRAM N/A 03/08/2014  ? Procedure: LEFT HEART CATHETERIZATION WITH CORONARY ANGIOGRAM;  Surgeon: CBurnell Blanks MD;  [facility];  Service: Cardiovascular;  Laterality: N/A;  ? TONSILLECTOMY AND ADENOIDECTOMY    ? as a child  ? ?Patient Active Problem List  ? Diagnosis Date Noted  ? Spondylolisthesis of lumbosacral region 10/19/2020  ? Chronic tension-type headache, not intractable 06/23/2020  ? Hyperlipidemia   ? GERD (gastroesophageal reflux disease)   ? Excessive daytime sleepiness 01/12/2018  ? Seasonal and perennial allergic rhinitis 01/12/2018  ? CAD (coronary artery disease) 03/25/2014  ?  Pain of left calf 03/25/2014  ? Morbid obesity ([place] 03/09/2014  ? Midsternal chest pain 03/09/2014  ? Unstable angina ([facility] 03/08/2014  ? Hypothyroidism 03/08/2014  ? Chest pain   ? OSA (obstructive sleep apnea) 07/27/2012  ? ? ?PCP: WChristain Sacramento MD ? ?REFERRING PROVIDER: BPatricia Nettle NP ? ?REFERRING DIAG: Z98.1 (ICD-10-CM) - Arthrodesis status ? ?THERAPY DIAG:  ?Low back pain, unspecified back pain laterality, unspecified chronicity, unspecified whether sciatica present ? ?Muscle spasm of back ? ?Muscle weakness (generalized) ? ?ONSET DATE: 10/19/2020 ? ?SUBJECTIVE:                                                                                                                                                                                          ? ?  SUBJECTIVE STATEMENT: I'm still strengthening my pelvic floor.  I've had no accidents since I was here last.  Lower back pain since working more "on the floor" at work.  I've worked some 15 hour days.  I've lost 6 pounds since I was here last time.   ? ?PERTINENT HISTORY:  ?Back surgery on 10/19/2020, anxiety, GERD, L heart cath; ? ?PAIN:     ?Are you having pain? Yes:  2-3/10  low back ? ? ?PATIENT GOALS:  improve sleep, improve pain to allow him to return to work  ? ? ?OBJECTIVE:  ? ?PATIENT SURVEYS:  ? ?Modified Oswestry 22/50 =44% Disability on 06/22/2021  ?FOTO 47% on 07/02/2021 ? ?Oswestry 5/12:  26%  ? ?SCREENING FOR RED FLAGS: ? ?07/02/2021: ?Bowel or bladder incontinence: Yes: Pt reports has not had any since last week, had 3-4 boughts of incontinence last week. ? ?5/12:  No recent incontinence in last 2 weeks ?COGNITION: ? Overall cognitive status: Within functional limits for tasks assessed   ?  ? ?LE MMT: ? ?MMT Right ?06/22/2021 5/12 Left ?06/22/2021 5/12  ?Hip flexion 4- 4 4- 4  ?Hip extension      ?Hip abduction 4- 4 4- 4  ?Hip adduction      ?Hip internal rotation      ?Hip external rotation      ?Knee flexion      ?Knee extension      ?Lumbar  Flexion 4- 4 4- 4  ?Lumbar Extension 4- 4 4- 4  ?Ankle inversion      ?Ankle eversion      ? (Blank rows = not tested) ? ?FUNCTIONAL TESTS:  ?08/24/21:  5x sit to stand chair plus 1 cushion: >30 sec today min use on thighs with difficulty stabilizing initially ?07/30/2021:  5 times sit to stand: 21.5 sec with UE on thighs ? ?06/11/2021: ?5 times sit to stand: 18.9 sec with UE on thighs ? ? ?TODAY'S TREATMENT  ?5/12: ?Nustep Level 3 x8 min with PT present to discuss status ?Standing with foot on step:  Hamstring and hip flexion stretch 2x20 sec bilat  ?Blue ball roll outs 5x  ?QL hike on 2 inch step 10x each side ?DRY NEEDLING: ?Dry needling consent given? yes ?Educational handouts provided? Previously  ?Muscles treated: bil lumbar multifidi ?Response from dry needling: improved soft tissue mobility and pain  ?E-stim with DN: 3.0 ma 10 min  ? ?4/28: ?Nustep Level 3 x8 min with PT present to discuss status ?Standing with foot on step:  Hamstring and hip flexion stretch 2x20 sec bilat  ?Pelvic tilts against the wall 10x ?Wall slides 10x  ?Lat Pull 50# 2x10 reps ?QL hike on blue foam 10x each side ? ?DRY NEEDLING: ?Dry needling consent given? yes ?Educational handouts provided? Previously  ?Muscles treated: bil lumbar multifidi, gluteals ?Response from dry needling: improved soft tissue mobility and pain  ?E-stim with DN: 2.0 ma 10 min  ? ?07/30/2021: ?Nustep Level 5 x6 min with PT present to discuss status ?5 times sit to stand: 21.5 sec with UE on thighs ?Seated pelvic tilt on dynadisc:  4 way, clockwise and counterclockwise x10 each ?Seated piriformis stretch x20 sec bilat ?Sit to/from stand sitting on 2 blue foam mats holding 5# kettlebell:  x10 with chest press, x10 overhead press ?Prone:  HS curl x10 bilat, hip extension x10 bilat ?Seated 3 way green pball rollout 5x10 sec each ?Wall push ups 2x10 with cuing for core stability ? ?07/27/21: ?Nustep Level 5  x11 min with PT present to discuss status ?Standing with foot on  step:  Hamstring and hip flexion stretch 2x20 sec bilat  ?Lat Pull 45# 2x10 reps ?Seated pelvic tilt on dynadisc:  4 way, clockwise and counterclockwise x10 each ?QL hike on blue foam 10x each side ?Moist heat 5 min ?DRY NEEDLING: ?Dry needling consent given? yes ?Educational handouts provided? Previously  ?Muscles treated: bil lumbar multifidi, gluteals ?Response from dry needling: improved soft tissue mobility and pain  ?E-stim with DN: 2.0 ma 15 min  ? ?07/20/2021: ?Nustep Level 5 x11 min with PT present to discuss status ?Neuroscience of pain education ?Seated pelvic tilt on dynadisc:  4 way, clockwise and counterclockwise x10 each ?Moist heat 5 min ?DRY NEEDLING: ?Dry needling consent given? yes ?Educational handouts provided? Previously  ?Muscles treated: bil lumbar multifidi, gluteals ?Response from dry needling: improved soft tissue mobility and pain  ?E-stim with DN: 2.0 ma 15 min  ? ? ?PATIENT EDUCATION:  ?Education details: Reviewed HEP and importance of meditation ?Person educated: Patient ?Education method: Explanation ?Education comprehension: verbalized understanding ? ? ?HOME EXERCISE PROGRAM: ?Access Code: [email or web address] ?URL: [email or web address] ?Date: 07/02/2021 ?Prepared by: Juel Burrow ? ?Exercises ?Seated Pelvic Floor Contraction - 1-2 x daily - 7 x weekly - 1 sets - 10 reps ?Supine Transversus Abdominis Bracing - Hands on Stomach - 1 x daily - 7 x weekly - 2-3 sets - 10 reps ?Supine March with Posterior Pelvic Tilt - 1-2 x daily - 7 x weekly - 1 sets - 10 reps ?Hooklying Sequential Leg March and Lower - 1 x daily - 7 x weekly - 2-3 sets - 10 reps ?Hooklying Hamstring Stretch with Strap - 2 x daily - 7 x weekly - 1 sets - 3 reps - 30 sec hold ?Supine Piriformis Stretch with Foot on Ground - 1-2 x daily - 7 x weekly - 1 sets - 2 reps - 20 sec hold ?Supine Bridge - 1-2 x daily - 7 x weekly - 1 sets - 10 reps ?Standing Sports administrator with Table and Chair Support - 2 x daily - 7 x weekly -  1 sets - 3 reps - 30 sec  ? ? ?ASSESSMENT: ? ?CLINICAL IMPRESSION:   ?Significant improvement in Oswestry score from 44% in March to 26% today.  Progressing with rehab goals. Still with decreased core and L

## 2021-08-27 ENCOUNTER — Encounter: Payer: BC Managed Care – PPO | Admitting: Rehabilitative and Restorative Service Providers"

## 2021-08-31 ENCOUNTER — Ambulatory Visit: Payer: BC Managed Care – PPO | Admitting: Physical Therapy

## 2021-08-31 DIAGNOSIS — M6281 Muscle weakness (generalized): Secondary | ICD-10-CM

## 2021-08-31 DIAGNOSIS — M62830 Muscle spasm of back: Secondary | ICD-10-CM

## 2021-08-31 DIAGNOSIS — M545 Low back pain, unspecified: Secondary | ICD-10-CM

## 2021-08-31 NOTE — Therapy (Signed)
OUTPATIENT PHYSICAL THERAPY THORACOLUMBAR TREATMENT   Patient Name: Jerry Keller MRN: [id number] DOB:04-14-1961, 61 y.o., male Today's Date: 08/31/2021   PT End of Session - 08/10/21 1006     Visit Number 37   Date for PT Re-Evaluation 10/19/21   Authorization Type BCBS    Authorization Time Period 04/15/2021 - 04/14/2022    PT Start Time 1010   PT Stop Time 1100     PT Time Calculation (min) 50 min    Activity Tolerance Patient tolerated treatment well             Past Medical History:  Diagnosis Date   Anxiety    GERD (gastroesophageal reflux disease)    Headache, cluster    Hyperlipidemia    a. Mild - statin started 02/2014.   Hypothyroidism    Non-obstructive CAD    a. 02/2014 Cath: LM nl, LAD 79md, LCX nl, RCA 20p/m, EF 55-60%-->Med Rx.   Palpitations    none recently   Sleep apnea    uses cpap   Past Surgical History:  Procedure Laterality Date   COLONOSCOPY N/A 07/30/2013   Procedure: COLONOSCOPY;  Surgeon: PBeryle Beams MD;  Location: [facility];  Service: Endoscopy;  Laterality: N/A;   LEFT HEART CATH AND CORONARY ANGIOGRAPHY N/A 06/23/2019   Procedure: LEFT HEART CATH AND CORONARY ANGIOGRAPHY;  Surgeon: CSherren Mocha MD;  [facility];  Service: Cardiovascular;  Laterality: N/A;   LEFT HEART CATHETERIZATION WITH CORONARY ANGIOGRAM N/A 03/08/2014   Procedure: LEFT HEART CATHETERIZATION WITH CORONARY ANGIOGRAM;  Surgeon: CBurnell Blanks MD;  [facility];  Service: Cardiovascular;  Laterality: N/A;   TONSILLECTOMY AND ADENOIDECTOMY     as a child   Patient Active Problem List   Diagnosis Date Noted   Spondylolisthesis of lumbosacral region 10/19/2020   Chronic tension-type headache, not intractable 06/23/2020   Hyperlipidemia    GERD (gastroesophageal reflux disease)    Excessive daytime sleepiness 01/12/2018   Seasonal and perennial allergic rhinitis 01/12/2018   CAD (coronary artery disease) 03/25/2014   Pain  of left calf 03/25/2014   Morbid obesity ([place] 03/09/2014   Midsternal chest pain 03/09/2014   Unstable angina ([place] 03/08/2014   Hypothyroidism 03/08/2014   Chest pain    OSA (obstructive sleep apnea) 07/27/2012    PCP: WChristain Sacramento MD  REFERRING PROVIDER: BPatricia Nettle NP  REFERRING DIAG: Z98.1 (ICD-10-CM) - Arthrodesis status  THERAPY DIAG:  Low back pain, unspecified back pain laterality, unspecified chronicity, unspecified whether sciatica present  Muscle spasm of back  Muscle weakness (generalized)  ONSET DATE: 10/19/2020  SUBJECTIVE:  SUBJECTIVE STATEMENT:   I've been riding the bike my wife got for Mother's Day.  I think I'm ready to go to every other week of PT.  I mowed (riding mower) for the first time since surgery and had an accident but found out it was missing the padding under the seat.   PERTINENT HISTORY:  Back surgery on 10/19/2020, anxiety, GERD, L heart cath;  PAIN:     Are you having pain? Yes:  1-2/10  low back   PATIENT GOALS:  improve sleep, improve pain to allow him to return to work    OBJECTIVE:   PATIENT SURVEYS:   Modified Oswestry 22/50 =44% Disability on 06/22/2021  FOTO 47% on 07/02/2021 FOTO 60% on 5/19  Oswestry 5/12:  26%   SCREENING FOR RED FLAGS:  07/02/2021: Bowel or bladder incontinence: Yes: Pt reports has not had any since last week, had 3-4 boughts of incontinence last week.  5/12:  No recent incontinence in last 2 weeks COGNITION:  Overall cognitive status: Within functional limits for tasks assessed      LE MMT:  MMT Right 06/22/2021 5/12 Left 06/22/2021 5/12  Hip flexion 4- 4 4- 4  Hip extension      Hip abduction 4- 4 4- 4  Hip adduction      Hip internal rotation      Hip external rotation      Knee flexion      Knee  extension      Lumbar Flexion 4- 4 4- 4  Lumbar Extension 4- 4 4- 4  Ankle inversion      Ankle eversion       (Blank rows = not tested)  FUNCTIONAL TESTS:  08/24/21:  5x sit to stand chair plus 1 cushion: >30 sec today min use on thighs with difficulty stabilizing initially 07/30/2021:  5 times sit to stand: 21.5 sec with UE on thighs  06/11/2021: 5 times sit to stand: 18.9 sec with UE on thighs   TODAY'S TREATMENT  5/19: Nustep Level 5 x6 min with PT present to discuss status Calf stretch on rocker board 3x 20 sec  Seated lat pull downs 40# Blue foam QL hikes 15x each side Wall slides 20x  Seated pelvic tilt on dynadisc:  4 way, clockwise and counterclockwise x10 each Sit to/from stand sitting on BOSU 15# kettlebell 20x Seated 3 way green pball rollout 5x10 sec each   5/12: Nustep Level 3 x8 min with PT present to discuss status Standing with foot on step:  Hamstring and hip flexion stretch 2x20 sec bilat  Blue ball roll outs 5x  QL hike on 2 inch step 10x each side DRY NEEDLING: Dry needling consent given? yes Educational handouts provided? Previously  Muscles treated: bil lumbar multifidi Response from dry needling: improved soft tissue mobility and pain  E-stim with DN: 3.0 ma 10 min   4/28: Nustep Level 3 x8 min with PT present to discuss status Standing with foot on step:  Hamstring and hip flexion stretch 2x20 sec bilat  Pelvic tilts against the wall 10x Wall slides 10x  Lat Pull 50# 2x10 reps QL hike on blue foam 10x each side  DRY NEEDLING: Dry needling consent given? yes Educational handouts provided? Previously  Muscles treated: bil lumbar multifidi, gluteals Response from dry needling: improved soft tissue mobility and pain  E-stim with DN: 2.0 ma 10 min   07/30/2021: Nustep Level 5 x6 min with PT present to discuss status 5 times sit to  stand: 21.5 sec with UE on thighs Seated pelvic tilt on dynadisc:  4 way, clockwise and counterclockwise x10  each Seated piriformis stretch x20 sec bilat Sit to/from stand sitting on 2 blue foam mats holding 5# kettlebell:  x10 with chest press, x10 overhead press Prone:  HS curl x10 bilat, hip extension x10 bilat Seated 3 way green pball rollout 5x10 sec each Wall push ups 2x10 with cuing for core stability  07/27/21: Nustep Level 5 x11 min with PT present to discuss status Standing with foot on step:  Hamstring and hip flexion stretch 2x20 sec bilat  Lat Pull 45# 2x10 reps Seated pelvic tilt on dynadisc:  4 way, clockwise and counterclockwise x10 each QL hike on blue foam 10x each side Moist heat 5 min DRY NEEDLING: Dry needling consent given? yes Educational handouts provided? Previously  Muscles treated: bil lumbar multifidi, gluteals Response from dry needling: improved soft tissue mobility and pain  E-stim with DN: 2.0 ma 15 min   07/20/2021: Nustep Level 5 x11 min with PT present to discuss status Neuroscience of pain education Seated pelvic tilt on dynadisc:  4 way, clockwise and counterclockwise x10 each Moist heat 5 min DRY NEEDLING: Dry needling consent given? yes Educational handouts provided? Previously  Muscles treated: bil lumbar multifidi, gluteals Response from dry needling: improved soft tissue mobility and pain  E-stim with DN: 2.0 ma 15 min    PATIENT EDUCATION:  Education details: Reviewed HEP and importance of meditation Person educated: Patient Education method: Explanation Education comprehension: verbalized understanding   HOME EXERCISE PROGRAM: Access Code: [email or web address] URL: [email or web address] Date: 07/02/2021 Prepared by: Juel Burrow  Exercises Seated Pelvic Floor Contraction - 1-2 x daily - 7 x weekly - 1 sets - 10 reps Supine Transversus Abdominis Bracing - Hands on Stomach - 1 x daily - 7 x weekly - 2-3 sets - 10 reps Supine March with Posterior Pelvic Tilt - 1-2 x daily - 7 x weekly - 1 sets - 10 reps Hooklying Sequential Leg  March and Lower - 1 x daily - 7 x weekly - 2-3 sets - 10 reps Hooklying Hamstring Stretch with Strap - 2 x daily - 7 x weekly - 1 sets - 3 reps - 30 sec hold Supine Piriformis Stretch with Foot on Ground - 1-2 x daily - 7 x weekly - 1 sets - 2 reps - 20 sec hold Supine Bridge - 1-2 x daily - 7 x weekly - 1 sets - 10 reps Standing Quad Stretch with Table and Chair Support - 2 x daily - 7 x weekly - 1 sets - 3 reps - 30 sec    ASSESSMENT:  CLINICAL IMPRESSION:   Significant improvement in FOTO score to 60% compared to last score in March.  He is able to perform moderate intensity ex's without pain exacerbation.  Dry needling and manual therapy not needed today.  Therapist monitoring response throughout session.    OBJECTIVE IMPAIRMENTS Abnormal gait, decreased activity tolerance, decreased mobility, decreased ROM, decreased strength, hypomobility, increased muscle spasms, impaired flexibility, improper body mechanics, postural dysfunction, and pain.   ACTIVITY LIMITATIONS cleaning and occupation.   PERSONAL FACTORS Age, Time since onset of injury/illness/exacerbation, and 1 comorbidity: surgery 10/19/2020  are also affecting patient's functional outcome.    REHAB POTENTIAL: Good  CLINICAL DECISION MAKING: Evolving/moderate complexity  EVALUATION COMPLEXITY: Moderate   GOALS: Goals reviewed with patient? Yes  SHORT TERM GOALS: Target date: 02/19/2022  Pt will be independent with his initial HEP to  improve flexibility and understanding of proper body mechanics. Baseline:  Goal status: MET   LONG TERM GOALS: Target date: 10/26/2021  Pt will have atleast 4/5 MMT of BLE which will improve his efficiency with walking and other ADLs. Baseline:  Goal status: IN PROGRESS  2.  Pt will report atleast 80% improvement in his back pain and function from the start of PT which will improve his ability to participate in work related tasks. Baseline:  Goal status: IN PROGRESS  3.  Pt will be  able to complete 5x sit to stand with min to mod UE assist in less than 20 sec to reflect improvements in his trunk/LE strength. Baseline:  Goal status: MET  4.  Pt will be able to complete bed mobility such as rolling and supine to/from sit without the need for UE support or increase in low back pain. Baseline:  Goal status: MET  5.  The patient will have LE strength needed to rise 1x from a standard height chair with min UE assist. Baseline:  Goal status: MET  6.  Pt to decrease Modified Oswestry Index to 20% or less to demonstrate decreased functional back pain. Baseline:  Goal status: IN PROGRESS 7.  Able to lift 25# object with good form with min pain. Baseline: Goal status:  IN PROGRESS    PLAN: PT FREQUENCY: 1x/week  PT DURATION: 8 weeks  PLANNED INTERVENTIONS: Therapeutic exercises, Therapeutic activity, Neuromuscular re-education, Balance training, Gait training, Patient/Family education, Joint mobilization, Dry Needling, Cryotherapy, Moist heat, Taping, and Manual therapy.  PLAN FOR NEXT SESSION:  decrease frequency to 1x/week or every other week;  strengthening, core stability, modalities and manual therapy, DN/ES as needed  Ruben Im, PT 08/31/21 10:54 AM Phone: [phone] Fax: [facility] [address] [place] [address] [place], [place] [address] Phone # [phone] Fax [phone]

## 2021-09-07 ENCOUNTER — Ambulatory Visit: Payer: BC Managed Care – PPO | Admitting: Physical Therapy

## 2021-09-07 DIAGNOSIS — M545 Low back pain, unspecified: Secondary | ICD-10-CM | POA: Diagnosis not present

## 2021-09-07 DIAGNOSIS — M62830 Muscle spasm of back: Secondary | ICD-10-CM

## 2021-09-07 DIAGNOSIS — M6281 Muscle weakness (generalized): Secondary | ICD-10-CM

## 2021-09-07 NOTE — Therapy (Signed)
OUTPATIENT PHYSICAL THERAPY THORACOLUMBAR TREATMENT   Patient Name: Jerry Keller MRN: [id number] DOB:1961/04/06, 61 y.o., male Today's Date: 09/07/2021   PT End of Session - 08/10/21 1006     Visit Number 55   Date for PT Re-Evaluation 10/19/21   Authorization Type BCBS    Authorization Time Period 04/15/2021 - 04/14/2022    PT Start Time 1101   PT Stop Time 1155   PT Time Calculation (min) 54 min DN    Activity Tolerance Patient tolerated treatment well             Past Medical History:  Diagnosis Date   Anxiety    GERD (gastroesophageal reflux disease)    Headache, cluster    Hyperlipidemia    a. Mild - statin started 02/2014.   Hypothyroidism    Non-obstructive CAD    a. 02/2014 Cath: LM nl, LAD 32md, LCX nl, RCA 20p/m, EF 55-60%-->Med Rx.   Palpitations    none recently   Sleep apnea    uses cpap   Past Surgical History:  Procedure Laterality Date   COLONOSCOPY N/A 07/30/2013   Procedure: COLONOSCOPY;  Surgeon: PBeryle Beams MD;  Location: [facility];  Service: Endoscopy;  Laterality: N/A;   LEFT HEART CATH AND CORONARY ANGIOGRAPHY N/A 06/23/2019   Procedure: LEFT HEART CATH AND CORONARY ANGIOGRAPHY;  Surgeon: CSherren Mocha MD;  [facility];  Service: Cardiovascular;  Laterality: N/A;   LEFT HEART CATHETERIZATION WITH CORONARY ANGIOGRAM N/A 03/08/2014   Procedure: LEFT HEART CATHETERIZATION WITH CORONARY ANGIOGRAM;  Surgeon: CBurnell Blanks MD;  [facility];  Service: Cardiovascular;  Laterality: N/A;   TONSILLECTOMY AND ADENOIDECTOMY     as a child   Patient Active Problem List   Diagnosis Date Noted   Spondylolisthesis of lumbosacral region 10/19/2020   Chronic tension-type headache, not intractable 06/23/2020   Hyperlipidemia    GERD (gastroesophageal reflux disease)    Excessive daytime sleepiness 01/12/2018   Seasonal and perennial allergic rhinitis 01/12/2018   CAD (coronary artery disease) 03/25/2014    Pain of left calf 03/25/2014   Morbid [place] ([place] 03/09/2014   Midsternal chest pain 03/09/2014   [place] ([place] 03/08/2014   Hypothyroidism 03/08/2014   Chest pain    OSA (obstructive sleep apnea) 07/27/2012    PCP: WChristain Sacramento MD  REFERRING PROVIDER: BPatricia Nettle NP  REFERRING DIAG: Z98.1 (ICD-10-CM) - Arthrodesis status  THERAPY DIAG:  Low back pain, unspecified back pain laterality, unspecified chronicity, unspecified whether sciatica present  Muscle spasm of back  Muscle weakness (generalized)  ONSET DATE: 10/19/2020  SUBJECTIVE:  SUBJECTIVE STATEMENT:   I've had a bad week of spasms.  It hits me out of the blue twice.  The 2nd time it happened I had an accident.  I've been more on the floor at work.  I'm on my knees and climbing under machines.  I'm worried about walking my daughter down the aisle this weekend when she gets married.  Clarise Cruz at the [place] office gave me a pamphlet on a spinal cord stimulator.   PERTINENT HISTORY:  Back surgery on 10/19/2020, anxiety, GERD, L heart cath;  PAIN:     Are you having pain? Yes:  5-6/10  low back   PATIENT GOALS:  improve sleep, improve pain to allow him to return to work    OBJECTIVE:   PATIENT SURVEYS:   Modified Oswestry 22/50 =44% Disability on 06/22/2021  FOTO 47% on 07/02/2021 FOTO 60% on 5/19  Oswestry 5/12:  26%   SCREENING FOR RED FLAGS:  07/02/2021: Bowel or bladder incontinence: Yes: Pt reports has not had any since last week, had 3-4 boughts of incontinence last week.  5/12:  No recent incontinence in last 2 weeks COGNITION:  Overall cognitive status: Within functional limits for tasks assessed      LE MMT:  MMT Right 06/22/2021 5/12 Left 06/22/2021 5/12  Hip flexion 4- 4 4- 4  Hip extension       Hip abduction 4- 4 4- 4  Hip adduction      Hip internal rotation      Hip external rotation      Knee flexion      Knee extension      Lumbar Flexion 4- 4 4- 4  Lumbar Extension 4- 4 4- 4  Ankle inversion      Ankle eversion       (Blank rows = not tested)  FUNCTIONAL TESTS:  08/24/21:  5x sit to stand chair plus 1 cushion: >30 sec today min use on thighs with difficulty stabilizing initially 07/30/2021:  5 times sit to stand: 21.5 sec with UE on thighs  06/11/2021: 5 times sit to stand: 18.9 sec with UE on thighs   TODAY'S TREATMENT   5/26: Nustep Level 5 x6 min with PT present to discuss status Seated pelvic tilt on dynadisc:  4 way, clockwise and counterclockwise x10 each Standing with foot on step:   hip flexion stretch 2x20 sec bilat    DRY NEEDLING: Dry needling consent given? yes Educational handouts provided? Previously  Muscles treated: bil lumbar multifidi, gluteals Response from dry needling: improved soft tissue mobility and pain  E-stim with DN: 3.0 ma 15 min frequency lowered to 60 from 80   5/19: Nustep Level 5 x6 min with PT present to discuss status Calf stretch on rocker board 3x 20 sec  Seated lat pull downs 40# Blue foam QL hikes 15x each side Wall slides 20x  Seated pelvic tilt on dynadisc:  4 way, clockwise and counterclockwise x10 each Sit to/from stand sitting on BOSU 15# kettlebell 20x Seated 3 way green pball rollout 5x10 sec each   5/12: Nustep Level 3 x8 min with PT present to discuss status Standing with foot on step:  Hamstring and hip flexion stretch 2x20 sec bilat  Blue ball roll outs 5x  QL hike on 2 inch step 10x each side DRY NEEDLING: Dry needling consent given? yes Educational handouts provided? Previously  Muscles treated: bil lumbar multifidi Response from dry needling: improved soft tissue mobility and pain  E-stim with DN:  3.0 ma 10 min   4/28: Nustep Level 3 x8 min with PT present to discuss status Standing with  foot on step:  Hamstring and hip flexion stretch 2x20 sec bilat  Pelvic tilts against the wall 10x Wall slides 10x  Lat Pull 50# 2x10 reps QL hike on blue foam 10x each side  DRY NEEDLING: Dry needling consent given? yes Educational handouts provided? Previously  Muscles treated: bil lumbar multifidi, gluteals Response from dry needling: improved soft tissue mobility and pain  E-stim with DN: 2.0 ma 10 min   07/30/2021: Nustep Level 5 x6 min with PT present to discuss status 5 times sit to stand: 21.5 sec with UE on thighs Seated pelvic tilt on dynadisc:  4 way, clockwise and counterclockwise x10 each Seated piriformis stretch x20 sec bilat Sit to/from stand sitting on 2 blue foam mats holding 5# kettlebell:  x10 with chest press, x10 overhead press Prone:  HS curl x10 bilat, hip extension x10 bilat Seated 3 way green pball rollout 5x10 sec each Wall push ups 2x10 with cuing for core stability  07/27/21: Nustep Level 5 x11 min with PT present to discuss status Standing with foot on step:  Hamstring and hip flexion stretch 2x20 sec bilat  Lat Pull 45# 2x10 reps Seated pelvic tilt on dynadisc:  4 way, clockwise and counterclockwise x10 each QL hike on blue foam 10x each side Moist heat 5 min DRY NEEDLING: Dry needling consent given? yes Educational handouts provided? Previously  Muscles treated: bil lumbar multifidi, gluteals Response from dry needling: improved soft tissue mobility and pain  E-stim with DN: 2.0 ma 15 min   07/20/2021: Nustep Level 5 x11 min with PT present to discuss status Neuroscience of pain education Seated pelvic tilt on dynadisc:  4 way, clockwise and counterclockwise x10 each Moist heat 5 min DRY NEEDLING: Dry needling consent given? yes Educational handouts provided? Previously  Muscles treated: bil lumbar multifidi, gluteals Response from dry needling: improved soft tissue mobility and pain  E-stim with DN: 2.0 ma 15 min    PATIENT EDUCATION:   Education details: Reviewed HEP and importance of meditation Person educated: Patient Education method: Explanation Education comprehension: verbalized understanding   HOME EXERCISE PROGRAM: Access Code: [email or web address] URL: [email or web address] Date: 07/02/2021 Prepared by: Juel Burrow  Exercises Seated Pelvic Floor Contraction - 1-2 x daily - 7 x weekly - 1 sets - 10 reps Supine Transversus Abdominis Bracing - Hands on Stomach - 1 x daily - 7 x weekly - 2-3 sets - 10 reps Supine March with Posterior Pelvic Tilt - 1-2 x daily - 7 x weekly - 1 sets - 10 reps Hooklying Sequential Leg March and Lower - 1 x daily - 7 x weekly - 2-3 sets - 10 reps Hooklying Hamstring Stretch with Strap - 2 x daily - 7 x weekly - 1 sets - 3 reps - 30 sec hold Supine Piriformis Stretch with Foot on Ground - 1-2 x daily - 7 x weekly - 1 sets - 2 reps - 20 sec hold Supine Bridge - 1-2 x daily - 7 x weekly - 1 sets - 10 reps Standing Quad Stretch with Table and Chair Support - 2 x daily - 7 x weekly - 1 sets - 3 reps - 30 sec    ASSESSMENT:  CLINICAL IMPRESSION:  The patient had an exacerbation of pain this week and therefore patient called to have an appointment sooner than planned.  His movement is quite antalgic with gait and  unable to stand fully erect.  Treatment focus on pain management to help him with his functional mobility during his daughter's wedding this weekend.  Following treatment improved fascial mobility and pain intensity but still guarded movement.     OBJECTIVE IMPAIRMENTS Abnormal gait, decreased activity tolerance, decreased mobility, decreased ROM, decreased strength, hypomobility, increased muscle spasms, impaired flexibility, improper body mechanics, postural dysfunction, and pain.   ACTIVITY LIMITATIONS cleaning and occupation.   PERSONAL FACTORS Age, Time since onset of injury/illness/exacerbation, and 1 comorbidity: surgery 10/19/2020  are also affecting patient's  functional outcome.    REHAB POTENTIAL: Good  CLINICAL DECISION MAKING: Evolving/moderate complexity  EVALUATION COMPLEXITY: Moderate   GOALS: Goals reviewed with patient? Yes  SHORT TERM GOALS: Target date: 02/19/2022  Pt will be independent with his initial HEP to improve flexibility and understanding of proper body mechanics. Baseline:  Goal status: MET   LONG TERM GOALS: Target date: 11/02/2021  Pt will have atleast 4/5 MMT of BLE which will improve his efficiency with walking and other ADLs. Baseline:  Goal status: IN PROGRESS  2.  Pt will report atleast 80% improvement in his back pain and function from the start of PT which will improve his ability to participate in work related tasks. Baseline:  Goal status: IN PROGRESS  3.  Pt will be able to complete 5x sit to stand with min to mod UE assist in less than 20 sec to reflect improvements in his trunk/LE strength. Baseline:  Goal status: MET  4.  Pt will be able to complete bed mobility such as rolling and supine to/from sit without the need for UE support or increase in low back pain. Baseline:  Goal status: MET  5.  The patient will have LE strength needed to rise 1x from a standard height chair with min UE assist. Baseline:  Goal status: MET  6.  Pt to decrease Modified Oswestry Index to 20% or less to demonstrate decreased functional back pain. Baseline:  Goal status: IN PROGRESS 7.  Able to lift 25# object with good form with min pain. Baseline: Goal status:  IN PROGRESS    PLAN: PT FREQUENCY: 1x/week  PT DURATION: 8 weeks  PLANNED INTERVENTIONS: Therapeutic exercises, Therapeutic activity, Neuromuscular re-education, Balance training, Gait training, Patient/Family education, Joint mobilization, Dry Needling, Cryotherapy, Moist heat, Taping, and Manual therapy.  PLAN FOR NEXT SESSION:  decrease frequency to 1x/week or every other week;  strengthening, core stability, modalities and manual therapy,  DN/ES as needed   Ruben Im, PT 09/07/21 11:51 AM Phone: [phone] Fax: [place] [address], [address] [place], [place] [address] Phone # [phone] Fax [phone]

## 2021-09-14 ENCOUNTER — Ambulatory Visit: Payer: BC Managed Care – PPO | Attending: Student | Admitting: Physical Therapy

## 2021-09-14 DIAGNOSIS — M62830 Muscle spasm of back: Secondary | ICD-10-CM | POA: Diagnosis present

## 2021-09-14 DIAGNOSIS — M545 Low back pain, unspecified: Secondary | ICD-10-CM

## 2021-09-14 DIAGNOSIS — M6281 Muscle weakness (generalized): Secondary | ICD-10-CM
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
## 2021-09-17 ENCOUNTER — Encounter: Payer: BC Managed Care – PPO | Admitting: Rehabilitative and Restorative Service Providers"

## 2021-09-21 ENCOUNTER — Ambulatory Visit: Payer: BC Managed Care – PPO | Admitting: Physical Therapy

## 2021-09-21 ENCOUNTER — Encounter: Payer: BC Managed Care – PPO | Admitting: Physical Therapy

## 2021-09-21 DIAGNOSIS — M545 Low back pain, unspecified: Secondary | ICD-10-CM

## 2021-09-21 DIAGNOSIS — M6281 Muscle weakness (generalized): Secondary | ICD-10-CM

## 2021-09-21 DIAGNOSIS — M62830 Muscle spasm of back: Secondary | ICD-10-CM

## 2021-09-21 NOTE — Therapy (Signed)
OUTPATIENT PHYSICAL THERAPY THORACOLUMBAR TREATMENT   Patient Name: Jerry Keller MRN: [id number] DOB:18-Dec-1960, 61 y.o., male Today's Date: 09/21/2021   PT End of Session - 08/10/21 1006     Visit Number 40   Date for PT Re-Evaluation 10/19/21   Authorization Type BCBS    Authorization Time Period 04/15/2021 - 04/14/2022    PT Start Time 1100   PT Stop Time 1145   PT Time Calculation (min) 45 min DN    Activity Tolerance Patient tolerated treatment well             Past Medical History:  Diagnosis Date   Anxiety    GERD (gastroesophageal reflux disease)    Headache, cluster    Hyperlipidemia    a. Mild - statin started 02/2014.   Hypothyroidism    Non-obstructive CAD    a. 02/2014 Cath: LM nl, LAD 80md, LCX nl, RCA 20p/m, EF 55-60%-->Med Rx.   Palpitations    none recently   Sleep apnea    uses cpap   Past Surgical History:  Procedure Laterality Date   COLONOSCOPY N/A 07/30/2013   Procedure: COLONOSCOPY;  Surgeon: PBeryle Beams MD;  Location: [facility];  Service: Endoscopy;  Laterality: N/A;   LEFT HEART CATH AND CORONARY ANGIOGRAPHY N/A 06/23/2019   Procedure: LEFT HEART CATH AND CORONARY ANGIOGRAPHY;  Surgeon: CSherren Mocha MD;  [facility];  Service: Cardiovascular;  Laterality: N/A;   LEFT HEART CATHETERIZATION WITH CORONARY ANGIOGRAM N/A 03/08/2014   Procedure: LEFT HEART CATHETERIZATION WITH CORONARY ANGIOGRAM;  Surgeon: CBurnell Blanks MD;  [facility];  Service: Cardiovascular;  Laterality: N/A;   TONSILLECTOMY AND ADENOIDECTOMY     as a child   Patient Active Problem List   Diagnosis Date Noted   Spondylolisthesis of lumbosacral region 10/19/2020   Chronic tension-type headache, not intractable 06/23/2020   Hyperlipidemia    GERD (gastroesophageal reflux disease)    Excessive daytime sleepiness 01/12/2018   Seasonal and perennial allergic rhinitis 01/12/2018   CAD (coronary artery disease) 03/25/2014   Pain  of left calf 03/25/2014   Morbid obesity ([place] 03/09/2014   Midsternal chest pain 03/09/2014   Unstable angina ([place] 03/08/2014   Hypothyroidism 03/08/2014   Chest pain    OSA (obstructive sleep apnea) 07/27/2012    PCP: WChristain Sacramento MD  REFERRING PROVIDER: BPatricia Nettle NP  REFERRING DIAG: Z98.1 (ICD-10-CM) - Arthrodesis status  THERAPY DIAG:  Low back pain, unspecified back pain laterality, unspecified chronicity, unspecified whether sciatica present  Muscle spasm of back  Muscle weakness (generalized)  ONSET DATE: 10/19/2020  SUBJECTIVE:  SUBJECTIVE STATEMENT:   Thinking about downsizing home.   I do good until about Wednesday.  The bathroom issues continue to be a problem.  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  Not a constant pain.  It's very sudden.     PERTINENT HISTORY:  Back surgery on 10/19/2020, anxiety, GERD, L heart cath;  PAIN:     Are you having pain? Yes:  5/10  low back   PATIENT GOALS:  improve sleep, improve pain to allow him to return to work    OBJECTIVE:   PATIENT SURVEYS:   Modified Oswestry 22/50 =44% Disability on 06/22/2021  FOTO 47% on 07/02/2021 FOTO 60% on 5/19  Oswestry 5/12:  26%   SCREENING FOR RED FLAGS:  07/02/2021: Bowel or bladder incontinence: Yes: Pt reports has not had any since last week, had 3-4 boughts of incontinence last week.  5/12:  No recent incontinence in last 2 weeks COGNITION:  Overall cognitive status: Within functional limits for tasks assessed      LE MMT:  MMT Right 06/22/2021 5/12 Left 06/22/2021 5/12  Hip flexion 4- 4 4- 4  Hip extension      Hip abduction 4- 4 4- 4  Hip adduction      Hip internal rotation      Hip external rotation      Knee flexion       Knee extension      Lumbar Flexion 4- 4 4- 4  Lumbar Extension 4- 4 4- 4  Ankle inversion      Ankle eversion       (Blank rows = not tested)  FUNCTIONAL TESTS:  08/24/21:  5x sit to stand chair plus 1 cushion: >30 sec today min use on thighs with difficulty stabilizing initially 07/30/2021:  5 times sit to stand: 21.5 sec with UE on thighs  06/11/2021: 5 times sit to stand: 18.9 sec with UE on thighs   TODAY'S TREATMENT   6/9: Nustep Level 5 x6 min with PT present to discuss status Discussion on pelvic floor PT  Review of current HEP DRY NEEDLING: Dry needling consent given? yes Educational handouts provided? Previously  Muscles treated: bil lumbar multifidi, gluteals Response from dry needling: improved soft tissue mobility and pain  E-stim with DN: 3.0 ma 15 min frequency lowered to 50       6/2; Nustep Level 5 x6 min with PT present to discuss status Discussion of response to HEP this week DRY NEEDLING: Dry needling consent given? yes Educational handouts provided? Previously  Muscles treated: bil lumbar multifidi, gluteals Response from dry needling: improved soft tissue mobility and pain  E-stim with DN: 3.0 ma 15 min frequency lowered to 50   5/26: Nustep Level 5 x6 min with PT present to discuss status Seated pelvic tilt on dynadisc:  4 way, clockwise and counterclockwise x10 each Standing with foot on step:   hip flexion stretch 2x20 sec bilat    DRY NEEDLING: Dry needling consent given? yes Educational handouts provided? Previously  Muscles treated: bil lumbar multifidi, gluteals Response from dry needling: improved soft tissue mobility and pain  E-stim with DN: 3.0 ma 15 min frequency lowered to 60 from 80     PATIENT EDUCATION:  Education details: Reviewed HEP and importance of meditation Person educated: Patient Education method: Explanation Education comprehension: verbalized understanding   HOME EXERCISE PROGRAM: Access Code:  [email or web address] URL: [email or web address] Date: 07/02/2021 Prepared by: Juel Burrow  Exercises Seated Pelvic Floor Contraction - 1-2 x daily - 7 x weekly - 1 sets - 10 reps Supine Transversus Abdominis Bracing - Hands on Stomach - 1 x daily - 7 x weekly - 2-3 sets - 10 reps Supine March with Posterior Pelvic Tilt - 1-2 x daily - 7 x weekly - 1 sets - 10 reps Hooklying Sequential Leg March and Lower - 1 x daily - 7 x weekly - 2-3 sets - 10 reps Hooklying Hamstring Stretch with Strap - 2 x daily - 7 x weekly - 1 sets - 3 reps - 30 sec hold Supine Piriformis Stretch with Foot on Ground - 1-2 x daily - 7 x weekly - 1 sets - 2 reps - 20 sec hold Supine Bridge - 1-2 x daily - 7 x weekly - 1 sets - 10 reps Standing Quad Stretch with Table and Chair Support - 2 x daily - 7 x weekly - 1 sets - 3 reps - 30 sec    ASSESSMENT:  CLINICAL IMPRESSION:  Moving well but sudden intense pain starting mid-week.  Continued incontinence issues which have increased in the last few weeks.  Discussed pelvic floor PT after follow up with urologist.  He is highly compliant with his HEP so treatment focus on DN combined with ES which provides several days relief.  OBJECTIVE IMPAIRMENTS Abnormal gait, decreased activity tolerance, decreased mobility, decreased ROM, decreased strength, hypomobility, increased muscle spasms, impaired flexibility, improper body mechanics, postural dysfunction, and pain.   ACTIVITY LIMITATIONS cleaning and occupation.   PERSONAL FACTORS Age, Time since onset of injury/illness/exacerbation, and 1 comorbidity: surgery 10/19/2020  are also affecting patient's functional outcome.    REHAB POTENTIAL: Good  CLINICAL DECISION MAKING: Evolving/moderate complexity  EVALUATION COMPLEXITY: Moderate   GOALS: Goals reviewed with patient? Yes  SHORT TERM GOALS: Target date: 02/19/2022  Pt will be independent with his initial HEP to improve flexibility and understanding of proper body  mechanics. Baseline:  Goal status: MET   LONG TERM GOALS: Target date: 11/16/2021  Pt will have atleast 4/5 MMT of BLE which will improve his efficiency with walking and other ADLs. Baseline:  Goal status: IN PROGRESS  2.  Pt will report atleast 80% improvement in his back pain and function from the start of PT which will improve his ability to participate in work related tasks. Baseline:  Goal status: IN PROGRESS  3.  Pt will be able to complete 5x sit to stand with min to mod UE assist in less than 20 sec to reflect improvements in his trunk/LE strength. Baseline:  Goal status: MET  4.  Pt will be able to complete bed mobility such as rolling and supine to/from sit without the need for UE support or increase in low back pain. Baseline:  Goal status: MET  5.  The patient will have LE strength needed to rise 1x from a standard height chair with min UE assist. Baseline:  Goal status: MET  6.  Pt to decrease Modified Oswestry Index to 20% or less to demonstrate decreased functional back pain. Baseline:  Goal status: IN PROGRESS 7.  Able to lift 25# object with good form with min pain. Baseline: Goal status:  IN PROGRESS    PLAN: PT FREQUENCY: 1x/week  PT DURATION: 8 weeks  PLANNED INTERVENTIONS: Therapeutic exercises, Therapeutic activity, Neuromuscular re-education, Balance training, Gait training, Patient/Family education, Joint mobilization, Dry Needling, Cryotherapy, Moist heat, Taping, and Manual therapy.  PLAN FOR NEXT SESSION:  strengthening, core stability, modalities  and manual therapy, DN/ES as needed; possible referral to pelvic floor PT   Ruben Im, PT 09/21/21 11:44 AM Phone: [phone] Fax: [facility] [address], [address] [place], [place] [address] Phone # [phone] Fax [phone]

## 2021-09-28 ENCOUNTER — Ambulatory Visit: Payer: BC Managed Care – PPO | Admitting: Physical Therapy

## 2021-09-28 ENCOUNTER — Encounter: Payer: Self-pay | Admitting: Physical Therapy

## 2021-09-28 DIAGNOSIS — M545 Low back pain, unspecified: Secondary | ICD-10-CM

## 2021-09-28 DIAGNOSIS — M6281 Muscle weakness (generalized): Secondary | ICD-10-CM

## 2021-09-28 DIAGNOSIS — M62830 Muscle spasm of back: Secondary | ICD-10-CM

## 2021-09-28 NOTE — Therapy (Signed)
OUTPATIENT PHYSICAL THERAPY THORACOLUMBAR TREATMENT   Patient Name: Jerry Keller MRN: [id number] DOB:19-Jul-1960, 61 y.o., male Today's Date: 09/28/2021   PT End of Session - 08/10/21 1006     Visit Number 40   Date for PT Re-Evaluation 10/19/21   Authorization Type [employer]    Authorization Time Period 04/15/2021 - 04/14/2022    PT Start Time 1100   PT Stop Time 1145   PT Time Calculation (min) 45 min DN    Activity Tolerance Patient tolerated treatment well             Past Medical History:  Diagnosis Date   Anxiety    GERD (gastroesophageal reflux disease)    Headache, cluster    Hyperlipidemia    a. Mild - statin started 02/2014.   Hypothyroidism    Non-obstructive CAD    a. 02/2014 Cath: LM nl, LAD 40md, LCX nl, RCA 20p/m, EF 55-60%-->Med Rx.   Palpitations    none recently   Sleep apnea    uses cpap   Past Surgical History:  Procedure Laterality Date   COLONOSCOPY N/A 07/30/2013   Procedure: COLONOSCOPY;  Surgeon: PBeryle Beams MD;  [facility];  Service: Endoscopy;  Laterality: N/A;   LEFT HEART CATH AND CORONARY ANGIOGRAPHY N/A 06/23/2019   Procedure: LEFT HEART CATH AND CORONARY ANGIOGRAPHY;  Surgeon: CSherren Mocha MD;  [facility];  Service: Cardiovascular;  Laterality: N/A;   LEFT HEART CATHETERIZATION WITH CORONARY ANGIOGRAM N/A 03/08/2014   Procedure: LEFT HEART CATHETERIZATION WITH CORONARY ANGIOGRAM;  Surgeon: CBurnell Blanks MD;  [facility];  Service: Cardiovascular;  Laterality: N/A;   TONSILLECTOMY AND ADENOIDECTOMY     as a child   Patient Active Problem List   Diagnosis Date Noted   Spondylolisthesis of lumbosacral region 10/19/2020   Chronic tension-type headache, not intractable 06/23/2020   Hyperlipidemia    GERD (gastroesophageal reflux disease)    Excessive daytime sleepiness 01/12/2018   Seasonal and perennial allergic rhinitis 01/12/2018   CAD (coronary artery disease) 03/25/2014    Pain of left calf 03/25/2014   Morbid obesity ([place] 03/09/2014   Midsternal chest pain 03/09/2014   Unstable angina ([place] 03/08/2014   Hypothyroidism 03/08/2014   Chest pain    OSA (obstructive sleep apnea) 07/27/2012    PCP: WChristain Sacramento MD  REFERRING PROVIDER: BPatricia Nettle NP  REFERRING DIAG: Z98.1 (ICD-10-CM) - Arthrodesis status  THERAPY DIAG:  Low back pain, unspecified back pain laterality, unspecified chronicity, unspecified whether sciatica present  Muscle spasm of back  Muscle weakness (generalized)  ONSET DATE: 10/19/2020  SUBJECTIVE:  SUBJECTIVE STATEMENT:  Not a great day. I am having a lot of LE symptoms. Calves were bad last night and some burning on the bottom of the feet.      PERTINENT HISTORY:  Back surgery on 10/19/2020, anxiety, GERD, L heart cath;  PAIN:     Are you having pain? Yes:  5/10  low back and down into the calves.    PATIENT GOALS:  improve sleep, improve pain to allow him to return to work    OBJECTIVE:   PATIENT SURVEYS:   Modified Oswestry 22/50 =44% Disability on 06/22/2021  FOTO 47% on 07/02/2021 FOTO 60% on 5/19  Oswestry 5/12:  26%   SCREENING FOR RED FLAGS:  07/02/2021: Bowel or bladder incontinence: Yes: Pt reports has not had any since last week, had 3-4 boughts of incontinence last week.  5/12:  No recent incontinence in last 2 weeks COGNITION:  Overall cognitive status: Within functional limits for tasks assessed      LE MMT:  MMT Right 06/22/2021 5/12 Left 06/22/2021 5/12  Hip flexion 4- 4 4- 4  Hip extension      Hip abduction 4- 4 4- 4  Hip adduction      Hip internal rotation      Hip external rotation      Knee flexion      Knee extension      Lumbar Flexion 4- 4 4- 4  Lumbar Extension 4- 4 4- 4  Ankle inversion       Ankle eversion       (Blank rows = not tested)  FUNCTIONAL TESTS:  08/24/21:  5x sit to stand chair plus 1 cushion: >30 sec today min use on thighs with difficulty stabilizing initially 07/30/2021:  5 times sit to stand: 21.5 sec with UE on thighs  06/11/2021: 5 times sit to stand: 18.9 sec with UE on thighs   TODAY'S TREATMENT   09/28/21:Nustep Level 5 x6 min with PT present to discuss status Manual: Soft tissue work, MFR to Bil lumbar and gluteals, Addaday assist also to glutes and hamstrings. F/B IFC with moist heat to lumbar Bil to mid glute. 15 min in LT sidelying with RTLE supported on foam roll.    6/9: Nustep Level 5 x6 min with PTA present to discuss status  DRY NEEDLING: Dry needling consent given? yes Educational handouts provided? Previously  Muscles treated: bil lumbar multifidi, gluteals Response from dry needling: improved soft tissue mobility and pain  E-stim with DN: 3.0 ma 15 min frequency lowered to 50       6/2; Nustep Level 5 x6 min with PT present to discuss status Discussion of response to HEP this week DRY NEEDLING: Dry needling consent given? yes Educational handouts provided? Previously  Muscles treated: bil lumbar multifidi, gluteals Response from dry needling: improved soft tissue mobility and pain  E-stim with DN: 3.0 ma 15 min frequency lowered to 50   5/26: Nustep Level 5 x6 min with PT present to discuss status Seated pelvic tilt on dynadisc:  4 way, clockwise and counterclockwise x10 each Standing with foot on step:   hip flexion stretch 2x20 sec bilat    DRY NEEDLING: Dry needling consent given? yes Educational handouts provided? Previously  Muscles treated: bil lumbar multifidi, gluteals Response from dry needling: improved soft tissue mobility and pain  E-stim with DN: 3.0 ma 15 min frequency lowered to 60 from 80     PATIENT EDUCATION:  Education details: Reviewed HEP and importance  of meditation Person educated:  Patient Education method: Explanation Education comprehension: verbalized understanding   HOME EXERCISE PROGRAM: Access Code: [email or web address] URL: [email or web address] Date: 07/02/2021 Prepared by: Juel Burrow  Exercises Seated Pelvic Floor Contraction - 1-2 x daily - 7 x weekly - 1 sets - 10 reps Supine Transversus Abdominis Bracing - Hands on Stomach - 1 x daily - 7 x weekly - 2-3 sets - 10 reps Supine March with Posterior Pelvic Tilt - 1-2 x daily - 7 x weekly - 1 sets - 10 reps Hooklying Sequential Leg March and Lower - 1 x daily - 7 x weekly - 2-3 sets - 10 reps Hooklying Hamstring Stretch with Strap - 2 x daily - 7 x weekly - 1 sets - 3 reps - 30 sec hold Supine Piriformis Stretch with Foot on Ground - 1-2 x daily - 7 x weekly - 1 sets - 2 reps - 20 sec hold Supine Bridge - 1-2 x daily - 7 x weekly - 1 sets - 10 reps Standing Quad Stretch with Table and Chair Support - 2 x daily - 7 x weekly - 1 sets - 3 reps - 30 sec    ASSESSMENT:  CLINICAL IMPRESSION:  Pt arrives to PT with a lot of pain including radiating pain into BilLE and feet. Pt already did his stretching and some exercises before his appt today so treatment focused on manual techniques to reduced spasm in lumbar paraspinals, QL and gluteal tension.   OBJECTIVE IMPAIRMENTS Abnormal gait, decreased activity tolerance, decreased mobility, decreased ROM, decreased strength, hypomobility, increased muscle spasms, impaired flexibility, improper body mechanics, postural dysfunction, and pain.   ACTIVITY LIMITATIONS cleaning and occupation.   PERSONAL FACTORS Age, Time since onset of injury/illness/exacerbation, and 1 comorbidity: surgery 10/19/2020  are also affecting patient's functional outcome.    REHAB POTENTIAL: Good  CLINICAL DECISION MAKING: Evolving/moderate complexity  EVALUATION COMPLEXITY: Moderate   GOALS: Goals reviewed with patient? Yes  SHORT TERM GOALS: Target date: 02/19/2022  Pt will be  independent with his initial HEP to improve flexibility and understanding of proper body mechanics. Baseline:  Goal status: MET   LONG TERM GOALS: Target date: 11/23/2021  Pt will have atleast 4/5 MMT of BLE which will improve his efficiency with walking and other ADLs. Baseline:  Goal status: IN PROGRESS  2.  Pt will report atleast 80% improvement in his back pain and function from the start of PT which will improve his ability to participate in work related tasks. Baseline:  Goal status: IN PROGRESS  3.  Pt will be able to complete 5x sit to stand with min to mod UE assist in less than 20 sec to reflect improvements in his trunk/LE strength. Baseline:  Goal status: MET  4.  Pt will be able to complete bed mobility such as rolling and supine to/from sit without the need for UE support or increase in low back pain. Baseline:  Goal status: MET  5.  The patient will have LE strength needed to rise 1x from a standard height chair with min UE assist. Baseline:  Goal status: MET  6.  Pt to decrease Modified Oswestry Index to 20% or less to demonstrate decreased functional back pain. Baseline:  Goal status: IN PROGRESS 7.  Able to lift 25# object with good form with min pain. Baseline: Goal status:  IN PROGRESS    PLAN: PT FREQUENCY: 1x/week  PT DURATION: 8 weeks  PLANNED INTERVENTIONS: Therapeutic exercises, Therapeutic activity, Neuromuscular re-education, Balance training, Gait  training, Patient/Family education, Joint mobilization, Dry Needling, Cryotherapy, Moist heat, Taping, and Manual therapy.  PLAN FOR NEXT SESSION:  strengthening, core stability, modalities and manual therapy, DN/ES as needed; possible referral to pelvic floor PT   Myrene Galas, PTA 09/28/21 10:55 AM     [facility] [address] [place], [place] [address] Phone # [phone] Fax [phone]

## 2021-10-05 ENCOUNTER — Encounter: Payer: BC Managed Care – PPO | Admitting: Physical Therapy

## 2021-10-08 ENCOUNTER — Encounter: Payer: BC Managed Care – PPO | Admitting: Rehabilitative and Restorative Service Providers"

## 2021-10-12 ENCOUNTER — Encounter: Payer: Self-pay | Admitting: Physical Therapy

## 2021-10-12 ENCOUNTER — Ambulatory Visit: Payer: BC Managed Care – PPO | Admitting: Physical Therapy

## 2021-10-12 DIAGNOSIS — M6281 Muscle weakness (generalized): Secondary | ICD-10-CM

## 2021-10-12 DIAGNOSIS — M62830 Muscle spasm of back: Secondary | ICD-10-CM

## 2021-10-12 DIAGNOSIS — M545 Low back pain, unspecified: Secondary | ICD-10-CM | POA: Diagnosis not present

## 2021-10-12 NOTE — Therapy (Signed)
OUTPATIENT PHYSICAL THERAPY THORACOLUMBAR TREATMENT   Patient Name: Jerry Keller MRN: [id number] DOB:1960/11/12, 61 y.o., male Today's Date: 10/12/2021   PT End of Session - 08/10/21 1006     Visit Number 40   Date for PT Re-Evaluation 10/19/21   Authorization Type [employer]    Authorization Time Period 04/15/2021 - 04/14/2022    PT Start Time 1100   PT Stop Time 1145   PT Time Calculation (min) 45 min DN    Activity Tolerance Patient tolerated treatment well             Past Medical History:  Diagnosis Date   Anxiety    GERD (gastroesophageal reflux disease)    Headache, cluster    Hyperlipidemia    a. Mild - statin started 02/2014.   Hypothyroidism    Non-obstructive CAD    a. 02/2014 Cath: LM nl, LAD 45md, LCX nl, RCA 20p/m, EF 55-60%-->Med Rx.   Palpitations    none recently   Sleep apnea    uses cpap   Past Surgical History:  Procedure Laterality Date   COLONOSCOPY N/A 07/30/2013   Procedure: COLONOSCOPY;  Surgeon: PBeryle Beams MD;  Location: [facility];  Service: Endoscopy;  Laterality: N/A;   LEFT HEART CATH AND CORONARY ANGIOGRAPHY N/A 06/23/2019   Procedure: LEFT HEART CATH AND CORONARY ANGIOGRAPHY;  Surgeon: CSherren Mocha MD;  [facility];  Service: Cardiovascular;  Laterality: N/A;   LEFT HEART CATHETERIZATION WITH CORONARY ANGIOGRAM N/A 03/08/2014   Procedure: LEFT HEART CATHETERIZATION WITH CORONARY ANGIOGRAM;  Surgeon: CBurnell Blanks MD;  [facility];  Service: Cardiovascular;  Laterality: N/A;   TONSILLECTOMY AND ADENOIDECTOMY     as a child   Patient Active Problem List   Diagnosis Date Noted   Spondylolisthesis of lumbosacral region 10/19/2020   Chronic tension-type headache, not intractable 06/23/2020   Hyperlipidemia    GERD (gastroesophageal reflux disease)    Excessive daytime sleepiness 01/12/2018   Seasonal and perennial allergic rhinitis 01/12/2018   CAD (coronary artery disease) 03/25/2014    Pain of left calf 03/25/2014   Morbid obesity ([facility] 03/09/2014   Midsternal chest pain 03/09/2014   Unstable angina ([facility] 03/08/2014   Hypothyroidism 03/08/2014   Chest pain    OSA (obstructive sleep apnea) 07/27/2012    PCP: WChristain Sacramento MD  REFERRING PROVIDER: BPatricia Nettle NP  REFERRING DIAG: Z98.1 (ICD-10-CM) - Arthrodesis status  THERAPY DIAG:  No diagnosis found.  ONSET DATE: 10/19/2020  SUBJECTIVE:  SUBJECTIVE STATEMENT:  What you did last time was wonderful, my right side has not hurt since. Today I would like to work on my left side.   PERTINENT HISTORY:  Back surgery on 10/19/2020, anxiety, GERD, L heart cath;  PAIN:     Are you having pain? Yes:  4/10  left low back.   PATIENT GOALS:  improve sleep, improve pain to allow him to return to work    OBJECTIVE:   PATIENT SURVEYS:   Modified Oswestry 22/50 =44% Disability on 06/22/2021  FOTO 47% on 07/02/2021 FOTO 60% on 5/19  Oswestry 5/12:  26%   SCREENING FOR RED FLAGS:  07/02/2021: Bowel or bladder incontinence: Yes: Pt reports has not had any since last week, had 3-4 boughts of incontinence last week. MANUAL: Soft tissue to LT lumbar and proximal glutes   In sidelying.   5/12:  No recent incontinence in last 2 weeks COGNITION:  Overall cognitive status: Within functional limits for tasks assessed      LE MMT:  MMT Right 06/22/2021 5/12 Left 06/22/2021 5/12  Hip flexion 4- 4 4- 4  Hip extension      Hip abduction 4- 4 4- 4  Hip adduction      Hip internal rotation      Hip external rotation      Knee flexion      Knee extension      Lumbar Flexion 4- 4 4- 4  Lumbar Extension 4- 4 4- 4  Ankle inversion      Ankle eversion       (Blank rows = not tested)  FUNCTIONAL TESTS:  08/24/21:  5x sit to stand  chair plus 1 cushion: >30 sec today min use on thighs with difficulty stabilizing initially 07/30/2021:  5 times sit to stand: 21.5 sec with UE on thighs  06/11/2021: 5 times sit to stand: 18.9 sec with UE on thighs   TODAY'S TREATMENT   10/12/21: Nustep Level 5 x15  min with PTA present to discuss status Manual: Soft tissue work, MFR to Bil lumbar and gluteals, Addaday assist also to glutes and hamstrings Verbal review and commitment to his walking, HEP and overall commitment to his health despite long work hours and the fatigue that comes with that.  09/28/21:Nustep Level 5 x6 min with PT present to discuss status Manual: Soft tissue work, MFR to Bil lumbar and gluteals, Addaday assist also to glutes and hamstrings. F/B IFC with moist heat to lumbar Bil to mid glute. 15 min in LT sidelying with RTLE supported on foam roll.    6/9: Nustep Level 5 x6 min with PTA present to discuss status  DRY NEEDLING: Dry needling consent given? yes Educational handouts provided? Previously  Muscles treated: bil lumbar multifidi, gluteals Response from dry needling: improved soft tissue mobility and pain  E-stim with DN: 3.0 ma 15 min frequency lowered to 50       6/2; Nustep Level 5 x6 min with PT present to discuss status Discussion of response to HEP this week DRY NEEDLING: Dry needling consent given? yes Educational handouts provided? Previously  Muscles treated: bil lumbar multifidi, gluteals Response from dry needling: improved soft tissue mobility and pain  E-stim with DN: 3.0 ma 15 min frequency lowered to 50   5/26: Nustep Level 5 x6 min with PT present to discuss status Seated pelvic tilt on dynadisc:  4 way, clockwise and counterclockwise x10 each Standing with foot on step:   hip flexion stretch 2x20  sec bilat    DRY NEEDLING: Dry needling consent given? yes Educational handouts provided? Previously  Muscles treated: bil lumbar multifidi, gluteals Response from dry  needling: improved soft tissue mobility and pain  E-stim with DN: 3.0 ma 15 min frequency lowered to 60 from 80     PATIENT EDUCATION:  Education details: Reviewed HEP and importance of meditation Person educated: Patient Education method: Explanation Education comprehension: verbalized understanding   HOME EXERCISE PROGRAM: Access Code: [email or web address] URL: [email or web address] Date: 07/02/2021 Prepared by: Juel Burrow  Exercises Seated Pelvic Floor Contraction - 1-2 x daily - 7 x weekly - 1 sets - 10 reps Supine Transversus Abdominis Bracing - Hands on Stomach - 1 x daily - 7 x weekly - 2-3 sets - 10 reps Supine March with Posterior Pelvic Tilt - 1-2 x daily - 7 x weekly - 1 sets - 10 reps Hooklying Sequential Leg March and Lower - 1 x daily - 7 x weekly - 2-3 sets - 10 reps Hooklying Hamstring Stretch with Strap - 2 x daily - 7 x weekly - 1 sets - 3 reps - 30 sec hold Supine Piriformis Stretch with Foot on Ground - 1-2 x daily - 7 x weekly - 1 sets - 2 reps - 20 sec hold Supine Bridge - 1-2 x daily - 7 x weekly - 1 sets - 10 reps Standing Quad Stretch with Table and Chair Support - 2 x daily - 7 x weekly - 1 sets - 3 reps - 30 sec    ASSESSMENT:  CLINICAL IMPRESSION:  Pt reports pain has been significantly improved since last session especially on the right side. Pt requested working on his left side today. Muscle tension and myofascial stickiness also much improved. We discussed strategies to adhere better to his HEP and walking when work hours get super long and he finds himself super tired. Pt verbally understood concepts we discussed.   OBJECTIVE IMPAIRMENTS Abnormal gait, decreased activity tolerance, decreased mobility, decreased ROM, decreased strength, hypomobility, increased muscle spasms, impaired flexibility, improper body mechanics, postural dysfunction, and pain.   ACTIVITY LIMITATIONS cleaning and occupation.   PERSONAL FACTORS Age, Time since onset of  injury/illness/exacerbation, and 1 comorbidity: surgery 10/19/2020  are also affecting patient's functional outcome.    REHAB POTENTIAL: Good  CLINICAL DECISION MAKING: Evolving/moderate complexity  EVALUATION COMPLEXITY: Moderate   GOALS: Goals reviewed with patient? Yes  SHORT TERM GOALS: Target date: 02/19/2022  Pt will be independent with his initial HEP to improve flexibility and understanding of proper body mechanics. Baseline:  Goal status: MET   LONG TERM GOALS: Target date: 12/07/2021  Pt will have atleast 4/5 MMT of BLE which will improve his efficiency with walking and other ADLs. Baseline:  Goal status: IN PROGRESS  2.  Pt will report atleast 80% improvement in his back pain and function from the start of PT which will improve his ability to participate in work related tasks. Baseline:  Goal status: IN PROGRESS  3.  Pt will be able to complete 5x sit to stand with min to mod UE assist in less than 20 sec to reflect improvements in his trunk/LE strength. Baseline:  Goal status: MET  4.  Pt will be able to complete bed mobility such as rolling and supine to/from sit without the need for UE support or increase in low back pain. Baseline:  Goal status: MET  5.  The patient will have LE strength needed to rise 1x from a standard height chair with  min UE assist. Baseline:  Goal status: MET  6.  Pt to decrease Modified Oswestry Index to 20% or less to demonstrate decreased functional back pain. Baseline:  Goal status: IN PROGRESS 7.  Able to lift 25# object with good form with min pain. Baseline: Goal status:  IN PROGRESS    PLAN: PT FREQUENCY: 1x/week  PT DURATION: 8 weeks  PLANNED INTERVENTIONS: Therapeutic exercises, Therapeutic activity, Neuromuscular re-education, Balance training, Gait training, Patient/Family education, Joint mobilization, Dry Needling, Cryotherapy, Moist heat, Taping, and Manual therapy.  PLAN FOR NEXT SESSION:  Review core  exercises, and prepare pt for [place], PTA 10/12/21 10:48 AM     [facility] [address], [place], [place] [address] Phone # [phone] Fax [phone]

## 2021-10-19 ENCOUNTER — Ambulatory Visit: Payer: BC Managed Care – PPO | Admitting: Physical Therapy

## 2021-10-21 ENCOUNTER — Other Ambulatory Visit: Payer: Self-pay

## 2021-10-21 ENCOUNTER — Emergency Department (HOSPITAL_BASED_OUTPATIENT_CLINIC_OR_DEPARTMENT_OTHER)
Admission: EM | Admit: 2021-10-21 | Discharge: 2021-10-21 | Disposition: A | Discharge status: Home / Self Care | Payer: BC Managed Care – PPO | Attending: Emergency Medicine | Admitting: Emergency Medicine

## 2021-10-21 ENCOUNTER — Emergency Department (HOSPITAL_BASED_OUTPATIENT_CLINIC_OR_DEPARTMENT_OTHER): Payer: BC Managed Care – PPO

## 2021-10-21 ENCOUNTER — Encounter (HOSPITAL_BASED_OUTPATIENT_CLINIC_OR_DEPARTMENT_OTHER): Payer: Self-pay

## 2021-10-21 DIAGNOSIS — Z7982 Long term (current) use of aspirin: Secondary | ICD-10-CM | POA: Insufficient documentation

## 2021-10-21 DIAGNOSIS — Z91040 Latex allergy status: Secondary | ICD-10-CM | POA: Insufficient documentation

## 2021-10-21 DIAGNOSIS — R110 Nausea: Secondary | ICD-10-CM | POA: Diagnosis not present

## 2021-10-21 DIAGNOSIS — R1031 Right lower quadrant pain: Secondary | ICD-10-CM | POA: Insufficient documentation

## 2021-10-21 LAB — COMPREHENSIVE METABOLIC PANEL
ALT: 41 U/L (ref 0–44)
AST: 29 U/L (ref 15–41)
Albumin: 4.8 g/dL (ref 3.5–5.0)
Alkaline Phosphatase: 66 U/L (ref 38–126)
Anion gap: 9 (ref 5–15)
BUN: 15 mg/dL (ref 6–20)
CO2: 27 mmol/L (ref 22–32)
Calcium: 10.5 mg/dL — ABNORMAL HIGH (ref 8.9–10.3)
Chloride: 102 mmol/L (ref 98–111)
Creatinine, Ser: 1.12 mg/dL (ref 0.61–1.24)
GFR, Estimated: >60 mL/min (ref >60)
Glucose, Bld: 92 mg/dL (ref 70–99)
Potassium: 4.3 mmol/L (ref 3.5–5.1)
Sodium: 138 mmol/L (ref 135–145)
Total Bilirubin: 0.7 mg/dL (ref 0.3–1.2)
Total Protein: 7.8 g/dL (ref 6.5–8.1)

## 2021-10-21 LAB — URINALYSIS, ROUTINE W REFLEX MICROSCOPIC
Bilirubin Urine: NEGATIVE
Glucose, UA: NEGATIVE mg/dL
Hgb urine dipstick: NEGATIVE
Ketones, ur: NEGATIVE mg/dL
Leukocytes,Ua: NEGATIVE
Nitrite: NEGATIVE
Protein, ur: NEGATIVE mg/dL
Specific Gravity, Urine: 1.011 (ref 1.005–1.030)
pH: 5.5 (ref 5.0–8.0)

## 2021-10-21 LAB — CBC
HCT: 48.5 % (ref 39.0–52.0)
Hemoglobin: 15.9 g/dL (ref 13.0–17.0)
MCH: 31.8 pg (ref 26.0–34.0)
MCHC: 32.8 g/dL (ref 30.0–36.0)
MCV: 97 fL (ref 80.0–100.0)
Platelets: 254 10*3/uL (ref 150–400)
RBC: 5 MIL/uL (ref 4.22–5.81)
RDW: 13 % (ref 11.5–15.5)
WBC: 6.3 10*3/uL (ref 4.0–10.5)
nRBC: 0 % (ref 0.0–0.2)

## 2021-10-21 LAB — LIPASE, BLOOD: Lipase: <10 U/L — ABNORMAL LOW (ref 11–51)

## 2021-10-21 MED ORDER — SODIUM CHLORIDE 0.9 % IV BOLUS
1000.0000 mL | Freq: Once | INTRAVENOUS | Status: AC
  Administered 2021-10-21: 1000 mL via INTRAVENOUS

## 2021-10-21 MED ORDER — IOHEXOL 300 MG/ML  SOLN
100.0000 mL | Freq: Once | INTRAMUSCULAR | Status: AC | PRN
  Administered 2021-10-21: 100 mL via INTRAVENOUS

## 2021-10-21 MED ORDER — ONDANSETRON HCL 4 MG/2ML IJ SOLN
4.0000 mg | Freq: Once | INTRAMUSCULAR | Status: AC
  Administered 2021-10-21: 4 mg via INTRAVENOUS
  Filled 2021-10-21: qty 2

## 2021-10-21 MED ORDER — METHOCARBAMOL 500 MG PO TABS: 500.0000 mg | ORAL_TABLET | Freq: Two times a day (BID) | ORAL | 0 refills | Status: DC

## 2021-10-21 MED ORDER — MORPHINE SULFATE (PF) 4 MG/ML IV SOLN
4.0000 mg | Freq: Once | INTRAVENOUS | Status: AC
  Administered 2021-10-21: 4 mg via INTRAVENOUS
  Filled 2021-10-21: qty 1

## 2021-10-21 MED ORDER — ONDANSETRON 4 MG PO TBDP: 4.0000 mg | ORAL_TABLET | Freq: Three times a day (TID) | ORAL | 0 refills | Status: DC | PRN

## 2021-10-21 NOTE — Discharge Instructions (Signed)
Your CT scan was without any abnormal findings.  Your appendix is normal.  Please use Zofran as needed for nausea.  Take Tylenol and ibuprofen as discussed below.  I have also written a prescription for a muscle relaxer which you may trial out to see if it helps with your pain.  Keep an eye on your symptoms return to the emergency room for any new or concerning symptoms otherwise please follow-up with your primary care provider.

## 2021-10-21 NOTE — ED Provider Notes (Signed)
[facility] EMERGENCY DEPT Provider Note   CSN: [id number] Arrival date & time: 10/21/21  1248     History  Chief Complaint  Patient presents with   Flank Pain    Zyron Deeley is a 61 y.o. male.   Flank Pain  Patient is a 61 year old male presented emergency room today with complaints of right lower quadrant abdominal pain seems that it has been done since yesterday evening around 10 PM.  He states it is gotten progressively worse endorses some nausea but no vomiting.  He denies any fevers chills cough congestion lightheadedness or dizziness.  He denies any history of abdominal surgeries.  Denies any changes to his bowel movements no blood in his bowel movements no diarrhea or constipation.  Denies any urinary frequency urgency dysuria or hematuria.     Home Medications Prior to Admission medications   Medication Sig Start Date End Date Taking? Authorizing Provider  aspirin EC 81 MG tablet Take 81 mg by mouth daily.    [provider]  Cholecalciferol (VITAMIN D3) 50 MCG (2000 UT) TABS Take 2,000 Units by mouth daily after breakfast.    [provider]  citalopram (CELEXA) 20 MG tablet Take 20 mg by mouth daily.    [provider]  clindamycin (CLEOCIN) 150 MG capsule Take 150 mg by mouth 3 (three) times daily. 10/18/20   [provider]  cyclobenzaprine (FLEXERIL) 10 MG tablet Take 1 tablet (10 mg total) by mouth 3 (three) times daily as needed for muscle spasms. 10/20/20   Newman Pies, MD  docusate sodium (COLACE) 100 MG capsule Take 1 capsule (100 mg total) by mouth 2 (two) times daily. 10/20/20   Newman Pies, MD  esomeprazole (NEXIUM) 20 MG capsule Take 20 mg by mouth daily as needed (for reflux).    [provider]  fluticasone (FLONASE) 50 MCG/ACT nasal spray Place 1-2 sprays into both nostrils at bedtime as needed (congestion).  06/14/16   [provider]  folic acid (FOLVITE) 883 MCG tablet Take 400  mcg by mouth daily.    [provider]  gabapentin (NEURONTIN) 300 MG capsule Take 300 mg by mouth 3 (three) times daily as needed (pain). 09/26/20   [provider]  levothyroxine (SYNTHROID) 112 MCG tablet Take 112 mcg by mouth daily before breakfast.    [provider]  Magnesium 250 MG TABS Take 250 mg by mouth daily.     [provider]  metoprolol tartrate (LOPRESSOR) 50 MG tablet Take 50 mg by mouth in the morning.    [provider]  oxyCODONE-acetaminophen (PERCOCET/ROXICET) 5-325 MG tablet Take 1-2 tablets by mouth every 4 (four) hours as needed for moderate pain. 10/20/20   Newman Pies, MD  PRESCRIPTION MEDICATION See admin instructions. CPAP- At bedtime and during naps    [provider]  tamsulosin (FLOMAX) 0.4 MG CAPS capsule Take 0.4 mg by mouth daily after breakfast.    [provider]  vitamin B-12 (CYANOCOBALAMIN) 1000 MCG tablet Take 1,000 mcg by mouth daily.    [provider]      Allergies    Latex    Review of Systems   Review of Systems  Genitourinary:  Positive for flank pain.    Physical Exam Updated Vital Signs BP (!) 136/91   Pulse 72   Temp 97.6 F (36.4 C)   Resp 18   Ht 6' (1.829 m)   Wt 122.5 kg   SpO2 99%   BMI 36.63 kg/m  Physical Exam Vitals and nursing note reviewed.  Constitutional:      General: He is not in acute distress. HENT:     Head: Normocephalic and atraumatic.     Nose: Nose normal.  Eyes:     General: No scleral icterus. Cardiovascular:     Rate and Rhythm: Normal rate and regular rhythm.     Pulses: Normal pulses.     Heart sounds: Normal heart sounds.  Pulmonary:     Effort: Pulmonary effort is normal. No respiratory distress.     Breath sounds: No wheezing.  Abdominal:     Palpations: Abdomen is soft.     Tenderness: There is abdominal tenderness.     Comments: Right lower quadrant with tenderness to palpation.  No guarding or rebound.   Negative Rovsing  Musculoskeletal:     Cervical back: Normal range of motion.     Right lower leg: No edema.     Left lower leg: No edema.  Skin:    General: Skin is warm and dry.     Capillary Refill: Capillary refill takes less than 2 seconds.  Neurological:     Mental Status: He is alert. Mental status is at baseline.  Psychiatric:        Mood and Affect: Mood normal.        Behavior: Behavior normal.     ED Results / Procedures / Treatments   Labs (all labs ordered are listed, but only abnormal results are displayed) Labs Reviewed  LIPASE, BLOOD - Abnormal; Notable for the following components:      Result Value   Lipase <10 (*)    All other components within normal limits  COMPREHENSIVE METABOLIC PANEL - Abnormal; Notable for the following components:   Calcium 10.5 (*)    All other components within normal limits  CBC  URINALYSIS, ROUTINE W REFLEX MICROSCOPIC    EKG None  Radiology No results found.  Procedures Procedures    Medications Ordered in ED Medications  sodium chloride 0.9 % bolus 1,000 mL (has no administration in time range)  morphine (PF) 4 MG/ML injection 4 mg (4 mg Intravenous Given 10/21/21 1601)  ondansetron (ZOFRAN) injection 4 mg (4 mg Intravenous Given 10/21/21 1558)    ED Course/ Medical Decision Making/ A&P Clinical Course as of 10/21/21 1703  Sun Oct 21, 2021  1530 Constant RLQ abd pain since last PM around ~10pm.  Nausea but no vomiting.  [WF]  1702 IMPRESSION: 1. No acute findings within the abdomen or pelvis.  Normal appendix. 2. Hepatic steatosis.   Electronically Signed   By: Lajean Manes M.D.   On: 10/21/2021 16:40   [WF]    Clinical Course User Index [WF] Tedd Sias, PA                           Medical Decision Making Amount and/or Complexity of Data Reviewed Labs: ordered. Radiology: ordered.  Risk Prescription drug management.   This patient presents to the ED for concern of abdominal pain and  nausea, this involves a number of treatment options, and is a complaint that carries with it a moderate to high risk of complications and morbidity.  The differential diagnosis includes appendicitis.   The causes of generalized abdominal pain include but are not limited to AAA, mesenteric ischemia, appendicitis, diverticulitis, DKA, gastritis, gastroenteritis, AMI, nephrolithiasis, pancreatitis, peritonitis, adrenal insufficiency,lead poisoning, iron toxicity, intestinal ischemia, constipation, UTI,SBO/LBO, splenic rupture, biliary disease, IBD,  IBS, PUD, or hepatitis. Ectopic pregnancy, ovarian torsion, PID.    Co morbidities: Discussed in HPI   Brief History:  Patient is a 61 year old male presented emergency room today with complaints of right lower quadrant abdominal pain seems that it has been done since yesterday evening around 10 PM.  He states it is gotten progressively worse endorses some nausea but no vomiting.  He denies any fevers chills cough congestion lightheadedness or dizziness.  He denies any history of abdominal surgeries.  Denies any changes to his bowel movements no blood in his bowel movements no diarrhea or constipation.  Denies any urinary frequency urgency dysuria or hematuria.    EMR reviewed including pt PMHx, past surgical history and past visits to ER.   See HPI for more details   Lab Tests:   I personally reviewed all laboratory work and imaging. Metabolic panel without any acute abnormality specifically kidney function within normal limits and no significant electrolyte abnormalities. CBC without leukocytosis or significant anemia.   Imaging Studies:  NAD. I personally reviewed all imaging studies and no acute abnormality found. I agree with radiology interpretation.    Cardiac Monitoring:  The patient was maintained on a cardiac monitor.  I personally viewed and interpreted the cardiac monitored which showed an underlying rhythm of: NSR EKG  non-ischemic   Medicines ordered:  I ordered medication including morphine, Zofran, 1 L normal saline for pain which is now improved Reevaluation of the patient after these medicines showed that the patient improved I have reviewed the patients home medicines and have made adjustments as needed   Critical Interventions:     Consults/Attending Physician      Reevaluation:  After the interventions noted above I re-evaluated patient and found that they have :resolved   Social Determinants of Health:      Problem List / ED Course:  Abdominal pain.  Normal CT and reassuring work-up.  Will discharge at this time with return precautions and follow-up instructions to see PCP   Dispostion:  After consideration of the diagnostic results and the patients response to treatment, I feel that the patent would benefit from close outpatient follow-up.    Final Clinical Impression(s) / ED Diagnoses Final diagnoses:  RLQ abdominal pain  Nausea    Rx / DC Orders ED Discharge Orders     None         Tedd Sias, [place] 10/21/21 1704    Davonna Belling, MD 10/24/21 1422

## 2021-10-21 NOTE — ED Triage Notes (Signed)
Patient here POV from Home.  Endorses RLQ/Flank Pain that began Last PM. Constant since it Began. [facility] [place].  Mild Nausea. No Emesis. No Constipation or Diarrhea. No Urinary Symptoms. No Known Fevers.   NAD Noted during Triage. A&Ox4. GCS 15. Ambulatory.

## 2021-10-26 ENCOUNTER — Ambulatory Visit: Payer: BC Managed Care – PPO | Attending: Student | Admitting: Physical Therapy

## 2021-10-26 DIAGNOSIS — M62830 Muscle spasm of back: Secondary | ICD-10-CM | POA: Insufficient documentation

## 2021-10-26 DIAGNOSIS — M545 Low back pain, unspecified: Secondary | ICD-10-CM | POA: Diagnosis not present

## 2021-10-26 DIAGNOSIS — M6281 Muscle weakness (generalized): Secondary | ICD-10-CM | POA: Insufficient documentation

## 2021-10-26 NOTE — Therapy (Addendum)
OUTPATIENT PHYSICAL THERAPY THORACOLUMBAR TREATMENT/RECERTIFICATION/DISCHARGE SUMMARY   Patient Name: Jerry Keller MRN: [id number] DOB:1961/02/09, 61 y.o., male Today's Date: 10/26/2021  PT End of Session - 10/26/21 1104     Visit Number 43    Date for PT Re-Evaluation 12/21/21    Authorization Type [employer]    Authorization Time Period 04/15/2021 - 04/14/2022    PT Start Time 1103    PT Stop Time 1145    PT Time Calculation (min) 42 min    Activity Tolerance Patient tolerated treatment well                  Past Medical History:  Diagnosis Date   Anxiety    GERD (gastroesophageal reflux disease)    Headache, cluster    Hyperlipidemia    a. Mild - statin started 02/2014.   Hypothyroidism    Non-obstructive CAD    a. 02/2014 Cath: LM nl, LAD 69md, LCX nl, RCA 20p/m, EF 55-60%-->Med Rx.   Palpitations    none recently   Sleep apnea    uses cpap   Past Surgical History:  Procedure Laterality Date   COLONOSCOPY N/A 07/30/2013   Procedure: COLONOSCOPY;  Surgeon: PBeryle Beams MD;  Location: [facility];  Service: Endoscopy;  Laterality: N/A;   LEFT HEART CATH AND CORONARY ANGIOGRAPHY N/A 06/23/2019   Procedure: LEFT HEART CATH AND CORONARY ANGIOGRAPHY;  Surgeon: CSherren Mocha MD;  [facility];  Service: Cardiovascular;  Laterality: N/A;   LEFT HEART CATHETERIZATION WITH CORONARY ANGIOGRAM N/A 03/08/2014   Procedure: LEFT HEART CATHETERIZATION WITH CORONARY ANGIOGRAM;  Surgeon: CBurnell Blanks MD;  [facility];  Service: Cardiovascular;  Laterality: N/A;   TONSILLECTOMY AND ADENOIDECTOMY     as a child   Patient Active Problem List   Diagnosis Date Noted   Spondylolisthesis of lumbosacral region 10/19/2020   Chronic tension-type headache, not intractable 06/23/2020   Hyperlipidemia    GERD (gastroesophageal reflux disease)    Excessive daytime sleepiness 01/12/2018   Seasonal and perennial allergic rhinitis 01/12/2018   CAD  (coronary artery disease) 03/25/2014   Pain of left calf 03/25/2014   [place] 03/09/2014   Midsternal chest pain 03/09/2014   Unstable angina ([place] 03/08/2014   Hypothyroidism 03/08/2014   Chest pain    OSA (obstructive sleep apnea) 07/27/2012    PCP: WChristain Sacramento MD  REFERRING PROVIDER: BPatricia Nettle NP  REFERRING DIAG: Z98.1 (ICD-10-CM) - Arthrodesis status  THERAPY DIAG:  Low back pain, unspecified back pain laterality, unspecified chronicity, unspecified whether sciatica present  Muscle spasm of back  Muscle weakness (generalized)  ONSET DATE: 10/19/2020  SUBJECTIVE:  SUBJECTIVE STATEMENT:  went to the ED with severe quadrant pain, ruled out everything and determined it's coming from the back.  Thinks DN would really help.  It's been almost a year since surgery.     PERTINENT HISTORY:  Back surgery on 10/19/2020, anxiety, GERD, L heart cath;  Sees MD 8/8  PAIN:     Are you having pain? Yes:  4/10  left low back.   PATIENT GOALS:  improve sleep, improve pain to allow him to return to work    OBJECTIVE:   PATIENT SURVEYS:   Modified Oswestry 22/50 =44% Disability on 06/22/2021  FOTO 47% on 07/02/2021 FOTO 60% on 5/19  Oswestry 5/12:  26%   SCREENING FOR RED FLAGS:  07/02/2021: Bowel or bladder incontinence: Yes: Pt reports has not had any since last week, had 3-4 boughts of incontinence last week. MANUAL: Soft tissue to LT lumbar and proximal glutes   In sidelying.   5/12:  No recent incontinence in last 2 weeks COGNITION:  Overall cognitive status: Within functional limits for tasks assessed      LE MMT:  MMT Right 06/22/2021 5/12 Left 06/22/2021 5/12 7/14  Hip flexion 4- 4 4- 4 4+  Hip extension       Hip abduction 4- 4 4- 4 4+  Hip adduction        Hip internal rotation       Hip external rotation       Knee flexion       Knee extension       Lumbar Flexion 4- 4 4- 4 4+  Lumbar Extension 4- 4 4- 4 4+  Ankle inversion       Ankle eversion        (Blank rows = not tested)  FUNCTIONAL TESTS:  08/24/21:  5x sit to stand chair plus 1 cushion: >30 sec today min use on thighs with difficulty stabilizing initially 07/30/2021:  5 times sit to stand: 21.5 sec with UE on thighs  06/11/2021: 5 times sit to stand: 18.9 sec with UE on thighs   TODAY'S TREATMENT  7/14: Nustep Level 5 x6 min with PT present to discuss status On counter flexion/extension ROM Sit fit cushion for home Manual: Soft tissue work to Bil lumbar and gluteals DRY NEEDLING: Dry needling consent given? yes Educational handouts provided? Previously  Muscles treated: bil lumbar multifidi, gluteals Response from dry needling: improved soft tissue mobility and pain  E-stim with DN: 3.0 ma 15 min frequency  to 30   10/12/21: Nustep Level 5 x15  min with PTA present to discuss status Manual: Soft tissue work, MFR to Bil lumbar and gluteals, Addaday assist also to glutes and hamstrings Verbal review and commitment to his walking, HEP and overall commitment to his health despite long work hours and the fatigue that comes with that.  09/28/21:Nustep Level 5 x6 min with PT present to discuss status Manual: Soft tissue work, MFR to Bil lumbar and gluteals, Addaday assist also to glutes and hamstrings. F/B IFC with moist heat to lumbar Bil to mid glute. 15 min in LT sidelying with RTLE supported on foam roll.           PATIENT EDUCATION:  Education details: Reviewed HEP and importance of meditation Person educated: Patient Education method: Explanation Education comprehension: verbalized understanding   HOME EXERCISE PROGRAM: Access Code: [email or web address] URL: [email or web address] Date: 07/02/2021 Prepared by: Juel Burrow  Exercises Seated Pelvic  Floor Contraction - 1-2 x daily - 7 x  weekly - 1 sets - 10 reps Supine Transversus Abdominis Bracing - Hands on Stomach - 1 x daily - 7 x weekly - 2-3 sets - 10 reps Supine March with Posterior Pelvic Tilt - 1-2 x daily - 7 x weekly - 1 sets - 10 reps Hooklying Sequential Leg March and Lower - 1 x daily - 7 x weekly - 2-3 sets - 10 reps Hooklying Hamstring Stretch with Strap - 2 x daily - 7 x weekly - 1 sets - 3 reps - 30 sec hold Supine Piriformis Stretch with Foot on Ground - 1-2 x daily - 7 x weekly - 1 sets - 2 reps - 20 sec hold Supine Bridge - 1-2 x daily - 7 x weekly - 1 sets - 10 reps Standing Quad Stretch with Table and Chair Support - 2 x daily - 7 x weekly - 1 sets - 3 reps - 30 sec    ASSESSMENT:  CLINICAL IMPRESSION:   The patient had a exacerbation of pain levels in his back (and even right flank) and went to the ED on 7/9.  Ruled out other causes and it was determined to be back related.  The patient requests DN combined with ES for pain and spasm relief. Antalgic gait and forward flexed posture with transitional movements.   Will see patient for pain management for a weekly to biweekly period of time but encouraged to discuss with MD his continued high pain levels and impaired function.     OBJECTIVE IMPAIRMENTS Abnormal gait, decreased activity tolerance, decreased mobility, decreased ROM, decreased strength, hypomobility, increased muscle spasms, impaired flexibility, improper body mechanics, postural dysfunction, and pain.   ACTIVITY LIMITATIONS cleaning and occupation.   PERSONAL FACTORS Age, Time since onset of injury/illness/exacerbation, and 1 comorbidity: surgery 10/19/2020  are also affecting patient's functional outcome.    REHAB POTENTIAL: Good  CLINICAL DECISION MAKING: Evolving/moderate complexity  EVALUATION COMPLEXITY: Moderate   GOALS: Goals reviewed with patient? Yes  SHORT TERM GOALS: Target date: 02/19/2022  Pt will be independent with his initial  HEP to improve flexibility and understanding of proper body mechanics. Baseline:  Goal status: MET   LONG TERM GOALS: Target date: 12/21/2021  Pt will have atleast 4/5 MMT of BLE which will improve his efficiency with walking and other ADLs. Baseline:  Goal status: IN PROGRESS  2.  Pt will report atleast 80% improvement in his back pain and function from the start of PT which will improve his ability to participate in work related tasks. Baseline:  Goal status: IN PROGRESS  3.  Pt will be able to complete 5x sit to stand with min to mod UE assist in less than 20 sec to reflect improvements in his trunk/LE strength. Baseline:  Goal status: MET  4.  Pt will be able to complete bed mobility such as rolling and supine to/from sit without the need for UE support or increase in low back pain. Baseline:  Goal status: MET  5.  The patient will have LE strength needed to rise 1x from a standard height chair with min UE assist. Baseline:  Goal status: MET  6.  Pt to decrease Modified Oswestry Index to 20% or less to demonstrate decreased functional back pain. Baseline:  Goal status: IN PROGRESS 7.  Able to lift 25# object with good form with min pain. Baseline: Goal status:  IN PROGRESS    PLAN: PT FREQUENCY: 1x/week  PT DURATION: 8 weeks  PLANNED INTERVENTIONS: Therapeutic exercises, Therapeutic activity, Neuromuscular  re-education, Balance training, Gait training, Patient/Family education, Joint mobilization, Dry Needling, Cryotherapy, Moist heat, Taping, and Manual therapy.  PLAN FOR NEXT SESSION:   Pt had a recent exacerbation including an ED visit; will continue PT for pain management and return to ex as tolerated;  MD appt 8/8   Ruben Im, PT 10/26/21 11:48 AM Phone: [phone] Fax: [phone]   PHYSICAL THERAPY DISCHARGE SUMMARY  Visits from Start of Care: 43  Current functional level related to goals / functional outcomes: The patient canceled his last few  appts stating he was unable to miss anymore work.     Remaining deficits: As above   Education / Equipment: HEP   Patient agrees to discharge. Patient goals were partially met. Patient is being discharged due to the patient's request.  Ruben Im, PT 01/24/22 7:55 AM Phone: [phone] Fax: [phone]    [facility] [address], [address] [place], [place] [address] Phone # [phone] Fax [phone]

## 2021-11-02 ENCOUNTER — Ambulatory Visit: Payer: BC Managed Care – PPO | Admitting: Physical Therapy

## 2021-11-09 ENCOUNTER — Ambulatory Visit: Payer: BC Managed Care – PPO | Admitting: Physical Therapy

## 2021-11-16 ENCOUNTER — Ambulatory Visit: Payer: BC Managed Care – PPO | Admitting: Physical Therapy

## 2021-11-23 ENCOUNTER — Encounter: Payer: BC Managed Care – PPO | Admitting: Physical Therapy

## 2022-11-27 ENCOUNTER — Other Ambulatory Visit: Payer: Self-pay | Admitting: Orthopedic Surgery

## 2022-12-26 IMAGING — RF DG C-ARM 1-60 MIN
1 series · 3 of 3 positions shown · non-contrast
Comparison: Same day radiographs.  MRI lumbar spine July 12, 2020.

CLINICAL DATA: L5-S1 PLIF.

EXAM:
DG C-ARM 1-60 MIN; LUMBAR SPINE - 2-3 VIEW
FLUOROSCOPY TIME:  Fluoroscopy Time:  25 seconds.
Radiation Exposure Index (if provided by the fluoroscopic device):
34.84 mGy.
Number of Acquired Spot Images: 2

[Series 1: run · 3 of 3 slices shown]
[im 1/3]
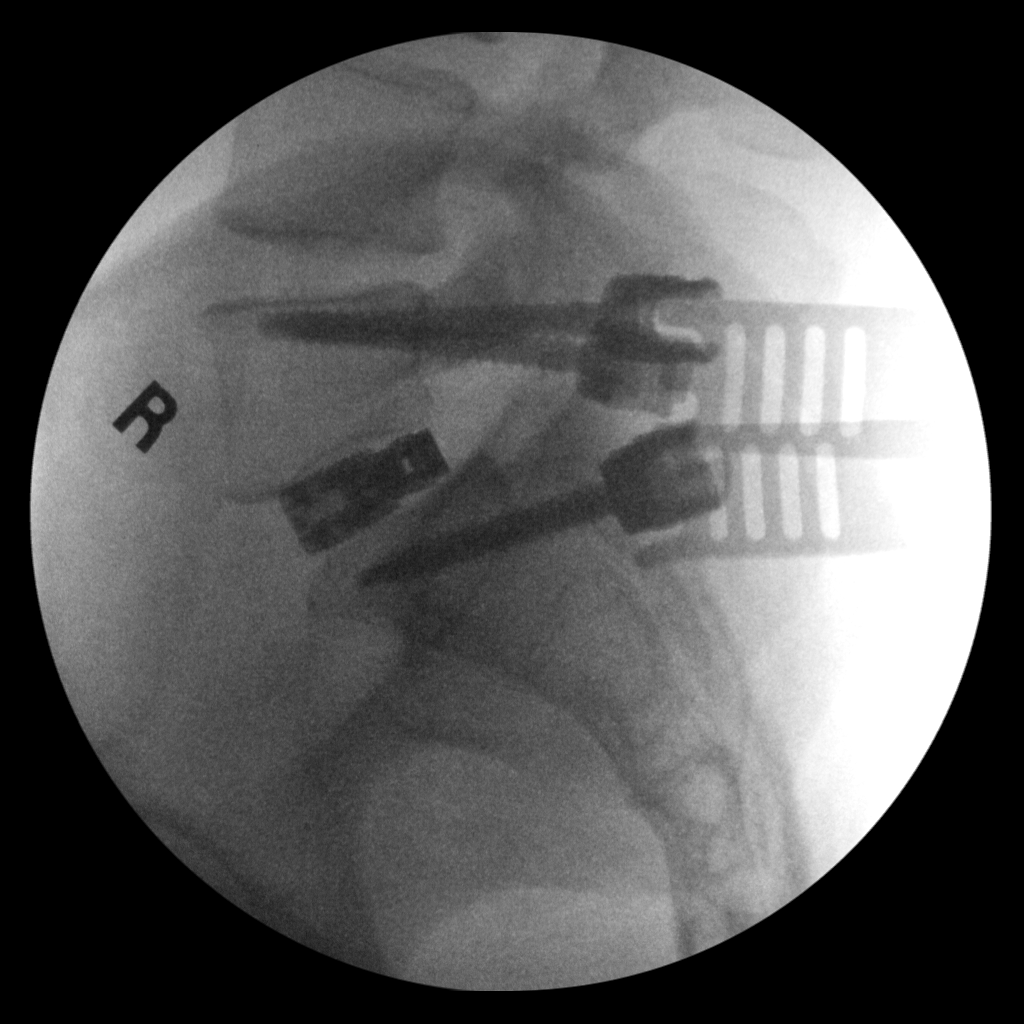
[im 2/3]
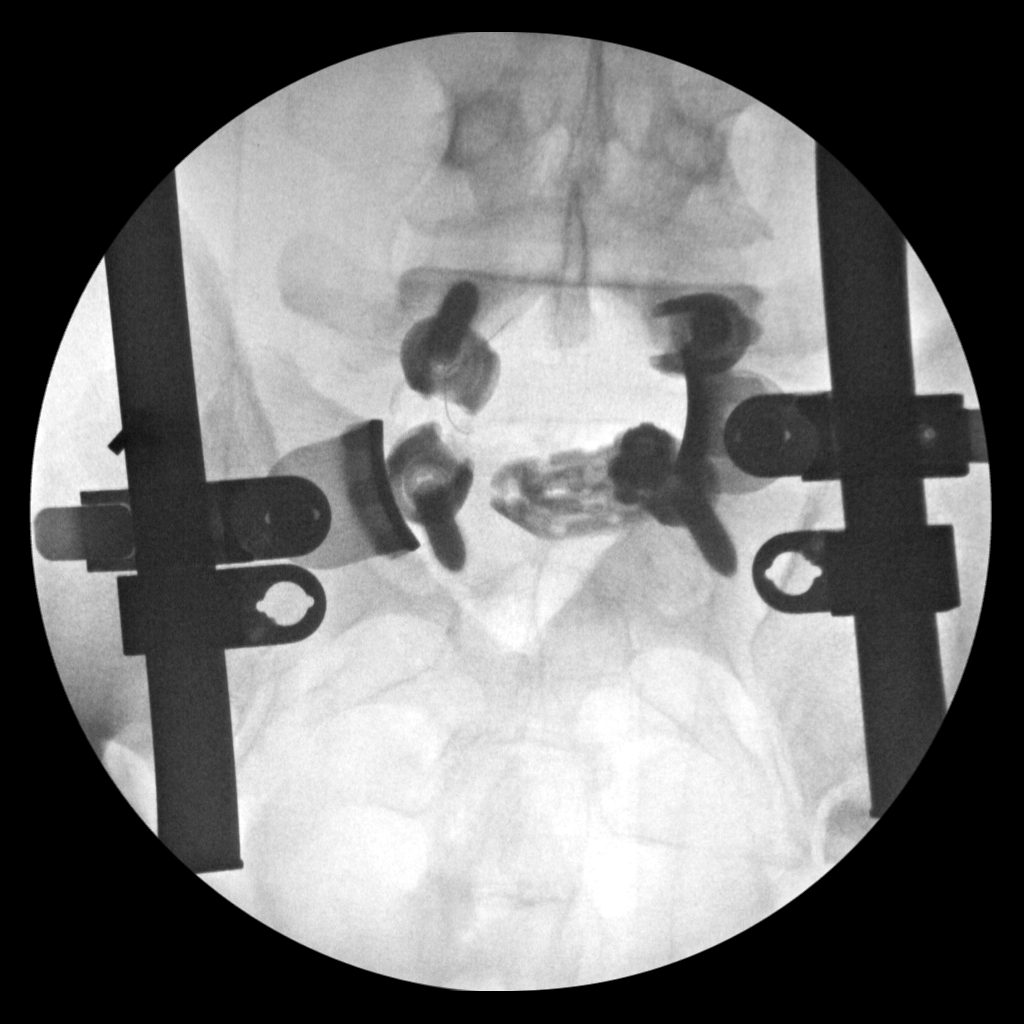
[im 3/3]
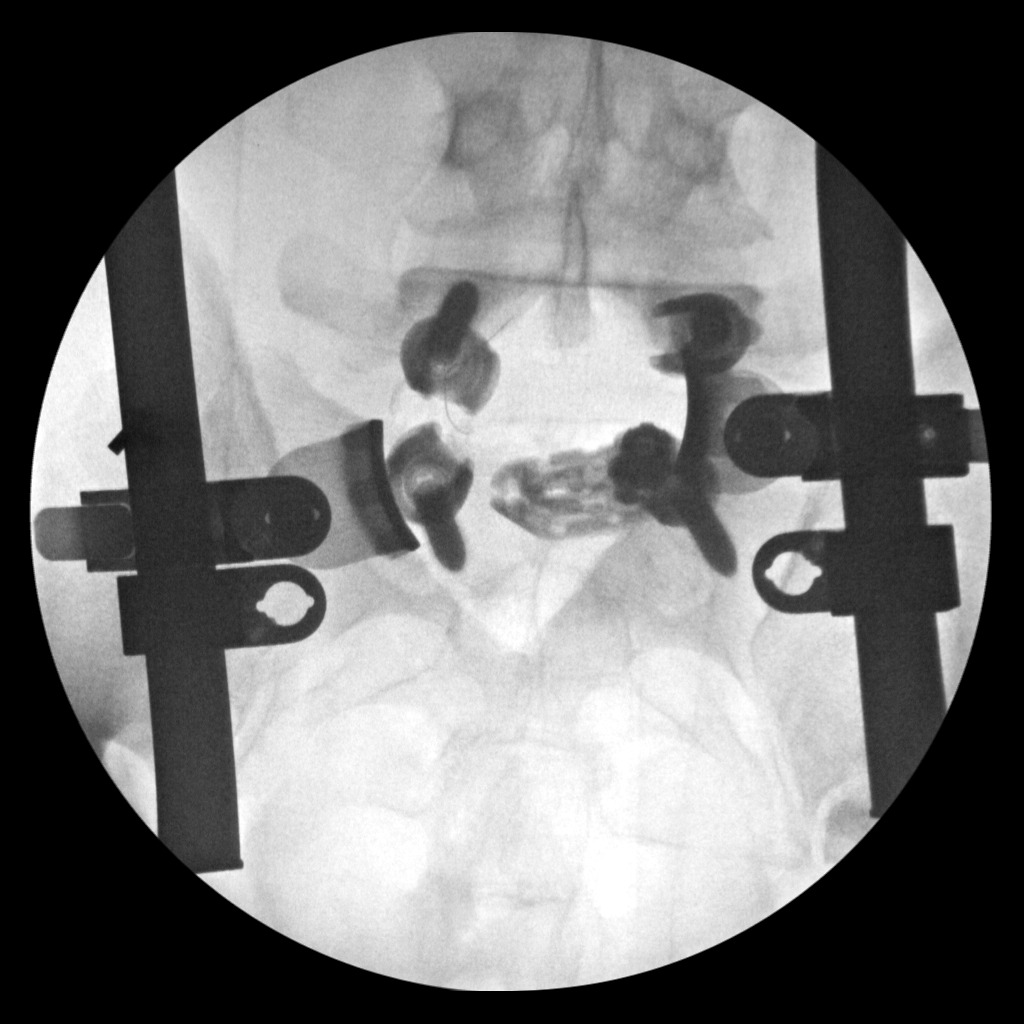

[3 of 3 positions shown; findings below may reference images not displayed]

FINDINGS: Two C-arm fluoroscopic images were obtained intraoperatively and
submitted for post operative interpretation. These images
demonstrate bilateral pedicle screws at L5 and S1. Intervening L5-S1
spacer. Surgical retractors noted posteriorly. Please see the
performing provider's procedural report for further detail.
IMPRESSION: Intraoperative imaging during L5-S1 PLIF.

## 2022-12-26 IMAGING — RF DG LUMBAR SPINE 2-3V
1 series · 3 of 3 positions shown · non-contrast
Comparison: Same day radiographs.  MRI lumbar spine July 12, 2020.

CLINICAL DATA: L5-S1 PLIF.

EXAM:
DG C-ARM 1-60 MIN; LUMBAR SPINE - 2-3 VIEW
FLUOROSCOPY TIME:  Fluoroscopy Time:  25 seconds.
Radiation Exposure Index (if provided by the fluoroscopic device):
34.84 mGy.
Number of Acquired Spot Images: 2

[Series 1: run · 3 of 3 slices shown]
[im 1/3]
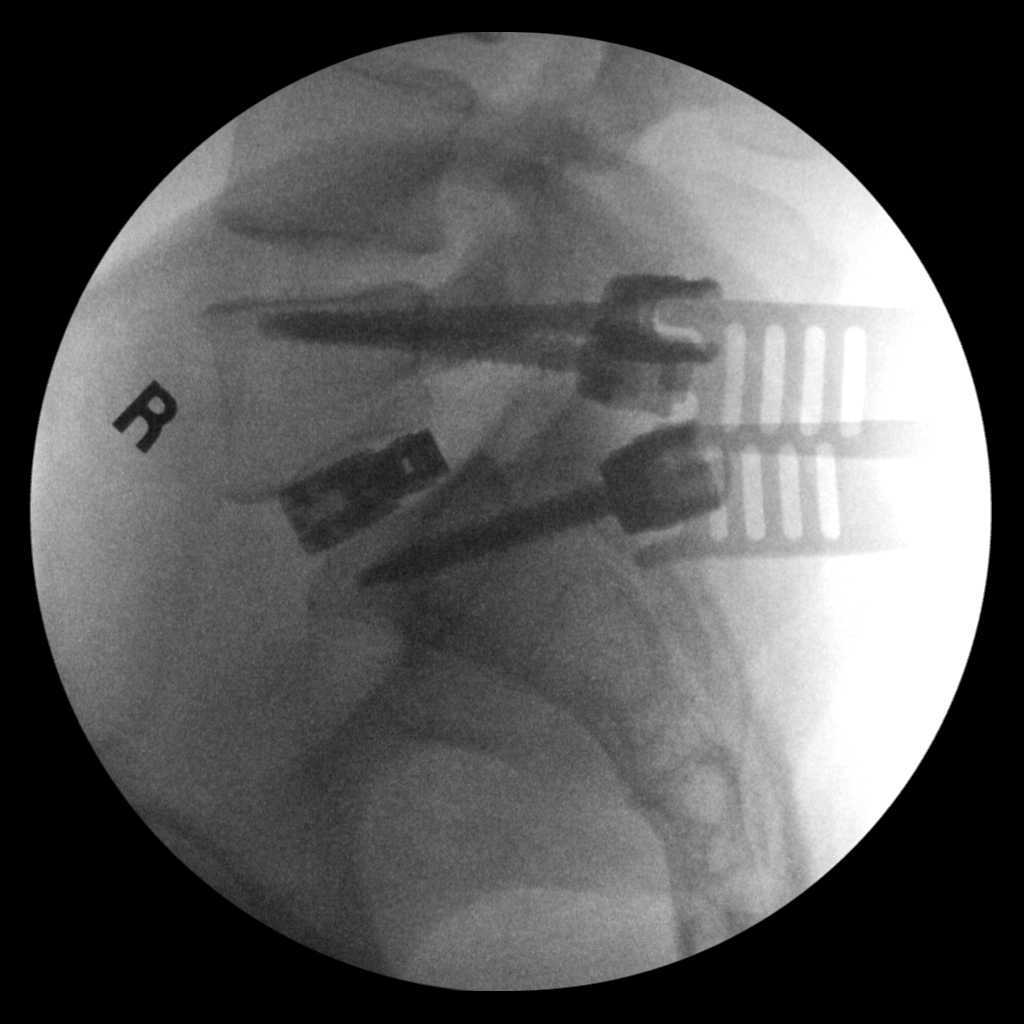
[im 2/3]
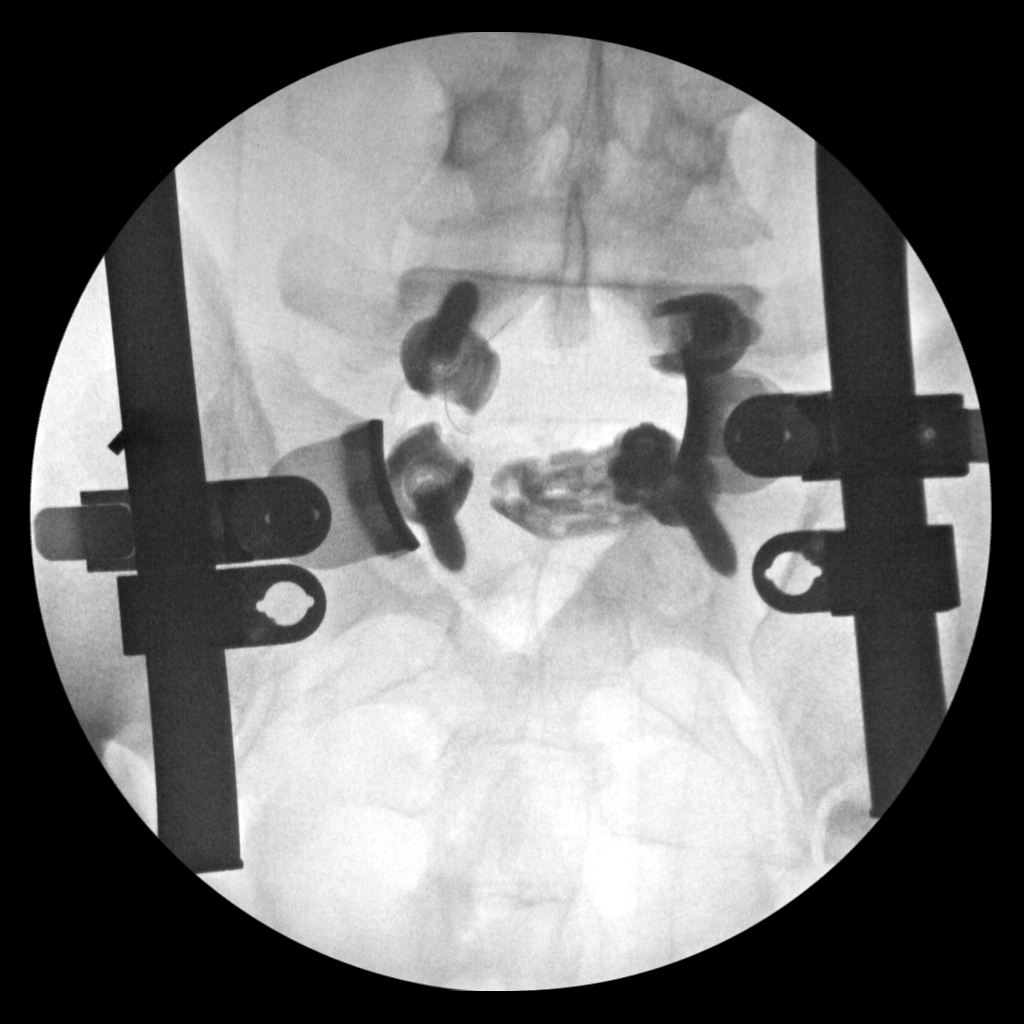
[im 3/3]
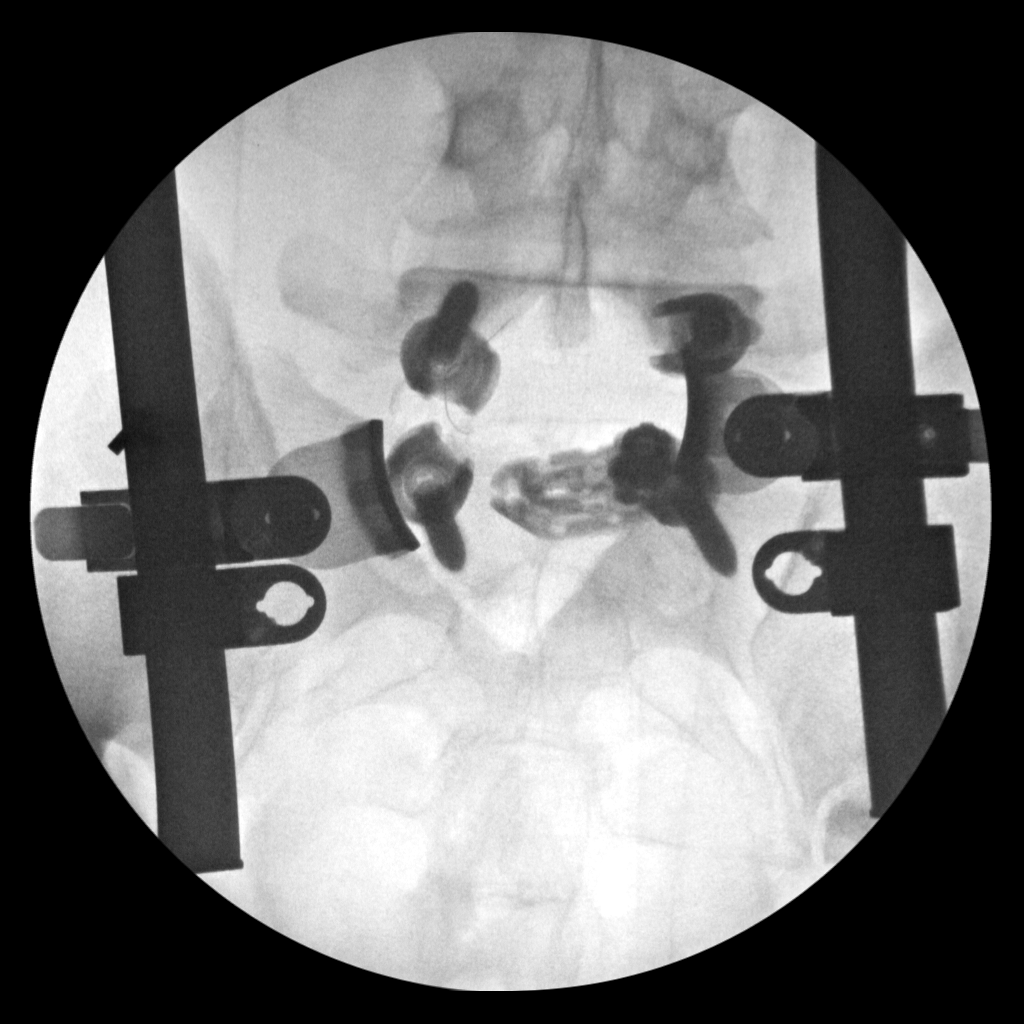

[3 of 3 positions shown; findings below may reference images not displayed]

FINDINGS: Two C-arm fluoroscopic images were obtained intraoperatively and
submitted for post operative interpretation. These images
demonstrate bilateral pedicle screws at L5 and S1. Intervening L5-S1
spacer. Surgical retractors noted posteriorly. Please see the
performing provider's procedural report for further detail.
IMPRESSION: Intraoperative imaging during L5-S1 PLIF.

## 2023-01-07 ENCOUNTER — Other Ambulatory Visit: Payer: Self-pay

## 2023-01-07 ENCOUNTER — Encounter (HOSPITAL_BASED_OUTPATIENT_CLINIC_OR_DEPARTMENT_OTHER): Payer: Self-pay | Admitting: Orthopedic Surgery

## 2023-01-07 NOTE — Progress Notes (Signed)
   01/07/23 1013  PAT Phone Screen  Is the patient taking a GLP-1 receptor agonist? No  Do You Have Diabetes? No  Do You Have Hypertension? Yes  Have You Ever Been to the ER for Asthma? No  Have You Taken Oral Steroids in the Past 3 Months? No  Do you Take Phenteramine or any Other Diet Drugs? No  Recent  Lab Work, EKG, CXR? No  Do you have a history of heart problems? (S)  Yes (non obstructive CAD)  Cardiologist Name Dr Excell Seltzer  Have you ever had tests on your heart? Yes  What cardiac tests were performed? Cardiac Cath;Echo  What date/year were cardiac tests completed? ECHO EF 60-65%, Heart cath showing non obstr CAD  Results viewable: [facility] Tab  Any Recent Hospitalizations? No  Height 6' (1.829 m)  Weight 120.2 kg  Jerry Bible Appointment Scheduled Yes

## 2023-01-13 ENCOUNTER — Other Ambulatory Visit: Payer: Self-pay

## 2023-01-13 ENCOUNTER — Ambulatory Visit (HOSPITAL_BASED_OUTPATIENT_CLINIC_OR_DEPARTMENT_OTHER)
Admission: RE | Admit: 2023-01-13 | Discharge: 2023-01-13 | Disposition: A | Discharge status: Home / Self Care | Payer: BC Managed Care – PPO | Source: Ambulatory Visit | Attending: Orthopedic Surgery | Admitting: Orthopedic Surgery

## 2023-01-13 ENCOUNTER — Encounter (HOSPITAL_BASED_OUTPATIENT_CLINIC_OR_DEPARTMENT_OTHER)
Admission: RE | Disposition: A | Discharge status: Home / Self Care | Payer: Self-pay | Source: Ambulatory Visit | Attending: Orthopedic Surgery

## 2023-01-13 ENCOUNTER — Ambulatory Visit (HOSPITAL_BASED_OUTPATIENT_CLINIC_OR_DEPARTMENT_OTHER): Payer: BC Managed Care – PPO | Admitting: Certified Registered Nurse Anesthetist

## 2023-01-13 ENCOUNTER — Encounter (HOSPITAL_BASED_OUTPATIENT_CLINIC_OR_DEPARTMENT_OTHER): Payer: Self-pay | Admitting: Orthopedic Surgery

## 2023-01-13 DIAGNOSIS — M65321 Trigger finger, right index finger: Secondary | ICD-10-CM | POA: Diagnosis not present

## 2023-01-13 DIAGNOSIS — G5601 Carpal tunnel syndrome, right upper limb: Secondary | ICD-10-CM | POA: Insufficient documentation

## 2023-01-13 DIAGNOSIS — I10 Essential (primary) hypertension: Secondary | ICD-10-CM | POA: Insufficient documentation

## 2023-01-13 DIAGNOSIS — Z79890 Hormone replacement therapy (postmenopausal): Secondary | ICD-10-CM | POA: Diagnosis not present

## 2023-01-13 DIAGNOSIS — I2510 Atherosclerotic heart disease of native coronary artery without angina pectoris: Secondary | ICD-10-CM | POA: Insufficient documentation

## 2023-01-13 DIAGNOSIS — E039 Hypothyroidism, unspecified: Secondary | ICD-10-CM | POA: Insufficient documentation

## 2023-01-13 DIAGNOSIS — G4730 Sleep apnea, unspecified: Secondary | ICD-10-CM | POA: Diagnosis not present

## 2023-01-13 DIAGNOSIS — I25119 Atherosclerotic heart disease of native coronary artery with unspecified angina pectoris: Secondary | ICD-10-CM

## 2023-01-13 DIAGNOSIS — K219 Gastro-esophageal reflux disease without esophagitis: Secondary | ICD-10-CM | POA: Insufficient documentation

## 2023-01-13 DIAGNOSIS — E785 Hyperlipidemia, unspecified: Secondary | ICD-10-CM | POA: Diagnosis not present

## 2023-01-13 DIAGNOSIS — Z87891 Personal history of nicotine dependence: Secondary | ICD-10-CM | POA: Insufficient documentation

## 2023-01-13 HISTORY — DX: Essential (primary) hypertension: I10

## 2023-01-13 HISTORY — DX: Depression, unspecified: F32.A

## 2023-01-13 HISTORY — PX: TRIGGER FINGER RELEASE: SHX641

## 2023-01-13 HISTORY — PX: CARPAL TUNNEL RELEASE: SHX101

## 2023-01-13 SURGERY — RELEASE, A1 PULLEY, FOR TRIGGER FINGER
Anesthesia: Monitor Anesthesia Care | Laterality: Right

## 2023-01-13 MED ORDER — PROPOFOL 500 MG/50ML IV EMUL
INTRAVENOUS | Status: DC | PRN
  Administered 2023-01-13: 20 mg via INTRAVENOUS
  Administered 2023-01-13: 50 ug/kg/min via INTRAVENOUS

## 2023-01-13 MED ORDER — ACETAMINOPHEN 10 MG/ML IV SOLN: 1000.0000 mg | Freq: Once | INTRAVENOUS | Status: DC | PRN

## 2023-01-13 MED ORDER — MIDAZOLAM HCL 5 MG/5ML IJ SOLN
INTRAMUSCULAR | Status: DC | PRN
  Administered 2023-01-13: 2 mg via INTRAVENOUS

## 2023-01-13 MED ORDER — FENTANYL CITRATE (PF) 100 MCG/2ML IJ SOLN: 25.0000 ug | INTRAMUSCULAR | Status: DC | PRN

## 2023-01-13 MED ORDER — FENTANYL CITRATE (PF) 100 MCG/2ML IJ SOLN
INTRAMUSCULAR | Status: DC | PRN
  Administered 2023-01-13: 50 ug via INTRAVENOUS

## 2023-01-13 MED ORDER — LIDOCAINE HCL (PF) 0.5 % IJ SOLN
INTRAMUSCULAR | Status: DC | PRN
  Administered 2023-01-13: 30 mL via INTRAVENOUS

## 2023-01-13 MED ORDER — LACTATED RINGERS IV SOLN: INTRAVENOUS | Status: DC

## 2023-01-13 MED ORDER — CEFAZOLIN SODIUM-DEXTROSE 1-4 GM/50ML-% IV SOLN
INTRAVENOUS | Status: AC
  Filled 2023-01-13: qty 50

## 2023-01-13 MED ORDER — TRAMADOL HCL 50 MG PO TABS
ORAL_TABLET | ORAL | 0 refills | Status: AC
Start: 2023-01-13 — End: ?

## 2023-01-13 MED ORDER — CEFAZOLIN SODIUM-DEXTROSE 2-4 GM/100ML-% IV SOLN
INTRAVENOUS | Status: AC
  Filled 2023-01-13: qty 100

## 2023-01-13 MED ORDER — ONDANSETRON HCL 4 MG/2ML IJ SOLN
INTRAMUSCULAR | Status: AC
  Filled 2023-01-13: qty 2

## 2023-01-13 MED ORDER — FENTANYL CITRATE (PF) 100 MCG/2ML IJ SOLN
INTRAMUSCULAR | Status: AC
  Filled 2023-01-13: qty 2

## 2023-01-13 MED ORDER — 0.9 % SODIUM CHLORIDE (POUR BTL) OPTIME
TOPICAL | Status: DC | PRN
Start: 2023-01-13 — End: 2023-01-13
  Administered 2023-01-13: 50 mL

## 2023-01-13 MED ORDER — CEFAZOLIN IN SODIUM CHLORIDE 3-0.9 GM/100ML-% IV SOLN
3.0000 g | INTRAVENOUS | Status: AC
  Administered 2023-01-13: 3 g via INTRAVENOUS

## 2023-01-13 MED ORDER — BUPIVACAINE HCL (PF) 0.25 % IJ SOLN
INTRAMUSCULAR | Status: DC | PRN
  Administered 2023-01-13: 9 mL

## 2023-01-13 MED ORDER — MIDAZOLAM HCL 2 MG/2ML IJ SOLN
INTRAMUSCULAR | Status: AC
  Filled 2023-01-13: qty 2

## 2023-01-13 MED ORDER — PROPOFOL 500 MG/50ML IV EMUL
INTRAVENOUS | Status: AC
  Filled 2023-01-13: qty 50

## 2023-01-13 MED ORDER — DROPERIDOL 2.5 MG/ML IJ SOLN: 0.6250 mg | Freq: Once | INTRAMUSCULAR | Status: DC | PRN

## 2023-01-13 MED ORDER — ONDANSETRON HCL 4 MG/2ML IJ SOLN
INTRAMUSCULAR | Status: DC | PRN
  Administered 2023-01-13: 4 mg via INTRAVENOUS

## 2023-01-13 SURGICAL SUPPLY — 39 items
APL PRP STRL LF DISP 70% ISPRP (MISCELLANEOUS) ×1
BLADE SURG 15 STRL LF DISP TIS (BLADE) ×4 IMPLANT
BLADE SURG 15 STRL SS (BLADE) ×2
BNDG CMPR 5X2 CHSV 1 LYR STRL (GAUZE/BANDAGES/DRESSINGS) ×1
BNDG CMPR 5X3 KNIT ELC UNQ LF (GAUZE/BANDAGES/DRESSINGS) ×1
BNDG CMPR 9X4 STRL LF SNTH (GAUZE/BANDAGES/DRESSINGS)
BNDG COHESIVE 2X5 TAN ST LF (GAUZE/BANDAGES/DRESSINGS) ×2 IMPLANT
BNDG ELASTIC 3INX 5YD STR LF (GAUZE/BANDAGES/DRESSINGS) ×2 IMPLANT
BNDG ESMARK 4X9 LF (GAUZE/BANDAGES/DRESSINGS) IMPLANT
BNDG GAUZE DERMACEA FLUFF 4 (GAUZE/BANDAGES/DRESSINGS) ×2 IMPLANT
BNDG GZE DERMACEA 4 6PLY (GAUZE/BANDAGES/DRESSINGS) ×1
CHLORAPREP W/TINT 26 (MISCELLANEOUS) ×2 IMPLANT
CORD BIPOLAR FORCEPS 12FT (ELECTRODE) ×2 IMPLANT
COVER BACK TABLE 60X90IN (DRAPES) ×2 IMPLANT
COVER MAYO STAND STRL (DRAPES) ×2 IMPLANT
CUFF TOURN SGL QUICK 18X4 (TOURNIQUET CUFF) ×2 IMPLANT
DRAPE EXTREMITY T 121X128X90 (DISPOSABLE) ×2 IMPLANT
DRAPE SURG 17X23 STRL (DRAPES) ×2 IMPLANT
GAUZE PAD ABD 8X10 STRL (GAUZE/BANDAGES/DRESSINGS) ×2 IMPLANT
GAUZE SPONGE 4X4 12PLY STRL (GAUZE/BANDAGES/DRESSINGS) ×2 IMPLANT
GAUZE XEROFORM 1X8 LF (GAUZE/BANDAGES/DRESSINGS) ×2 IMPLANT
GLOVE BIO SURGEON STRL SZ7.5 (GLOVE) ×2 IMPLANT
GLOVE BIOGEL PI IND STRL 8 (GLOVE) ×2 IMPLANT
GLOVE SURG SYN 7.5 E (GLOVE) ×1 IMPLANT
GLOVE SURG SYN 7.5 PF PI (GLOVE) IMPLANT
GOWN STRL REUS W/ TWL LRG LVL3 (GOWN DISPOSABLE) ×2 IMPLANT
GOWN STRL REUS W/TWL LRG LVL3 (GOWN DISPOSABLE) ×1
GOWN STRL REUS W/TWL XL LVL3 (GOWN DISPOSABLE) ×2 IMPLANT
NDL HYPO 25X1 1.5 SAFETY (NEEDLE) ×2 IMPLANT
NEEDLE HYPO 25X1 1.5 SAFETY (NEEDLE) ×1 IMPLANT
NS IRRIG 1000ML POUR BTL (IV SOLUTION) ×2 IMPLANT
PACK BASIN DAY SURGERY FS (CUSTOM PROCEDURE TRAY) ×2 IMPLANT
PADDING CAST ABS COTTON 4X4 ST (CAST SUPPLIES) ×2 IMPLANT
STOCKINETTE 4X48 STRL (DRAPES) ×2 IMPLANT
SUT ETHILON 4 0 PS 2 18 (SUTURE) ×2 IMPLANT
SYR BULB EAR ULCER 3OZ GRN STR (SYRINGE) ×2 IMPLANT
SYR CONTROL 10ML LL (SYRINGE) ×2 IMPLANT
TOWEL GREEN STERILE FF (TOWEL DISPOSABLE) ×4 IMPLANT
UNDERPAD 30X36 HEAVY ABSORB (UNDERPADS AND DIAPERS) ×2 IMPLANT

## 2023-01-13 NOTE — Op Note (Signed)
01/13/2023 [facility]                              OPERATIVE REPORT   PREOPERATIVE DIAGNOSIS: 1.  Right carpal tunnel syndrome 2.  Right index finger trigger digit  POSTOPERATIVE DIAGNOSIS: 1.  Right carpal tunnel syndrome 2.  Right index trigger digit  PROCEDURE: 1.  Right carpal tunnel release 2.  Right index finger trigger release  SURGEON:  Betha Loa, MD  ASSISTANT:  none.  ANESTHESIA: Bier block with sedation  IV FLUIDS:  Per anesthesia flow sheet.  ESTIMATED BLOOD LOSS:  Minimal.  COMPLICATIONS:  None.  SPECIMENS:  None.  TOURNIQUET TIME:    Total Tourniquet Time Documented: Forearm (Right) - 34 minutes Total: Forearm (Right) - 34 minutes   DISPOSITION:  Stable to PACU.  LOCATION: [facility]  INDICATIONS:  62 y.o. yo male with numbness and tingling right hand and triggering of right index finger.  Positive nerve conduction studies. He wishes to proceed with right carpal tunnel release and right index finger trigger release.  Risks, benefits and alternatives of surgery were discussed including the risk of blood loss; infection; damage to nerves, vessels, tendons, ligaments, bone; failure of surgery; need for additional surgery; complications with wound healing; continued pain; recurrence of carpal tunnel syndrome; and damage to motor branch. He voiced understanding of these risks and elected to proceed.   OPERATIVE COURSE:  After being identified preoperatively by myself, the patient and I agreed upon the procedure and site of procedure.  The surgical site was marked.  Surgical consent had been signed.  He was given IV Ancef as preoperative antibiotic prophylaxis.  He was transferred to the operating room and placed on the operating room table in supine position with the right upper extremity on an armboard.  Bier block anesthesia was induced by the anesthesiologist.  Right upper extremity was prepped and draped in normal sterile  orthopaedic fashion.  A surgical pause was performed between the surgeons, anesthesia, and operating room staff, and all were in agreement as to the patient, procedure, and site of procedure.  Tourniquet at the proximal aspect of the forearm had been inflated for the Bier block  Incision was made over the transverse carpal ligament and carried into the subcutaneous tissues by spreading technique.  Bipolar electrocautery was used to obtain hemostasis.  The palmar fascia was sharply incised.  The transverse carpal ligament was identified.  The fascia distal to the ligament was opened.  Retractor was placed and the flexor tendons were identified.  The flexor tendon to the little finger was identified and retracted radially.  The transverse carpal ligament was then incised from distal to proximal under direct visualization.  Scissors were used to split the distal aspect of the volar antebrachial fascia.  A finger was placed into the wound to ensure complete decompression, which was the case.  The nerve was examined.  It was flattened and hyperemic. It was adherent to the radial leaflet.  The motor branch was identified and was intact.  The wound was copiously irrigated with sterile saline.  It was then closed with 4-0 nylon in a horizontal mattress fashion.  An incision was made at the volar aspect of the MP joint of the index finger.  This was carried into the subcutaneous tissues by spreading technique.  Bipolar electrocautery was used to obtain hemostasis.  The radial and ulnar digital nerves were protected throughout  the case. The flexor sheath was identified.  The A1 pulley was identified and sharply incised.  It was released in its entirety.  The proximal 1-2 mm of the A2 pulley was vented to allow better excursion of the tendons.  The finger was placed through a range of motion and there was noted to be no catching.  The tendons were brought through the wound and any adherences released.  The wound was then  copiously irrigated with sterile saline. It was closed with 4-0 nylon in a horizontal mattress fashion.  The wounds were injected with 0.25% plain Marcaine to aid in postoperative analgesia.  They were dressed with sterile Xeroform, 4x4s, an ABD, and wrapped with Kerlix and an Ace bandage.  Tourniquet was deflated at 34 minutes.  Fingertips were pink with brisk capillary refill after deflation of the tourniquet.  Operative drapes were broken down.  The patient was awoken from anesthesia safely.  He was transferred back to stretcher and taken to the PACU in stable condition.  I will see him back in the office in 1 week for postoperative followup.  I will give him a prescription for Tramadol 50 mg 1-2 tabs PO q6 hours prn pain, dispense # 20.    Betha Loa, MD Electronically signed, 01/13/23

## 2023-01-13 NOTE — Transfer of Care (Signed)
Immediate Anesthesia Transfer of Care Note  Patient: Jerry Keller  Procedure(s) Performed: RELEASE TRIGGER FINGER/A-1 PULLEY RIGHT INDEX FINGER (Right) RIGHT CARPAL TUNNEL RELEASE (Right)  Patient Location: PACU  Anesthesia Type:MAC and Bier block  Level of Consciousness: awake, alert , and oriented  Airway & Oxygen Therapy: Patient Spontanous Breathing  Post-op Assessment: Report given to RN and Post -op Vital signs reviewed and stable  Post vital signs: Reviewed and stable  Last Vitals:  Vitals Value Taken Time  BP    Temp    Pulse    Resp    SpO2      Last Pain:  Vitals:   01/13/23 1147  TempSrc: Temporal  PainSc: 0-No pain         Complications: No notable events documented.

## 2023-01-13 NOTE — H&P (Signed)
Jerry Keller is an 62 y.o. male.   Chief Complaint: carpal tunnel syndrome, trigger digit HPI: 62 y.o. yo male with numbness and tingling right hand.  Positive nerve conduction studies. He wishes to have right carpal tunnel release. He also has triggering of the index finger and wishes to have surgical trigger release as well.  Allergies:  Allergies  Allergen Reactions   Latex Rash    Past Medical History:  Diagnosis Date   Anxiety    Depression    GERD (gastroesophageal reflux disease)    Headache, cluster    Hyperlipidemia    a. Mild - statin started 02/2014.   Hypertension    Hypothyroidism    Non-obstructive CAD    a. 02/2014 Cath: LM nl, LAD 53m/d, LCX nl, RCA 20p/m, EF 55-60%-->Med Rx.   Palpitations    none recently   Sleep apnea    uses cpap    Past Surgical History:  Procedure Laterality Date   COLONOSCOPY N/A 07/30/2013   Procedure: COLONOSCOPY;  Surgeon: Theda Belfast, MD;  Location: [facility];  Service: Endoscopy;  Laterality: N/A;   LEFT HEART CATH AND CORONARY ANGIOGRAPHY N/A 06/23/2019   Procedure: LEFT HEART CATH AND CORONARY ANGIOGRAPHY;  Surgeon: Tonny Bollman, MD;  Location: [facility];  Service: Cardiovascular;  Laterality: N/A;   LEFT HEART CATHETERIZATION WITH CORONARY ANGIOGRAM N/A 03/08/2014   Procedure: LEFT HEART CATHETERIZATION WITH CORONARY ANGIOGRAM;  Surgeon: Kathleene Hazel, MD;  Location: [facility] C[facility] LAB;  Service: Cardiovascular;  Laterality: N/A;   TONSILLECTOMY AND ADENOIDECTOMY     as a child    Family History: Family History  Problem Relation Age of Onset   Hypertension Mother    Hyperlipidemia Father    Hypertension Father    Prostate cancer Father    Sleep apnea Father    Heart attack Maternal Uncle 30   Heart attack Maternal Grandfather 16    Social History:   reports that he quit smoking about 22 years ago. His smoking use included cigarettes. He started smoking about 28 years ago. He has a 6 pack-year  smoking history. He has never used smokeless tobacco. He reports that he does not currently use alcohol. He reports that he does not use drugs.  Medications: Medications Prior to Admission  Medication Sig Dispense Refill   ALPRAZolam (XANAX) 0.5 MG tablet Take 0.5 mg by mouth 3 (three) times daily as needed for anxiety.     aspirin EC 81 MG tablet Take 81 mg by mouth daily.     buPROPion (WELLBUTRIN XL) 300 MG 24 hr tablet Take 300 mg by mouth daily.     Cholecalciferol (VITAMIN D3) 50 MCG (2000 UT) TABS Take 2,000 Units by mouth daily after breakfast.     citalopram (CELEXA) 20 MG tablet Take 20 mg by mouth daily.     levothyroxine (SYNTHROID) 112 MCG tablet Take 100 mcg by mouth daily before breakfast.     metoprolol tartrate (LOPRESSOR) 50 MG tablet Take 50 mg by mouth in the morning.     tamsulosin (FLOMAX) 0.4 MG CAPS capsule Take 0.4 mg by mouth daily after breakfast.     albuterol (VENTOLIN HFA) 108 (90 Base) MCG/ACT inhaler Inhale into the lungs every 6 (six) hours as needed for wheezing or shortness of breath.     esomeprazole (NEXIUM) 20 MG capsule Take 20 mg by mouth daily as needed (for reflux).     fluticasone (FLONASE) 50 MCG/ACT nasal spray Place 1-2 sprays into  both nostrils at bedtime as needed (congestion).      folic acid (FOLVITE) 400 MCG tablet Take 400 mcg by mouth daily.     HYDROcodone-acetaminophen (NORCO) 7.5-325 MG tablet Take 1 tablet by mouth every 6 (six) hours as needed for moderate pain.     Magnesium 250 MG TABS Take 250 mg by mouth daily.      PRESCRIPTION MEDICATION See admin instructions. CPAP- At bedtime and during naps     pyridOXINE (VITAMIN B6) 100 MG tablet Take 100 mg by mouth daily.     rosuvastatin (CRESTOR) 10 MG tablet Take 10 mg by mouth daily.     vitamin B-12 (CYANOCOBALAMIN) 1000 MCG tablet Take 1,000 mcg by mouth daily.      No results found for this or any previous visit (from the past 48 hour(s)).  No results found.    Blood pressure  131/81, pulse (!) 57, temperature (!) 97.4 F (36.3 C), temperature source Temporal, resp. rate 18, height 6' (1.829 m), weight 120.2 kg, SpO2 98%.  General appearance: alert, cooperative, and appears stated age Head: Normocephalic, without obvious abnormality, atraumatic Neck: supple, symmetrical, trachea midline Extremities: Intact sensation and capillary refill all digits.  +epl/fpl/io.  No wounds.  Pulses: 2+ and symmetric Skin: Skin color, texture, turgor normal. No rashes or lesions Neurologic: Grossly normal Incision/Wound: none  Assessment/Plan Right carpal tunnel syndrome and index finger trigger digit.  Non operative and operative treatment options have been discussed with the patient and patient wishes to proceed with operative treatment. Risks, benefits and alternatives of surgery were discussed including risks of blood loss, infection, damage to nerves/vessels/tendons/ligament/bone, failure of surgery, need for additional surgery, complication with wound healing, stiffness, recurrence, damage to motor branch.  He voiced understanding of these risks and elected to proceed.    Betha Loa 01/13/2023, 12:54 PM

## 2023-01-13 NOTE — Anesthesia Postprocedure Evaluation (Signed)
Anesthesia Post Note  Patient: Jerry Keller  Procedure(s) Performed: RELEASE TRIGGER FINGER/A-1 PULLEY RIGHT INDEX FINGER (Right) RIGHT CARPAL TUNNEL RELEASE (Right)     Patient location during evaluation: PACU Anesthesia Type: MAC and Bier Block Level of consciousness: awake and alert Pain management: pain level controlled Vital Signs Assessment: post-procedure vital signs reviewed and stable Respiratory status: spontaneous breathing, nonlabored ventilation, respiratory function stable and patient connected to nasal cannula oxygen Cardiovascular status: stable and blood pressure returned to baseline Postop Assessment: no apparent nausea or vomiting Anesthetic complications: no   No notable events documented.  Last Vitals:  Vitals:   01/13/23 1415 01/13/23 1430  BP: (!) 123/90 134/83  Pulse: 60 (!) 58  Resp: 16 16  Temp:  (!) 36.2 C  SpO2: 95% 95%    Last Pain:  Vitals:   01/13/23 1430  TempSrc:   PainSc: 0-No pain                 Earl Lites P Rosario Kushner

## 2023-01-13 NOTE — Progress Notes (Addendum)
Patient came straight to phase 2 from OR to DC.

## 2023-01-13 NOTE — Anesthesia Preprocedure Evaluation (Addendum)
Anesthesia Evaluation  Patient identified by MRN, date of birth, ID band Patient awake    Reviewed: Allergy & Precautions, NPO status , Patient's Chart, lab work & pertinent test results  Airway Mallampati: II  TM Distance: >3 FB Neck ROM: Full    Dental no notable dental hx.    Pulmonary sleep apnea and Continuous Positive Airway Pressure Ventilation , former smoker   Pulmonary exam normal        Cardiovascular hypertension, Pt. on medications and Pt. on home beta blockers + CAD   Rhythm:Regular Rate:Normal     Neuro/Psych   Anxiety Depression       GI/Hepatic Neg liver ROS,GERD  ,,  Endo/Other  Hypothyroidism    Renal/GU negative Renal ROS  negative genitourinary   Musculoskeletal negative musculoskeletal ROS (+)    Abdominal Normal abdominal exam  (+)   Peds  Hematology negative hematology ROS (+)   Anesthesia Other Findings   Reproductive/Obstetrics                             Anesthesia Physical Anesthesia Plan  ASA: 3  Anesthesia Plan: MAC and Bier Block and Bier Block-Lidocaine Only   Post-op Pain Management:    Induction: Intravenous  PONV Risk Score and Plan: 1 and Ondansetron, Dexamethasone and Treatment may vary due to age or medical condition  Airway Management Planned: Simple Face Mask and Nasal Cannula  Additional Equipment: None  Intra-op Plan:   Post-operative Plan:   Informed Consent: I have reviewed the patients History and Physical, chart, labs and discussed the procedure including the risks, benefits and alternatives for the proposed anesthesia with the patient or authorized representative who has indicated his/her understanding and acceptance.     Dental advisory given  Plan Discussed with: CRNA  Anesthesia Plan Comments:        Anesthesia Quick Evaluation

## 2023-01-13 NOTE — Discharge Instructions (Addendum)

## 2023-01-13 NOTE — Anesthesia Procedure Notes (Signed)
Anesthesia Regional Block: Bier block (IV Regional)   Pre-Anesthetic Checklist: , timeout performed,  Correct Patient, Correct Site, Correct Laterality,  Correct Procedure,, site marked,  Surgical consent,  At surgeon's request  Laterality: Right and Lower         Needles:  Injection technique: Single-shot  Needle Type: Other      Needle Gauge: 22     Additional Needles:   Procedures:,,,,, intact distal pulses, Esmarch exsanguination,  Single tourniquet utilized    Narrative:  Start time: 01/13/2023 1:16 PM End time: 01/13/2023 1:16 PM Injection made incrementally with aspirations every 30 mL.  Performed by: Personally

## 2023-01-14 ENCOUNTER — Encounter (HOSPITAL_BASED_OUTPATIENT_CLINIC_OR_DEPARTMENT_OTHER): Payer: Self-pay | Admitting: Orthopedic Surgery

## 2023-01-21 ENCOUNTER — Other Ambulatory Visit: Payer: Self-pay | Admitting: Student

## 2023-01-21 DIAGNOSIS — R2681 Unsteadiness on feet: Secondary | ICD-10-CM

## 2023-01-22 ENCOUNTER — Other Ambulatory Visit: Payer: Self-pay | Admitting: Student

## 2023-01-22 DIAGNOSIS — M5416 Radiculopathy, lumbar region: Secondary | ICD-10-CM

## 2023-01-27 ENCOUNTER — Ambulatory Visit
Admission: RE | Admit: 2023-01-27 | Discharge: 2023-01-27 | Disposition: A | Discharge status: Home / Self Care | Payer: BC Managed Care – PPO | Source: Ambulatory Visit | Attending: Student | Admitting: Student

## 2023-01-27 DIAGNOSIS — R2681 Unsteadiness on feet: Secondary | ICD-10-CM

## 2023-01-30 NOTE — Discharge Instructions (Signed)

## 2023-01-31 ENCOUNTER — Ambulatory Visit
Admission: RE | Admit: 2023-01-31 | Discharge: 2023-01-31 | Disposition: A | Discharge status: Home / Self Care | Payer: BC Managed Care – PPO | Source: Ambulatory Visit | Attending: Student | Admitting: Student

## 2023-01-31 DIAGNOSIS — M5416 Radiculopathy, lumbar region: Secondary | ICD-10-CM

## 2023-01-31 MED ORDER — MEPERIDINE HCL 50 MG/ML IJ SOLN: 50.0000 mg | Freq: Once | INTRAMUSCULAR | Status: DC | PRN

## 2023-01-31 MED ORDER — ONDANSETRON HCL 4 MG/2ML IJ SOLN: 4.0000 mg | Freq: Once | INTRAMUSCULAR | Status: DC | PRN

## 2023-01-31 MED ORDER — IOPAMIDOL (ISOVUE-M 200) INJECTION 41%
20.0000 mL | Freq: Once | INTRAMUSCULAR | Status: AC
  Administered 2023-01-31: 20 mL via INTRATHECAL

## 2023-01-31 MED ORDER — DIAZEPAM 5 MG PO TABS
10.0000 mg | ORAL_TABLET | Freq: Once | ORAL | Status: AC
  Administered 2023-01-31: 5 mg via ORAL

## 2023-04-13 IMAGING — MR MR LUMBAR SPINE WO/W CM
5 of 7 series · 27 of 48 positions shown · IV contrast (multihance)
Comparison: 07/12/2020

CLINICAL DATA: Low back pain with weakness radiating into both
sides.

EXAM:
MRI LUMBAR SPINE WITHOUT AND WITH CONTRAST
TECHNIQUE: Multiplanar and multiecho pulse sequences of the lumbar spine were
obtained without and with intravenous contrast.
CONTRAST:  20mL MULTIHANCE GADOBENATE DIMEGLUMINE 529 MG/ML IV SOLN

[Series 11: T2 · axial · 4.0mm · 0.35mm/px · z∈[-66,+166]mm · 11 of 45 slices shown]
[im 1/45]
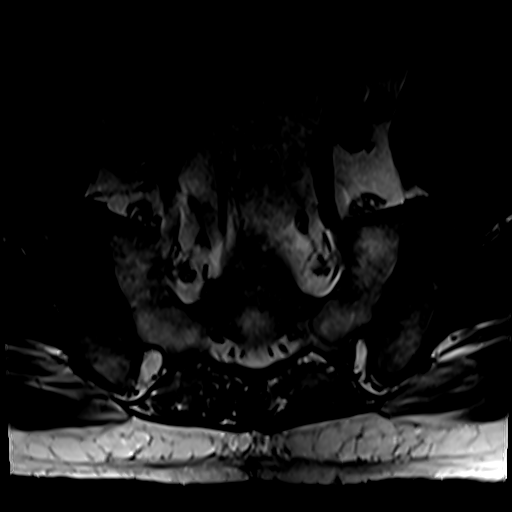
[im 5/45]
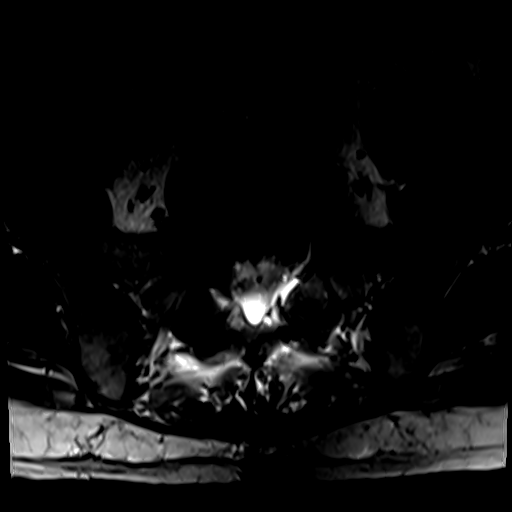
[im 9/45]
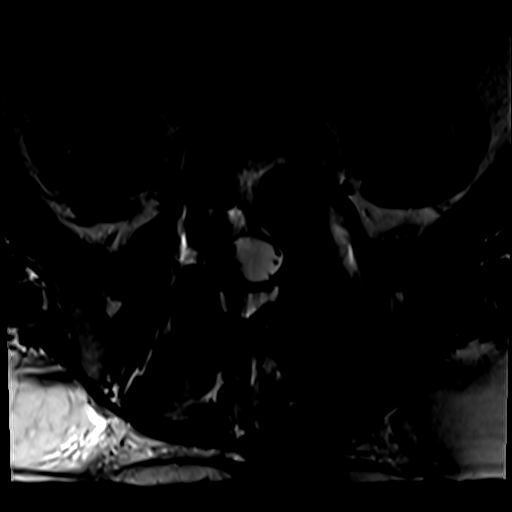
[im 14/45]
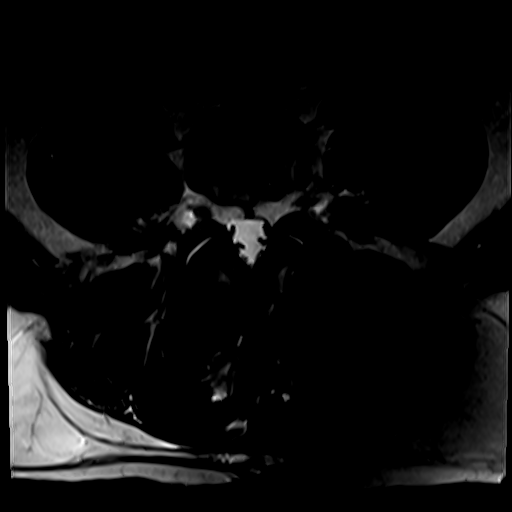
[im 18/45]
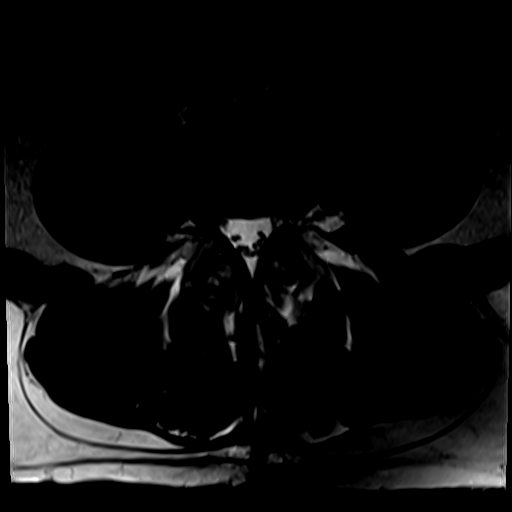
[im 23/45]
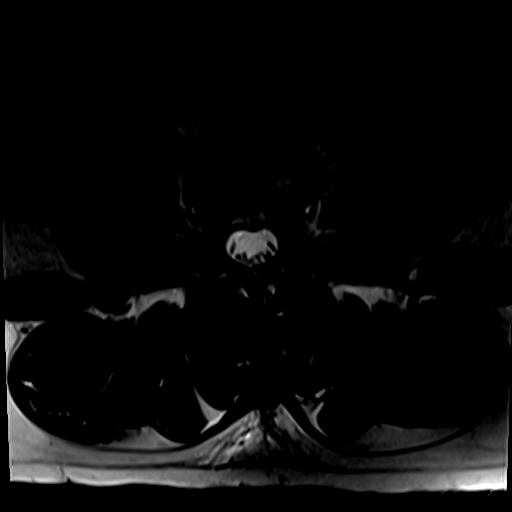
[im 27/45]
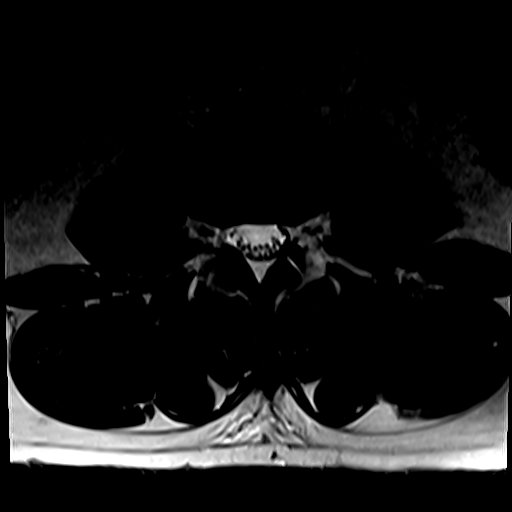
[im 31/45]
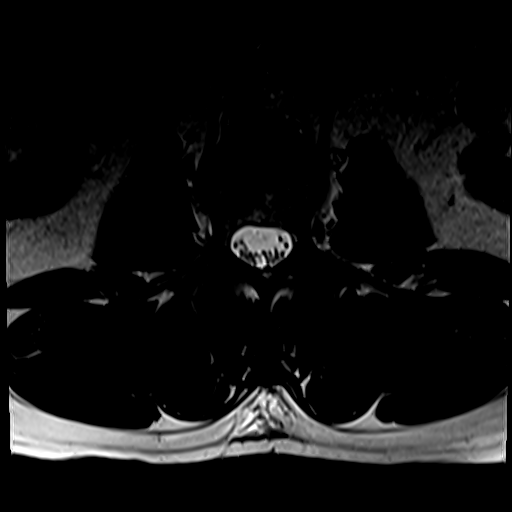
[im 36/45]
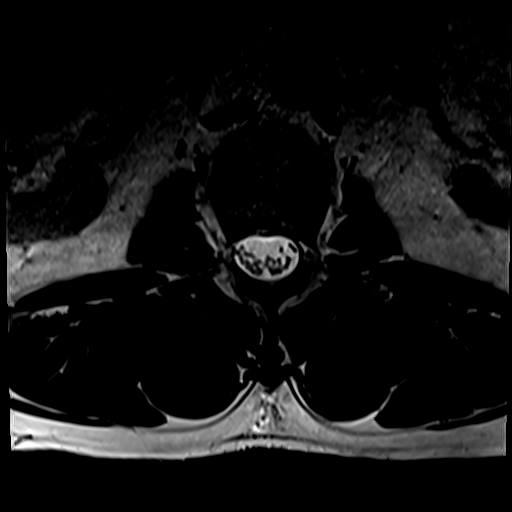
[im 40/45]
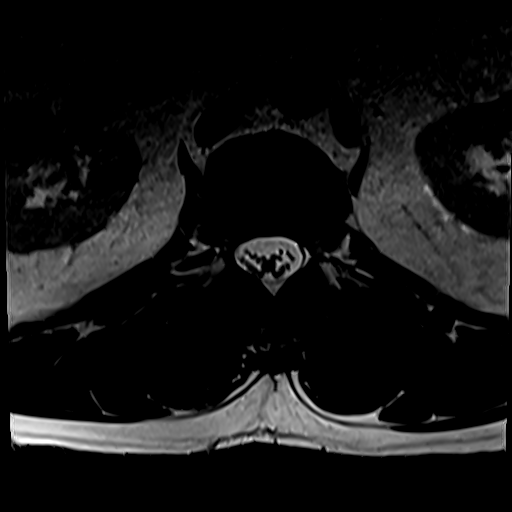
[im 45/45]
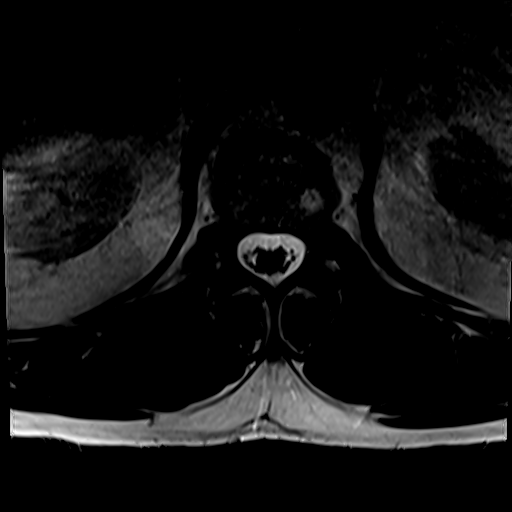

[Series 12: T1 · sagittal · 4.0mm · 1.09mm/px · 4 of 18 slices shown (1 of 2)]
[im 1/18]
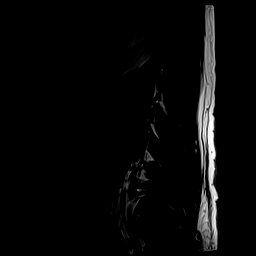
[im 6/18]
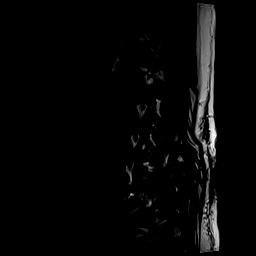
[im 12/18]
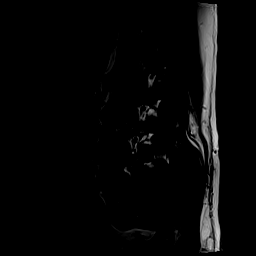
[im 18/18]
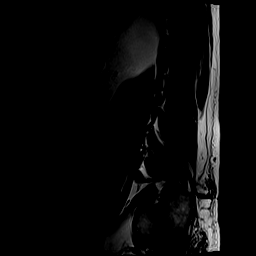

[Series 13: T2 post-contrast · sagittal · 4.0mm · 0.88mm/px · 4 of 18 slices shown]
[im 1/18]
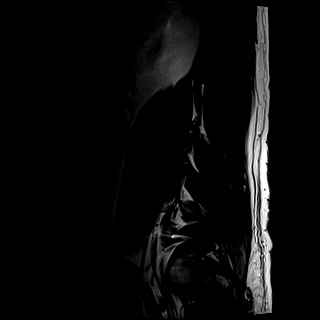
[im 6/18]
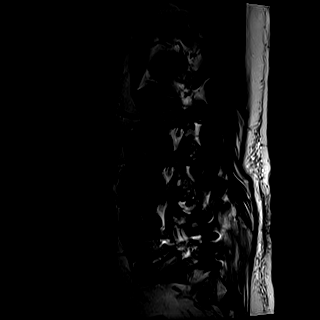
[im 12/18]
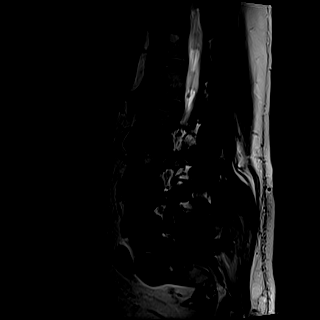
[im 18/18]
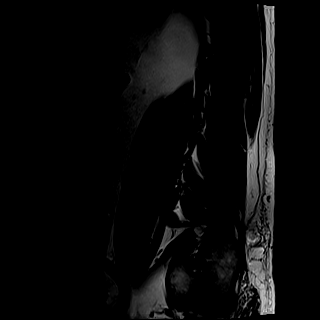

[Series 14: T1 fat-sat post-contrast · sagittal · 4.0mm · 1.09mm/px · 4 of 18 slices shown]
[im 1/18]
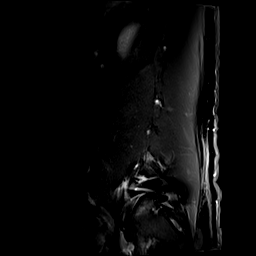
[im 6/18]
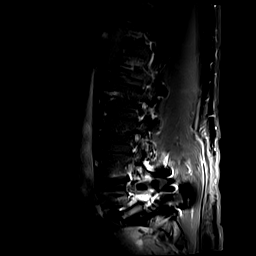
[im 12/18]
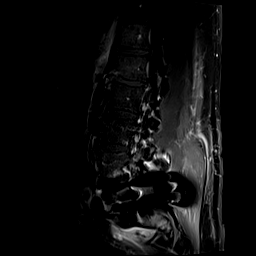
[im 18/18]
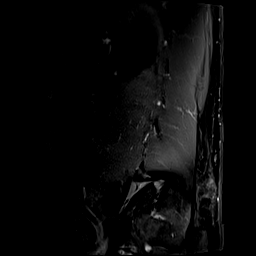

[Series 100: T1 · axial · 4.0mm · 0.35mm/px · z∈[-66,+19]mm · 4 of 45 slices shown (2 of 2)]
[im 1/45]
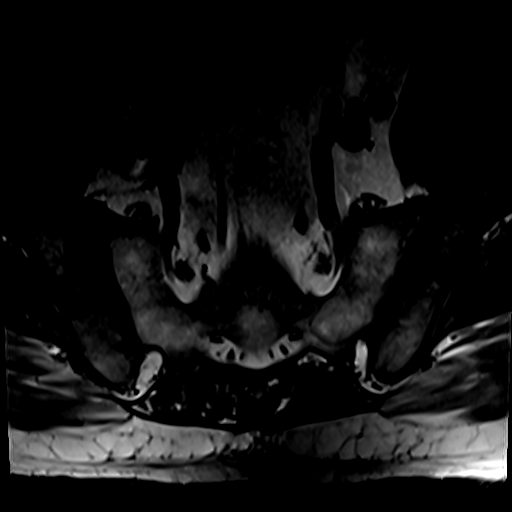
[im 9/45]
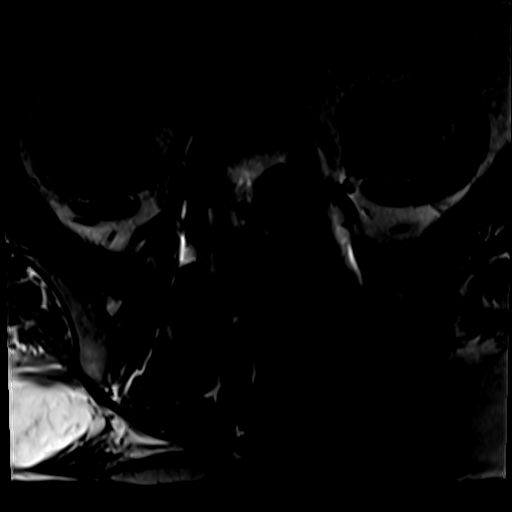
[im 14/45]
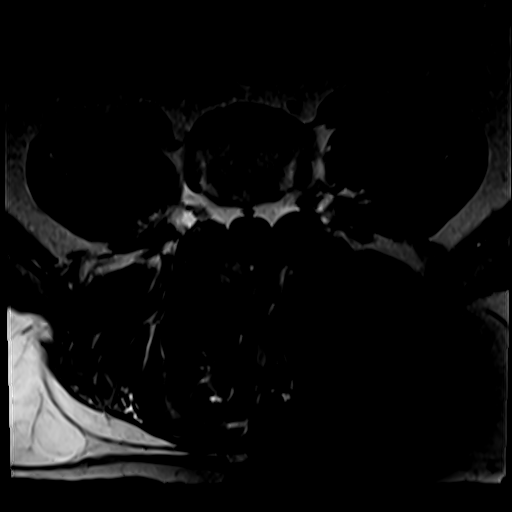
[im 18/45]
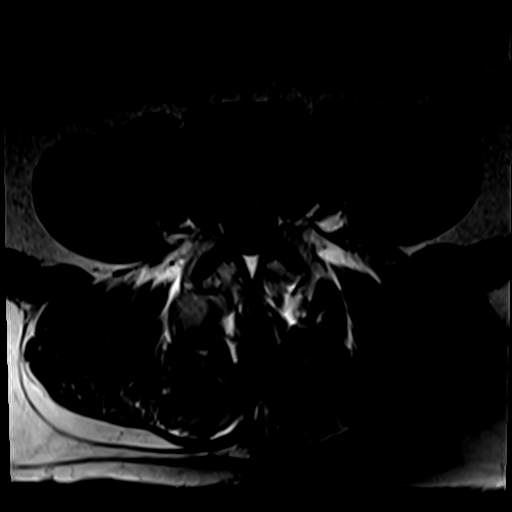

[27 of 48 positions shown; findings below may reference images not displayed]

FINDINGS: Segmentation:  5 lumbar type vertebrae

Alignment:  Stable.  Fused L5-S1 anterolisthesis.

Vertebrae: Interval L5-S1 PLIF. Left eccentric cage with
susceptibility artifact and hypointense signal obscuring the left
foramen. No evident marrow edema or bone lesion.

Conus medullaris and cauda equina: Conus extends to the L1 level.
Conus and cauda equina appear normal.

Paraspinal and other soft tissues: No evidence of perispinal mass or
inflammation.

Disc levels:

T12- L1: Unremarkable.

L1-L2: Disc narrowing and desiccation with ventral spurring.

L2-L3: Mild disc desiccation and narrowing.

L3-L4: Mild disc desiccation and narrowing.

L4-L5: Disc narrowing and bulging with annular fissure. Negative
facets

L5-S1:PLIF. No visible left foraminal patency due to susceptibility
artifact primarily. The canal and right foramina appear widely
patent
IMPRESSION: 1. Interval L5-S1 PLIF. Uncertain status of the left foramen
primarily due to susceptibility artifact, consider CT correlate
given the intervertebral cage is left eccentric.
2. No adjacent herniation or impingement.

## 2023-05-11 NOTE — Progress Notes (Deleted)
[facility]  PATIENT: Jerry Keller DOB: 13-Dec-1960  REFERRING DOCTOR OR PCP:  *** SOURCE: ***  _________________________________   HISTORICAL  CHIEF COMPLAINT:  No chief complaint on file.   HISTORY OF PRESENT ILLNESS:  ***   Imaging: CT myelogram lumbar 01/31/2023 showed: At L3-L4 there is a small rightward disc protrusion that does not cause spinal stenosis or nerve root compression.  At L4-L5, trace retrolisthesis and broad disc protrusion resulting in moderate right greater than left foraminal narrowing but no definite nerve root compression.  5 S1, there is remote fusion/laminectomy.  No stenosis  MRI of the head 01/27/2023 was normal for age  MRI of the cervical 03/26/2021 showed a normal spinal cord: At C4-C5 there is fairly mild disc bulging and no spinal stenosis or nerve root compression.  At C5-C6 there is reduced disc height, disc bulging and uncovertebral spurring causing mild to moderate right greater than left foraminal narrowing.  No spinal stenosis or nerve root compression.  At C6-C7, there is disc bulging, uncovertebral spurring.  Moderate right foraminal narrowing but no spinal stenosis.  MRI of the thoracic spine 03/26/2021 showed a normal spinal cord..  There is a small disc protrusion at T7-T8 but no spinal stenosis or nerve root compression.  MRI of the lumbar spine 07/12/2020 (before surgery) shows mild degenerative changes in the upper to mid lumbar spine.  At L4-L5, there is disc protrusion.  There is no foraminal narrowing.  There is moderate foraminal narrowing but no definite nerve root compression.  At L5-S1, there is 2 mm anterolisthesis, probable bilateral pars defects and disc bulging.  REVIEW OF SYSTEMS: Constitutional: No fevers, chills, sweats, or change in appetite Eyes: No visual changes, double vision, eye pain Ear, nose and throat: No hearing loss, ear pain, nasal congestion, sore throat Cardiovascular: No chest pain,  palpitations Respiratory:  No shortness of breath at rest or with exertion.   No wheezes GastrointestinaI: No nausea, vomiting, diarrhea, abdominal pain, fecal incontinence Genitourinary:  No dysuria, urinary retention or frequency.  No nocturia. Musculoskeletal:  No neck pain, back pain Integumentary: No rash, pruritus, skin lesions Neurological: as above Psychiatric: No depression at this time.  No anxiety Endocrine: No palpitations, diaphoresis, change in appetite, change in weigh or increased thirst Hematologic/Lymphatic:  No anemia, purpura, petechiae. Allergic/Immunologic: No itchy/runny eyes, nasal congestion, recent allergic reactions, rashes  ALLERGIES: Allergies  Allergen Reactions   Latex Rash    HOME MEDICATIONS:  Current Outpatient Medications:    albuterol (VENTOLIN HFA) 108 (90 Base) MCG/ACT inhaler, Inhale into the lungs every 6 (six) hours as needed for wheezing or shortness of breath., Disp: , Rfl:    ALPRAZolam (XANAX) 0.5 MG tablet, Take 0.5 mg by mouth 3 (three) times daily as needed for anxiety., Disp: , Rfl:    aspirin EC 81 MG tablet, Take 81 mg by mouth daily., Disp: , Rfl:    buPROPion (WELLBUTRIN XL) 300 MG 24 hr tablet, Take 300 mg by mouth daily., Disp: , Rfl:    Cholecalciferol (VITAMIN D3) 50 MCG (2000 UT) TABS, Take 2,000 Units by mouth daily after breakfast., Disp: , Rfl:    citalopram (CELEXA) 20 MG tablet, Take 20 mg by mouth daily., Disp: , Rfl:    esomeprazole (NEXIUM) 20 MG capsule, Take 20 mg by mouth daily as needed (for reflux)., Disp: , Rfl:    fluticasone (FLONASE) 50 MCG/ACT nasal spray, Place 1-2 sprays into both nostrils at bedtime as needed (congestion). , Disp: , Rfl:  folic acid (FOLVITE) 400 MCG tablet, Take 400 mcg by mouth daily., Disp: , Rfl:    HYDROcodone-acetaminophen (NORCO) 7.5-325 MG tablet, Take 1 tablet by mouth every 6 (six) hours as needed for moderate pain., Disp: , Rfl:    levothyroxine (SYNTHROID) 112 MCG tablet, Take  100 mcg by mouth daily before breakfast., Disp: , Rfl:    Magnesium 250 MG TABS, Take 250 mg by mouth daily. , Disp: , Rfl:    metoprolol tartrate (LOPRESSOR) 50 MG tablet, Take 50 mg by mouth in the morning., Disp: , Rfl:    PRESCRIPTION MEDICATION, See admin instructions. CPAP- At bedtime and during naps, Disp: , Rfl:    pyridOXINE (VITAMIN B6) 100 MG tablet, Take 100 mg by mouth daily., Disp: , Rfl:    rosuvastatin (CRESTOR) 10 MG tablet, Take 10 mg by mouth daily., Disp: , Rfl:    tamsulosin (FLOMAX) 0.4 MG CAPS capsule, Take 0.4 mg by mouth daily after breakfast., Disp: , Rfl:    traMADol (ULTRAM) 50 MG tablet, 1-2 tabs PO q6 hours prn pain, Disp: 20 tablet, Rfl: 0   vitamin B-12 (CYANOCOBALAMIN) 1000 MCG tablet, Take 1,000 mcg by mouth daily., Disp: , Rfl:   PAST MEDICAL HISTORY: Past Medical History:  Diagnosis Date   Anxiety    Depression    GERD (gastroesophageal reflux disease)    Headache, cluster    Hyperlipidemia    a. Mild - statin started 02/2014.   Hypertension    Hypothyroidism    Non-obstructive CAD    a. 02/2014 Cath: LM nl, LAD 25m/d, LCX nl, RCA 20p/m, EF 55-60%-->Med Rx.   Palpitations    none recently   Sleep apnea    uses cpap    PAST SURGICAL HISTORY: Past Surgical History:  Procedure Laterality Date   CARPAL TUNNEL RELEASE Right 01/13/2023   Procedure: RIGHT CARPAL TUNNEL RELEASE;  Surgeon: Betha Loa, MD;  Location: [facility];  Service: Orthopedics;  Laterality: Right;   COLONOSCOPY N/A 07/30/2013   Procedure: COLONOSCOPY;  Surgeon: Theda Belfast, MD;  Location: [facility];  Service: Endoscopy;  Laterality: N/A;   LEFT HEART CATH AND CORONARY ANGIOGRAPHY N/A 06/23/2019   Procedure: LEFT HEART CATH AND CORONARY ANGIOGRAPHY;  Surgeon: Tonny Bollman, MD;  Location: [facility] INVASIVE CV LAB;  Service: Cardiovascular;  Laterality: N/A;   LEFT HEART CATHETERIZATION WITH CORONARY ANGIOGRAM N/A 03/08/2014   Procedure: LEFT HEART CATHETERIZATION  WITH CORONARY ANGIOGRAM;  Surgeon: Kathleene Hazel, MD;  Location: [facility] CATH LAB;  Service: Cardiovascular;  Laterality: N/A;   TONSILLECTOMY AND ADENOIDECTOMY     as a child   TRIGGER FINGER RELEASE Right 01/13/2023   Procedure: RELEASE TRIGGER FINGER/A-1 PULLEY RIGHT INDEX FINGER;  Surgeon: Betha Loa, MD;  Location: [facility];  Service: Orthopedics;  Laterality: Right;    FAMILY HISTORY: Family History  Problem Relation Age of Onset   Hypertension Mother    Hyperlipidemia Father    Hypertension Father    Prostate cancer Father    Sleep apnea Father    Heart attack Maternal Uncle 71   Heart attack Maternal Grandfather 60    SOCIAL HISTORY: Social History   Socioeconomic History   Marital status: Married    Spouse name: Not on file   Number of children: Not on file   Years of education: Not on file   Highest education level: Not on file  Occupational History   Occupation: maintaince    Employer: [employer]  Tobacco Use  Smoking status: Former    Current packs/day: 0.00    Average packs/day: 1 pack/day for 6.0 years (6.0 ttl pk-yrs)    Types: Cigarettes    Start date: 04/15/1994    Quit date: 04/15/2000    Years since quitting: 23.0   Smokeless tobacco: Never  Vaping Use   Vaping status: Never Used  Substance and Sexual Activity   Alcohol use: Not Currently   Drug use: No   Sexual activity: Yes  Other Topics Concern   Not on file  Social History Narrative   Not on file   Social Drivers of Health   Financial Resource Strain: Not on file  Food Insecurity: Not on file  Transportation Needs: No Transportation Needs (02/06/2023)   Received from [employer]    In the past 12 months, has lack of reliable transportation kept you from medical appointments, meetings, work or from getting things needed for daily living? : No  Physical Activity: Not on file  Stress: Not on file  Social Connections: Not on file  Intimate Partner  Violence: Not on file       PHYSICAL EXAM  There were no vitals filed for this visit.  There is no height or weight on file to calculate BMI.   General: The patient is well-developed and well-nourished and in no acute distress  HEENT:  Head is [place]/AT.  Sclera are anicteric.  Funduscopic exam shows normal optic discs and retinal vessels.  Neck: No carotid bruits are noted.  The neck is nontender.  Cardiovascular: The heart has a regular rate and rhythm with a normal S1 and S2. There were no murmurs, gallops or rubs.    Skin: Extremities are without rash or  edema.  Musculoskeletal:  Back is nontender  Neurologic Exam  Mental status: The patient is alert and oriented x 3 at the time of the examination. The patient has apparent normal recent and remote memory, with an apparently normal attention span and concentration ability.   Speech is normal.  Cranial nerves: Extraocular movements are full. Pupils are equal, round, and reactive to light and accomodation.  Visual fields are full.  Facial symmetry is present. There is good facial sensation to soft touch bilaterally.Facial strength is normal.  Trapezius and sternocleidomastoid strength is normal. No dysarthria is noted.  The tongue is midline, and the patient has symmetric elevation of the soft palate. No obvious hearing deficits are noted.  Motor:  Muscle bulk is normal.   Tone is normal. Strength is  5 / 5 in all 4 extremities.   Sensory: Sensory testing is intact to pinprick, soft touch and vibration sensation in all 4 extremities.  Coordination: Cerebellar testing reveals good finger-nose-finger and heel-to-shin bilaterally.  Gait and station: Station is normal.   Gait is normal. Tandem gait is normal. Romberg is negative.   Reflexes: Deep tendon reflexes are symmetric and normal bilaterally.   Plantar responses are flexor.    DIAGNOSTIC DATA (LABS, IMAGING, TESTING) - I reviewed patient records, labs, notes, testing and  imaging myself where available.  Lab Results  Component Value Date   WBC 6.3 10/21/2021   HGB 15.9 10/21/2021   HCT 48.5 10/21/2021   MCV 97.0 10/21/2021   PLT 254 10/21/2021      Component Value Date/Time   NA 138 10/21/2021 1342   K 4.3 10/21/2021 1342   CL 102 10/21/2021 1342   CO2 27 10/21/2021 1342   GLUCOSE 92 10/21/2021 1342   BUN  15 10/21/2021 1342   CREATININE 1.12 10/21/2021 1342   CALCIUM 10.5 (H) 10/21/2021 1342   PROT 7.8 10/21/2021 1342   ALBUMIN 4.8 10/21/2021 1342   AST 29 10/21/2021 1342   ALT 41 10/21/2021 1342   ALKPHOS 66 10/21/2021 1342   BILITOT 0.7 10/21/2021 1342   GFRNONAA >60 10/21/2021 1342   GFRAA >60 06/23/2019 0440   Lab Results  Component Value Date   CHOL 208 (H) 06/22/2019   HDL 35 (L) 06/22/2019   LDLCALC 129 (H) 06/22/2019   TRIG 218 (H) 06/22/2019   CHOLHDL 5.9 06/22/2019   Lab Results  Component Value Date   HGBA1C 5.7 (H) 06/22/2019   No results found for: "VITAMINB12" Lab Results  Component Value Date   TSH 1.369 06/22/2019       ASSESSMENT AND PLAN  ***   Camela Wich A. Epimenio Foot, MD, [facility] 05/11/2023, 4:22 PM Certified in Neurology, Clinical Neurophysiology, Sleep Medicine and Neuroimaging  [facility] [address], [address] [place], [place] [address] [phone]

## 2023-05-14 ENCOUNTER — Ambulatory Visit: Payer: BC Managed Care – PPO | Admitting: Neurology

## 2023-05-23 ENCOUNTER — Encounter (HOSPITAL_BASED_OUTPATIENT_CLINIC_OR_DEPARTMENT_OTHER): Payer: Self-pay | Admitting: Orthopedic Surgery

## 2023-05-23 ENCOUNTER — Encounter (HOSPITAL_BASED_OUTPATIENT_CLINIC_OR_DEPARTMENT_OTHER): Payer: Self-pay

## 2023-05-23 ENCOUNTER — Other Ambulatory Visit: Payer: Self-pay | Admitting: Orthopedic Surgery

## 2023-05-23 ENCOUNTER — Other Ambulatory Visit: Payer: Self-pay

## 2023-05-30 ENCOUNTER — Encounter (HOSPITAL_BASED_OUTPATIENT_CLINIC_OR_DEPARTMENT_OTHER)
Admission: RE | Disposition: A | Discharge status: Home / Self Care | Payer: Self-pay | Source: Ambulatory Visit | Attending: Orthopedic Surgery

## 2023-05-30 ENCOUNTER — Encounter (HOSPITAL_BASED_OUTPATIENT_CLINIC_OR_DEPARTMENT_OTHER): Payer: Self-pay | Admitting: Orthopedic Surgery

## 2023-05-30 ENCOUNTER — Other Ambulatory Visit: Payer: Self-pay

## 2023-05-30 ENCOUNTER — Ambulatory Visit (HOSPITAL_BASED_OUTPATIENT_CLINIC_OR_DEPARTMENT_OTHER)
Admission: RE | Admit: 2023-05-30 | Discharge: 2023-05-30 | Disposition: A | Discharge status: Home / Self Care | Payer: BC Managed Care – PPO | Source: Ambulatory Visit | Attending: Orthopedic Surgery | Admitting: Orthopedic Surgery

## 2023-05-30 ENCOUNTER — Ambulatory Visit (HOSPITAL_BASED_OUTPATIENT_CLINIC_OR_DEPARTMENT_OTHER): Payer: BC Managed Care – PPO | Admitting: Anesthesiology

## 2023-05-30 ENCOUNTER — Ambulatory Visit (HOSPITAL_BASED_OUTPATIENT_CLINIC_OR_DEPARTMENT_OTHER): Payer: Self-pay | Admitting: Anesthesiology

## 2023-05-30 DIAGNOSIS — Z01818 Encounter for other preprocedural examination: Secondary | ICD-10-CM

## 2023-05-30 DIAGNOSIS — S46011A Strain of muscle(s) and tendon(s) of the rotator cuff of right shoulder, initial encounter: Secondary | ICD-10-CM | POA: Diagnosis present

## 2023-05-30 DIAGNOSIS — W000XXA Fall on same level due to ice and snow, initial encounter: Secondary | ICD-10-CM | POA: Insufficient documentation

## 2023-05-30 DIAGNOSIS — Z87891 Personal history of nicotine dependence: Secondary | ICD-10-CM | POA: Insufficient documentation

## 2023-05-30 DIAGNOSIS — G4730 Sleep apnea, unspecified: Secondary | ICD-10-CM | POA: Diagnosis not present

## 2023-05-30 DIAGNOSIS — I10 Essential (primary) hypertension: Secondary | ICD-10-CM | POA: Diagnosis not present

## 2023-05-30 DIAGNOSIS — K219 Gastro-esophageal reflux disease without esophagitis: Secondary | ICD-10-CM | POA: Diagnosis not present

## 2023-05-30 DIAGNOSIS — Z8249 Family history of ischemic heart disease and other diseases of the circulatory system: Secondary | ICD-10-CM | POA: Diagnosis not present

## 2023-05-30 DIAGNOSIS — I2510 Atherosclerotic heart disease of native coronary artery without angina pectoris: Secondary | ICD-10-CM | POA: Diagnosis not present

## 2023-05-30 HISTORY — PX: SHOULDER ARTHROSCOPY WITH ROTATOR CUFF REPAIR: SHX5685

## 2023-05-30 SURGERY — ARTHROSCOPY, SHOULDER, WITH ROTATOR CUFF REPAIR
Anesthesia: General | Laterality: Right

## 2023-05-30 MED ORDER — PROPOFOL 10 MG/ML IV BOLUS
INTRAVENOUS | Status: AC
  Filled 2023-05-30: qty 20

## 2023-05-30 MED ORDER — SODIUM CHLORIDE 0.9 % IV SOLN
12.5000 mg | INTRAVENOUS | Status: DC | PRN
  Filled 2023-05-30: qty 0.5

## 2023-05-30 MED ORDER — EPHEDRINE SULFATE (PRESSORS) 50 MG/ML IJ SOLN
INTRAMUSCULAR | Status: DC | PRN
  Administered 2023-05-30: 10 mg via INTRAVENOUS
  Administered 2023-05-30: 5 mg via INTRAVENOUS
  Administered 2023-05-30 (×3): 10 mg via INTRAVENOUS

## 2023-05-30 MED ORDER — FENTANYL CITRATE (PF) 100 MCG/2ML IJ SOLN
INTRAMUSCULAR | Status: AC
  Filled 2023-05-30: qty 2

## 2023-05-30 MED ORDER — HYDROMORPHONE HCL 1 MG/ML IJ SOLN
INTRAMUSCULAR | Status: AC
  Filled 2023-05-30: qty 0.5

## 2023-05-30 MED ORDER — DEXAMETHASONE SODIUM PHOSPHATE 10 MG/ML IJ SOLN
INTRAMUSCULAR | Status: DC | PRN
  Administered 2023-05-30: 10 mg via INTRAVENOUS

## 2023-05-30 MED ORDER — FENTANYL CITRATE (PF) 100 MCG/2ML IJ SOLN
100.0000 ug | Freq: Once | INTRAMUSCULAR | Status: AC
  Administered 2023-05-30: 100 ug via INTRAVENOUS

## 2023-05-30 MED ORDER — HYDROMORPHONE HCL 1 MG/ML IJ SOLN
0.2500 mg | INTRAMUSCULAR | Status: DC | PRN
  Administered 2023-05-30 (×2): 0.5 mg via INTRAVENOUS

## 2023-05-30 MED ORDER — SUGAMMADEX SODIUM 200 MG/2ML IV SOLN
INTRAVENOUS | Status: DC | PRN
  Administered 2023-05-30: 200 mg via INTRAVENOUS
  Administered 2023-05-30: 300 mg via INTRAVENOUS

## 2023-05-30 MED ORDER — FENTANYL CITRATE (PF) 250 MCG/5ML IJ SOLN
INTRAMUSCULAR | Status: DC | PRN
  Administered 2023-05-30 (×2): 50 ug via INTRAVENOUS

## 2023-05-30 MED ORDER — CEFAZOLIN SODIUM-DEXTROSE 2-4 GM/100ML-% IV SOLN
INTRAVENOUS | Status: AC
  Filled 2023-05-30: qty 100

## 2023-05-30 MED ORDER — MIDAZOLAM HCL 2 MG/2ML IJ SOLN
INTRAMUSCULAR | Status: AC
  Filled 2023-05-30: qty 2

## 2023-05-30 MED ORDER — MEPERIDINE HCL 25 MG/ML IJ SOLN: 6.2500 mg | INTRAMUSCULAR | Status: DC | PRN

## 2023-05-30 MED ORDER — FENTANYL CITRATE (PF) 100 MCG/2ML IJ SOLN
INTRAMUSCULAR | Status: AC
Start: 2023-05-30 — End: ?
  Filled 2023-05-30: qty 2

## 2023-05-30 MED ORDER — ONDANSETRON HCL 4 MG/2ML IJ SOLN
INTRAMUSCULAR | Status: DC | PRN
  Administered 2023-05-30: 4 mg via INTRAVENOUS

## 2023-05-30 MED ORDER — MIDAZOLAM HCL 2 MG/2ML IJ SOLN
2.0000 mg | Freq: Once | INTRAMUSCULAR | Status: AC
  Administered 2023-05-30: 2 mg via INTRAVENOUS

## 2023-05-30 MED ORDER — BUPIVACAINE HCL (PF) 0.5 % IJ SOLN
INTRAMUSCULAR | Status: DC | PRN
Start: 2023-05-30 — End: 2023-05-30
  Administered 2023-05-30: 20 mL via PERINEURAL

## 2023-05-30 MED ORDER — BUPIVACAINE LIPOSOME 1.3 % IJ SUSP
INTRAMUSCULAR | Status: DC | PRN
  Administered 2023-05-30: 10 mL via PERINEURAL

## 2023-05-30 MED ORDER — OXYCODONE HCL 5 MG PO TABS
5.0000 mg | ORAL_TABLET | Freq: Once | ORAL | Status: AC | PRN
  Administered 2023-05-30: 5 mg via ORAL

## 2023-05-30 MED ORDER — ROCURONIUM BROMIDE 10 MG/ML (PF) SYRINGE
PREFILLED_SYRINGE | INTRAVENOUS | Status: DC | PRN
  Administered 2023-05-30: 10 mg via INTRAVENOUS
  Administered 2023-05-30: 80 mg via INTRAVENOUS
  Administered 2023-05-30: 10 mg via INTRAVENOUS

## 2023-05-30 MED ORDER — POVIDONE-IODINE 10 % EX SWAB
2.0000 | Freq: Once | CUTANEOUS | Status: AC
  Administered 2023-05-30: 2 via TOPICAL

## 2023-05-30 MED ORDER — LIDOCAINE 2% (20 MG/ML) 5 ML SYRINGE
INTRAMUSCULAR | Status: DC | PRN
  Administered 2023-05-30: 40 mg via INTRAVENOUS

## 2023-05-30 MED ORDER — PHENYLEPHRINE HCL-NACL 20-0.9 MG/250ML-% IV SOLN
INTRAVENOUS | Status: DC | PRN
  Administered 2023-05-30: 20 ug/min via INTRAVENOUS

## 2023-05-30 MED ORDER — LACTATED RINGERS IV SOLN: INTRAVENOUS | Status: DC

## 2023-05-30 MED ORDER — PROPOFOL 10 MG/ML IV BOLUS
INTRAVENOUS | Status: DC | PRN
  Administered 2023-05-30: 150 mg via INTRAVENOUS

## 2023-05-30 MED ORDER — PHENYLEPHRINE HCL-NACL 20-0.9 MG/250ML-% IV SOLN
INTRAVENOUS | Status: AC
Start: 2023-05-30 — End: ?
  Filled 2023-05-30: qty 250

## 2023-05-30 MED ORDER — OXYCODONE HCL 5 MG/5ML PO SOLN: 5.0000 mg | Freq: Once | ORAL | Status: AC | PRN

## 2023-05-30 MED ORDER — SODIUM CHLORIDE 0.9 % IR SOLN
Status: DC | PRN
  Administered 2023-05-30: 18000 mL

## 2023-05-30 MED ORDER — ROCURONIUM BROMIDE 10 MG/ML (PF) SYRINGE
PREFILLED_SYRINGE | INTRAVENOUS | Status: AC
  Filled 2023-05-30: qty 10

## 2023-05-30 MED ORDER — CEFAZOLIN SODIUM-DEXTROSE 2-4 GM/100ML-% IV SOLN
2.0000 g | INTRAVENOUS | Status: AC
  Administered 2023-05-30: 2 g via INTRAVENOUS

## 2023-05-30 MED ORDER — CEFAZOLIN SODIUM-DEXTROSE 2-4 GM/100ML-% IV SOLN
INTRAVENOUS | Status: AC
Start: 2023-05-30 — End: ?
  Filled 2023-05-30: qty 100

## 2023-05-30 MED ORDER — AMISULPRIDE (ANTIEMETIC) 5 MG/2ML IV SOLN: 10.0000 mg | Freq: Once | INTRAVENOUS | Status: DC | PRN

## 2023-05-30 MED ORDER — OXYCODONE HCL 5 MG PO TABS
ORAL_TABLET | ORAL | Status: AC
  Filled 2023-05-30: qty 1

## 2023-05-30 MED ORDER — OXYCODONE HCL 5 MG PO TABS: 5.0000 mg | ORAL_TABLET | ORAL | 0 refills | Status: AC | PRN

## 2023-05-30 SURGICAL SUPPLY — 58 items
ANCHOR FIBERTAK 2.6X1.7 BLUE (Anchor) IMPLANT
ANCHOR SUT FBRTK 2.6 SP1.7 TAP (Anchor) IMPLANT
ANCHOR SWIVELOCK SP KL 4.75 (Anchor) IMPLANT
BLADE EXCALIBUR 4.0X13 (MISCELLANEOUS) IMPLANT
BURR OVAL 8 FLU 5.0X13 (MISCELLANEOUS) IMPLANT
CANNULA 5.75X71 LONG (CANNULA) IMPLANT
CANNULA PASSPORT 5 (CANNULA) IMPLANT
CANNULA PASSPORT BUTTON 10-40 (CANNULA) IMPLANT
CANNULA TWIST IN 8.25X7CM (CANNULA) IMPLANT
CHLORAPREP W/TINT 26 (MISCELLANEOUS) ×4 IMPLANT
DISSECTOR 3.8MM X 13CM (MISCELLANEOUS) ×2 IMPLANT
DISSECTOR 4.0MM X 13CM (MISCELLANEOUS) IMPLANT
DRAPE IMP U-DRAPE 54X76 (DRAPES) ×2 IMPLANT
DRAPE INCISE IOBAN 66X45 STRL (DRAPES) IMPLANT
DRAPE SHOULDER BEACH CHAIR (DRAPES) IMPLANT
DRAPE SURG 17X23 STRL (DRAPES) ×2 IMPLANT
DRAPE U-SHAPE 47X51 STRL (DRAPES) ×2 IMPLANT
DRAPE U-SHAPE 76X120 STRL (DRAPES) ×4 IMPLANT
DRSG EMULSION OIL 3X3 NADH (GAUZE/BANDAGES/DRESSINGS) IMPLANT
DW OUTFLOW CASSETTE/TUBE SET (MISCELLANEOUS) IMPLANT
GAUZE PAD ABD 8X10 STRL (GAUZE/BANDAGES/DRESSINGS) ×2 IMPLANT
GAUZE SPONGE 4X4 12PLY STRL (GAUZE/BANDAGES/DRESSINGS) ×4 IMPLANT
GLOVE BIO SURGEON STRL SZ7.5 (GLOVE) ×2 IMPLANT
GLOVE SURG SYN 7.5 E (GLOVE) ×2 IMPLANT
GLOVE SURG SYN 7.5 PF PI (GLOVE) ×4 IMPLANT
GLOVE SURG SYN 8.0 (GLOVE) ×2 IMPLANT
GLOVE SURG SYN 8.0 PF PI (GLOVE) IMPLANT
GOWN STRL REUS W/ TWL LRG LVL3 (GOWN DISPOSABLE) ×4 IMPLANT
GOWN STRL REUS W/ TWL XL LVL3 (GOWN DISPOSABLE) ×2 IMPLANT
GOWN STRL REUS W/TWL XL LVL3 (GOWN DISPOSABLE) ×2 IMPLANT
KIT PUSHLOCK 2.9 HIP (KITS) IMPLANT
MANIFOLD NEPTUNE II (INSTRUMENTS) ×2 IMPLANT
NDL HD SCORPION MEGA LOADER (NEEDLE) IMPLANT
NS IRRIG 1000ML POUR BTL (IV SOLUTION) IMPLANT
PACK ARTHROSCOPY DSU (CUSTOM PROCEDURE TRAY) ×2 IMPLANT
PACK BASIN DAY SURGERY FS (CUSTOM PROCEDURE TRAY) ×2 IMPLANT
PAD ORTHO SHOULDER 7X19 LRG (SOFTGOODS) IMPLANT
RESTRAINT HEAD UNIVERSAL NS (MISCELLANEOUS) ×2 IMPLANT
SHEET MEDIUM DRAPE 40X70 STRL (DRAPES) ×2 IMPLANT
SLEEVE SCD COMPRESS KNEE MED (STOCKING) ×2 IMPLANT
SLING ARM FOAM STRAP LRG (SOFTGOODS) IMPLANT
STRIP CLOSURE SKIN 1/2X4 (GAUZE/BANDAGES/DRESSINGS) IMPLANT
SUT ETHILON 3 0 PS 1 (SUTURE) ×2 IMPLANT
SUT FIBERWIRE #2 38 T-5 BLUE (SUTURE) IMPLANT
SUT MNCRL AB 3-0 PS2 27 (SUTURE) IMPLANT
SUT MNCRL AB 4-0 PS2 18 (SUTURE) IMPLANT
SUT PDS AB 0 CT 36 (SUTURE) IMPLANT
SUT TIGER TAPE 7 IN WHITE (SUTURE) IMPLANT
SUTURE FIBERWR #2 38 T-5 BLUE (SUTURE) IMPLANT
SUTURE TAPE TIGERLINK 1.3MM BL (SUTURE) IMPLANT
SUTURETAPE TIGERLINK 1.3MM BL (SUTURE) IMPLANT
SYR 20ML LL LF (SYRINGE) IMPLANT
TAPE FIBER 2MM 7IN #2 BLUE (SUTURE) IMPLANT
TOWEL GREEN STERILE FF (TOWEL DISPOSABLE) ×2 IMPLANT
TUBE CONNECTING 20X1/4 (TUBING) ×4 IMPLANT
TUBING ARTHROSCOPY IRRIG 16FT (MISCELLANEOUS) ×2 IMPLANT
WAND ABLATOR APOLLO I90 (BUR) ×2 IMPLANT
WATER STERILE IRR 1000ML POUR (IV SOLUTION) ×2 IMPLANT

## 2023-05-30 NOTE — Progress Notes (Signed)
Assisted Dr. Hyacinth Meeker with right, interscalene , ultrasound guided block. Side rails up, monitors on throughout procedure. See vital signs in flow sheet. Tolerated Procedure well.

## 2023-05-30 NOTE — H&P (Signed)
Orthopaedic Surgery Hand and Upper Extremity History and Physical Examination 05/30/2023  Referring Provider: No referring provider defined for this encounter.  CC: Right rotator cuff repair  HPI: Jerry Keller is a 63 y.o. male with right rotator cuff repair here for surgery.   Past Medical History: Past Medical History:  Diagnosis Date   Anxiety    Depression    GERD (gastroesophageal reflux disease)    Headache, cluster    Hyperlipidemia    a. Mild - statin started 02/2014.   Hypertension    Hypothyroidism    Non-obstructive CAD    a. 02/2014 Cath: LM nl, LAD 61m/d, LCX nl, RCA 20p/m, EF 55-60%-->Med Rx.   Palpitations    none recently   Sleep apnea    uses cpap     Medications: Scheduled Meds:  povidone-iodine  2 Application Topical Once   Continuous Infusions:   ceFAZolin (ANCEF) IV     lactated ringers 10 mL/hr at 05/30/23 0857   PRN Meds:.  Allergies: Allergies as of 05/23/2023 - Review Complete 05/23/2023  Allergen Reaction Noted   Latex Rash 07/12/2020    Past Surgical History: Past Surgical History:  Procedure Laterality Date   CARPAL TUNNEL RELEASE Right 01/13/2023   Procedure: RIGHT CARPAL TUNNEL RELEASE;  Surgeon: Betha Loa, MD;  Location: [facility];  Service: Orthopedics;  Laterality: Right;   COLONOSCOPY N/A 07/30/2013   Procedure: COLONOSCOPY;  Surgeon: Theda Belfast, MD;  Location: [facility];  Service: Endoscopy;  Laterality: N/A;   LEFT HEART CATH AND CORONARY ANGIOGRAPHY N/A 06/23/2019   Procedure: LEFT HEART CATH AND CORONARY ANGIOGRAPHY;  Surgeon: Tonny Bollman, MD;  Location: [facility] INVASIVE CV LAB;  Service: Cardiovascular;  Laterality: N/A;   LEFT HEART CATHETERIZATION WITH CORONARY ANGIOGRAM N/A 03/08/2014   Procedure: LEFT HEART CATHETERIZATION WITH CORONARY ANGIOGRAM;  Surgeon: Kathleene Hazel, MD;  Location: [facility] CATH LAB;  Service: Cardiovascular;  Laterality: N/A;   TONSILLECTOMY AND ADENOIDECTOMY     as  a child   TRIGGER FINGER RELEASE Right 01/13/2023   Procedure: RELEASE TRIGGER FINGER/A-1 PULLEY RIGHT INDEX FINGER;  Surgeon: Betha Loa, MD;  Location: [facility];  Service: Orthopedics;  Laterality: Right;     Social History: Social History   Occupational History   Occupation: Health visitor: [employer]  Tobacco Use   Smoking status: Former    Current packs/day: 0.00    Average packs/day: 1 pack/day for 6.0 years (6.0 ttl pk-yrs)    Types: Cigarettes    Start date: 04/15/1994    Quit date: 04/15/2000    Years since quitting: 23.1   Smokeless tobacco: Never  Vaping Use   Vaping status: Never Used  Substance and Sexual Activity   Alcohol use: Not Currently   Drug use: No   Sexual activity: Yes     Family History: Family History  Problem Relation Age of Onset   Hypertension Mother    Hyperlipidemia Father    Hypertension Father    Prostate cancer Father    Sleep apnea Father    Heart attack Maternal Uncle 51   Heart attack Maternal Grandfather 8   Otherwise, no relevant orthopaedic family history  ROS: Review of Systems: All systems reviewed and are negative except that mentioned in HPI  Work/Sport/Hobbies: See HPI  Physical Examination: Vitals:   05/30/23 0853  BP: (!) 128/90  Pulse: 64  Resp: 12  Temp: (!) 97.3 F (36.3 C)  SpO2: 97%   Constitutional: Awake,  alert.  WN/WD Appearance: healthy, no acute distress, well-groomed Affect: Normal HEENT: EOMI, mucous membranes moist CV: RRR Pulm: breathing comfortably   Right Upper Extremity / Hand Skin intact.  No change in ROM, sensation, motor, or vascular assessment of RUE   Pertinent Labs: n/a  Imaging: I have personally reviewed the following studies: N/a  Additional Studies: n/a  Assessment/Plan: Jerry Keller is a 62 y.o. active male with acute right rotator cuff tear after fall   Plan for surgery today.  Reviewed the risks, benefits, and alternatives and he  wished to proceed.  Discussed possible open subscap repair, possible biceps tenotomy vs tenodesis.   Jerry Pollack, MD Hand and Upper Extremity Surgery [facility] [phone] 05/30/2023 9:08 AM

## 2023-05-30 NOTE — Anesthesia Procedure Notes (Signed)
Procedure Name: Intubation Date/Time: 05/30/2023 10:55 AM  Performed by: Roosvelt Harps, CRNAPre-anesthesia Checklist: Patient identified, Emergency Drugs available, Suction available and Patient being monitored Patient Re-evaluated:Patient Re-evaluated prior to induction Oxygen Delivery Method: Circle System Utilized Preoxygenation: Pre-oxygenation with 100% oxygen Induction Type: IV induction Ventilation: Mask ventilation without difficulty Laryngoscope Size: Mac and 4 Grade View: Grade II Tube type: Oral Tube size: 7.5 mm Number of attempts: 2 Airway Equipment and Method: Stylet and Oral airway Placement Confirmation: ETT inserted through vocal cords under direct vision, positive ETCO2 and breath sounds checked- equal and bilateral Secured at: 23 cm Tube secured with: Tape Dental Injury: Injury to lip  Difficulty Due To: Difficult Airway- due to large tongue and Difficulty was unanticipated Comments: Small bottom right lip lac noted

## 2023-05-30 NOTE — Transfer of Care (Signed)
Immediate Anesthesia Transfer of Care Note  Patient: Jerry Keller  Procedure(s) Performed: SHOULDER ARTHROSCOPY WITH ROTATOR CUFF REPAIR (Right)  Patient Location: PACU  Anesthesia Type:General and Regional  Level of Consciousness: drowsy  Airway & Oxygen Therapy: Patient Spontanous Breathing and Patient connected to face mask oxygen  Post-op Assessment: Report given to RN and Post -op Vital signs reviewed and stable  Post vital signs: Reviewed and stable  Last Vitals:  Vitals Value Taken Time  BP 133/73 05/30/23 1355  Temp    Pulse 83 05/30/23 1357  Resp 15 05/30/23 1357  SpO2 95 % 05/30/23 1357  Vitals shown include unfiled device data.  Last Pain:  Vitals:   05/30/23 0853  TempSrc: Temporal  PainSc: 5       Patients Stated Pain Goal: 5 (05/30/23 0853)  Complications:  Encounter Notable Events  Notable Event Outcome Phase Comment  Difficult to intubate - unexpected  Intraprocedure Filed from anesthesia note documentation.

## 2023-05-30 NOTE — Anesthesia Procedure Notes (Signed)
Anesthesia Regional Block: Interscalene brachial plexus block   Pre-Anesthetic Checklist: , timeout performed,  Correct Patient, Correct Site, Correct Laterality,  Correct Procedure, Correct Position, site marked,  Risks and benefits discussed,  Surgical consent,  Pre-op evaluation,  At surgeon's request and post-op pain management  Laterality: Right  Prep: chloraprep       Needles:  Injection technique: Single-shot  Needle Type: Stimiplex     Needle Length: 9cm  Needle Gauge: 21     Additional Needles:   Procedures:,,,, ultrasound used (permanent image in chart),,    Narrative:  Start time: 05/30/2023 9:34 AM End time: 05/30/2023 9:39 AM Injection made incrementally with aspirations every 5 mL.  Performed by: Personally  Anesthesiologist: Lowella Curb, MD

## 2023-05-30 NOTE — Op Note (Signed)
I assisted Surgeons and Role:    * Chiaramonti, Edsel Petrin, MD - Primary    Cindee Salt, MD - Assisting on the Procedure(s): SHOULDER ARTHROSCOPY WITH ROTATOR CUFF REPAIR on 05/30/2023.  I provided assistance on this case as follows: Set up, positioning, placement of portals, placement of the joint manipulation of the arm, debridement of cuff tear, placement of anchors, repair of the cuff, closure of the portals and application of the dressing and sling.  Electronically signed by: Cindee Salt, MD Date: 05/30/2023 Time: 1:50 PM

## 2023-05-30 NOTE — Anesthesia Preprocedure Evaluation (Signed)
Anesthesia Evaluation  Patient identified by MRN, date of birth, ID band Patient awake    Reviewed: Allergy & Precautions, NPO status , Patient's Chart, lab work & pertinent test results  Airway Mallampati: II  TM Distance: >3 FB Neck ROM: Full    Dental no notable dental hx.    Pulmonary sleep apnea and Continuous Positive Airway Pressure Ventilation , former smoker   Pulmonary exam normal        Cardiovascular hypertension, Pt. on medications and Pt. on home beta blockers + CAD   Rhythm:Regular Rate:Normal     Neuro/Psych  Headaches  Anxiety Depression       GI/Hepatic Neg liver ROS,GERD  ,,  Endo/Other  Hypothyroidism    Renal/GU negative Renal ROS  negative genitourinary   Musculoskeletal negative musculoskeletal ROS (+)    Abdominal  (+) + obese  Peds  Hematology negative hematology ROS (+)   Anesthesia Other Findings   Reproductive/Obstetrics                             Anesthesia Physical Anesthesia Plan  ASA: 3  Anesthesia Plan: General   Post-op Pain Management: Regional block* and Minimal or no pain anticipated   Induction: Intravenous  PONV Risk Score and Plan: 2 and Ondansetron, Treatment may vary due to age or medical condition and Midazolam  Airway Management Planned: Oral ETT  Additional Equipment: None  Intra-op Plan:   Post-operative Plan: Extubation in OR  Informed Consent: I have reviewed the patients History and Physical, chart, labs and discussed the procedure including the risks, benefits and alternatives for the proposed anesthesia with the patient or authorized representative who has indicated his/her understanding and acceptance.     Dental advisory given  Plan Discussed with: CRNA  Anesthesia Plan Comments:        Anesthesia Quick Evaluation

## 2023-05-30 NOTE — Op Note (Signed)
NAME: Jerry Keller MEDICAL RECORD NO: [id number] DATE OF BIRTH: 01/24/1961 FACILITY: [facility] LOCATION: [facility] PHYSICIAN: Ramon Dredge MD   OPERATIVE REPORT   DATE OF PROCEDURE: 05/30/23    PREOPERATIVE DIAGNOSIS:  Right shoulder acute traumatic rotator cuff tear   POSTOPERATIVE DIAGNOSIS:  same   PROCEDURE:  Right shoulder arthroscopy with rotator cuff repair and biceps tenotomy   SURGEON: Edsel Petrin. Manuelito Poage, M.D.   ASSISTANT: Cindee Salt, MD   ANESTHESIA:  General with regional   INTRAVENOUS FLUIDS:  Per anesthesia flow sheet.   ESTIMATED BLOOD LOSS:  Minimal.   COMPLICATIONS:  None.   SPECIMENS:  none   TOURNIQUET TIME:   * No tourniquets in log *   DISPOSITION:  Stable to PACU.   INDICATIONS:  63 year old male who sustained an acute traumatic complete right supraspinatus tear and concern for partial tears of the infraspinatus and subscapularis after a fall when getting out of his car and slipping on ice in a parking lot.    OPERATIVE COURSE:   Patient was identified in the preoperative care holding area and the procedure, risks, benefits and alternatives were reviewed and discussed with the patient. Informed consent for surgical procedure was reviewed.   Surgical site was confirmed and marked.  Regional block was performed in the pre operative area.  The patient was brought to the operating room. The patient was transferred to the OR table and general anesthesia was initiated by the Anesthesia team. The patient was then positioned in the beach chair position with bony prominences padded.  The operative extremity was prepped with Chloraprep and draped in typical sterile fashion.  A time out was performed.  The standard posterior portal to the shoulder was made and the arthroscope inserted.    An arthroscopic exam of the shoulder was performed and demonstrated a complete tear of the supraspinatus tendon, a medially subluxed long  head biceps tendon with fraying along the tendon and tearing at its origin from the labrum.  There was significant inflammatory tissue present.  The subscapularis appeared intact without evidence of injury.  A standard anterior portal was made, a cannula placed and the RF ablator introduced.  The biceps tendon was cut with the RF ablator and the inflamed tissue was debrided with the shaver and ablated with the RF ablator.    Next, the arthroscope was transitioned to the subacromial space.  There was significant bursitis present.  This was removed with a combination of shaver and RF ablator.  A lateral portal was made.  Two Arthrex Fiber tack anchors were placed adjacent to the articular surface anterior and posterior to create a medial row.  One of the swedged fibertapes was passed through the cuff anteriorly and the other posteriorly.  The swedges were cut to create 2 strands for each.  Next, an Arthrex Swivelock anchor was placed anterior and lateral to the medial fibertack with one limb from the posterior and one limb from the anterior fibertapes passing through the anchor.  A posterior-lateral Swivelock anchor was placed with the remaining free anterior and posterior limbs passing through the anchor.  The free ends of the anchors were cut with an end cutter.    The arthroscope was then removed.  The portals were closed with monocryl.  Steri-strips were placed over the wounds.  Sterile bandages were placed including 4x4 gauze, ABDs and then covered with Medipore tape.  The patient was then placed in a sling and awoken from anesthesia.  Patient was then taken to the recovery room without issue.  POST-OP PLAN:  The patient will remain in the sling at all times except to come out 3 times a day to fully extend the elbow.  Will start PT after first post op visit in 2 weeks.  Ramon Dredge, MD Electronically signed, 05/30/23

## 2023-05-30 NOTE — Brief Op Note (Signed)
05/30/2023  1:55 PM  PATIENT:  Perlie Mayo  63 y.o. male  PRE-OPERATIVE DIAGNOSIS:  rotator cuff tear Right Shoulder  POST-OPERATIVE DIAGNOSIS:  Rotator Cuff Tear Right Shoulder  PROCEDURE:  Procedure(s) with comments: SHOULDER ARTHROSCOPY WITH ROTATOR CUFF REPAIR (Right) - anesthesia: regional block/ general  SURGEON:  Surgeons and Role:    * Ymani Porcher, Edsel Petrin, MD - Primary    * Cindee Salt, MD - Assisting  PHYSICIAN ASSISTANT:   ASSISTANTS: none   ANESTHESIA:   regional and general  EBL:  15 mL   BLOOD ADMINISTERED:none  DRAINS: none   LOCAL MEDICATIONS USED:  NONE  SPECIMEN:  No Specimen  DISPOSITION OF SPECIMEN:  N/A  COUNTS:  YES  TOURNIQUET:  * No tourniquets in log *  DICTATION: .Note written in EPIC  PLAN OF CARE: Discharge to home after PACU  PATIENT DISPOSITION:  PACU - hemodynamically stable.   Delay start of Pharmacological VTE agent (>24hrs) due to surgical blood loss or risk of bleeding: not applicable

## 2023-05-30 NOTE — Discharge Instructions (Addendum)
[facility] Post-Operative Instructions    General -These instructions are to compliment information given to you by your surgeon. -You may resume your normal diet as tolerated.  -You may resume your normal medications unless specifically instructed to stop taking a certain medication. -If you are not sure about restarting one of your medications after surgery, please contact the office during normal business hours and we will be able to assist you.   Post-Operative Dressings Soft Dressings: If you do NOT have a splint or cast on, remove your surgical bandages in 4 days. At that time, you may start to shower or wash normally, but do not soak the wound in a tub, pool, dishwater, etc and do not scrub the incision. After washing, pat your incision dry and then apply a dry bandaid or gauze to cover.   If you have questions about your dressings, please call the clinic.   Post-operative Wound Care In general:  Any sutures or anything that will not fall off over time on their own, will be removed in clinic by our staff.  -If your surgical wound/incision was closed with non-absorbable sutures or staples, they will be removed at your first post-op visit.   -If your incision was closed with absorbable sutures, they do not need to be removed as they will dissolve on their own. They may be visible or buried underneath the skin.    You may have one of the following over your incision/wound.  They can all get wet and should remain in place until they fall off on their own. -Small stickers called steri-strips.  You do not need to remove them. If the ends start to peel up, you can carefully trim them with scissors. -Skin glue often known by the brand name "Dermabond".  Do not remove, it will fall off over time.  Regardless of any sutures, stickers or glue used to close your wound, do not submerge the wound in any standing water for at least 4 weeks  after surgery.  It is okay to let the shower water run over the wound and gently clean the wound with soapy water then rinse and gently pat dry.   Weightbearing/Activity  Do not use your operative extremity for any lifting or weight bearing  Please start finger, hand and wrist range of motion exercises right away.  You may come out of the sling to allow your elbow to fully straighten out 3x times per day.   Pain Control - After your surgery, post-surgical discomfort or pain is normal. This discomfort can last several days to a few weeks. At certain times of the day (usually evenings/nights) your discomfort may be more intense.  - Do not drive while taking narcotic pain medications.   General Anesthesia or Bier Block: If you did not receive a nerve block during your surgery, you will need to start taking your pain medication shortly after your surgery and should continue to do so as prescribed by your surgeon.  Nerve Block:  If you received a nerve block, it may provide pain relief for one hour to up to two days after your surgery. As long as the nerve block is working, you will experience little or no sensation in the area the surgeon operated on.  As the nerve block wears off, you will begin to experience pain or discomfort. It is very important that you begin taking your prescribed pain medication before the nerve block fully wears off.  Treating your pain at the first sign of the block wearing off will ensure your pain is better controlled and more tolerable when full-sensation returns. Do not wait until the pain is intolerable, as the medicine will be less effective. It is better to treat pain in advance than to try and catch up.  If the nerve block made your entire arm numb, you will be given an arm sling that you should wear until the block wears off, but no longer than 2 days unless otherwise instructed.   Pain Medication:  Typically, post operative pain can be managed by alternating  tylenol and an anti inflammatory medication.   We may also prescribe an opioid pain medication such as Oxycodone, Percocet (oxycodone with Tylenol) or Norco (hydrocodone with Tylenol) for post-operative pain. Some of these medications contain Tylenol (acetaminophen) in them.  It takes between 30 and 45 minutes before pain medication starts to work. It is important to take your medication before your pain level gets too intense.   Nausea is a common side effect of many pain medications. You will want to eat something before taking your pain medicine to help prevent nausea.   If you are taking a prescription opioid pain medication that contains acetaminophen (Tylenol), we recommend that you do not take additional over the counter acetaminophen (Tylenol).   If you are prescribed oxycodone WITHOUT acetaminophen (Tylenol) in it and you do not have a known allergy, we recommend you take over-the-counter acetaminophen (Tylenol) with the oxycodone.  Take over-the-counter stool softener such as Colace or Sennakot while taking narcotic pain medications to help prevent constipation.    Other pain relieving options:  Elevation: Elevating your operative extremity can be very helpful to reduce this pain. Prop your arm up on pillows to keep the operative site above the level of your heart. If you develop tingling from prolonged elevation, take a break from elevating and this should resolve.  Icing: If you do not have a splint/cast, using a cold pack to ice the affected area a few times a day (15 to 20 minutes at a time) can also help to relieve pain, reduce swelling and bruising.   If you can take nonsteroidal anti-inflammatory medications (NSAIDs, for example: Ibuprofen, Advil, Motrin, Aleve, etc.), you may take them to help control your pain. If you are unsure whether you can take anti-inflammatory medications, please check with your primary care provider.  If you are already taking a prescription of  anti-inflammatory medications such as Celebrex (celecoxib) or Mobic (meloxicam) you should not take any additional anti-inflammatory drugs such as Ibuprofen, Advil, Motrin, or Aleve.    Follow Up Please call [facility] at [phone] if you do not receive or are unsure of your first follow-up appointment.  You should see your surgeon or PA 10-16 days after your surgery unless otherwise instructed.    Please call the office for any problems, including the following:  - Excessive redness of the incisions - Drainage for more than 4 days - Fever of more than 101.5 F - Nausea/vomiting that does not stop  - Numbness, tingling, or discoloration of extremity  - Unable to drink fluids  - Uncontrollable pain     Shaune Pollack, MD Hand and Upper Extremity Surgery [facility] [phone]   Post Anesthesia Home Care Instructions  Activity: Get plenty of rest for the remainder of the day. A responsible individual must stay with you for 24 hours following the procedure.  For  the next 24 hours, DO NOT: -Drive a car -Advertising copywriter -Drink alcoholic beverages -Take any medication unless instructed by your physician -Make any legal decisions or sign important papers.  Meals: Start with liquid foods such as gelatin or soup. Progress to regular foods as tolerated. Avoid greasy, spicy, heavy foods. If nausea and/or vomiting occur, drink only clear liquids until the nausea and/or vomiting subsides. Call your physician if vomiting continues.  Special Instructions/Symptoms: Your throat may feel dry or sore from the anesthesia or the breathing tube placed in your throat during surgery. If this causes discomfort, gargle with warm salt water. The discomfort should disappear within 24 hours.  If you had a scopolamine patch placed behind your ear for the management of post- operative nausea and/or vomiting:  1. The medication in the patch is effective  for 72 hours, after which it should be removed.  Wrap patch in a tissue and discard in the trash. Wash hands thoroughly with soap and water. 2. You may remove the patch earlier than 72 hours if you experience unpleasant side effects which may include dry mouth, dizziness or visual disturbances. 3. Avoid touching the patch. Wash your hands with soap and water after contact with the patch.    Regional Anesthesia Blocks  1. You may not be able to move or feel the "blocked" extremity after a regional anesthetic block. This may last may last from 3-48 hours after placement, but it will go away. The length of time depends on the medication injected and your individual response to the medication. As the nerves start to wake up, you may experience tingling as the movement and feeling returns to your extremity. If the numbness and inability to move your extremity has not gone away after 48 hours, please call your surgeon.   2. The extremity that is blocked will need to be protected until the numbness is gone and the strength has returned. Because you cannot feel it, you will need to take extra care to avoid injury. Because it may be weak, you may have difficulty moving it or using it. You may not know what position it is in without looking at it while the block is in effect.  3. For blocks in the legs and feet, returning to weight bearing and walking needs to be done carefully. You will need to wait until the numbness is entirely gone and the strength has returned. You should be able to move your leg and foot normally before you try and bear weight or walk. You will need someone to be with you when you first try to ensure you do not fall and possibly risk injury.  4. Bruising and tenderness at the needle site are common side effects and will resolve in a few days.  5. Persistent numbness or new problems with movement should be communicated to the surgeon or the [facility] [phone] [facility] [phone]). Information for Discharge Teaching: EXPAREL (bupivacaine liposome injectable suspension)   Pain relief is important to your recovery. The goal is to control your pain so you can move easier and return to your normal activities as soon as possible after your procedure. Your physician may use several types of medicines to manage pain, swelling, and more.  Your surgeon or anesthesiologist gave you EXPAREL(bupivacaine) to help control your pain after surgery.  EXPAREL is a local anesthetic designed to release slowly over an extended period of time to provide pain relief by numbing the tissue around  the surgical site. EXPAREL is designed to release pain medication over time and can control pain for up to 72 hours. Depending on how you respond to EXPAREL, you may require less pain medication during your recovery. EXPAREL can help reduce or eliminate the need for opioids during the first few days after surgery when pain relief is needed the most. EXPAREL is not an opioid and is not addictive. It does not cause sleepiness or sedation.   Important! A teal colored band has been placed on your arm with the date, time and amount of EXPAREL you have received. Please leave this armband in place for the full 96 hours following administration, and then you may remove the band. If you return to the hospital for any reason within 96 hours following the administration of EXPAREL, the armband provides important information that your health care providers to know, and alerts them that you have received this anesthetic.    Possible side effects of EXPAREL: Temporary loss of sensation or ability to move in the area where medication was injected. Nausea, vomiting, constipation Rarely, numbness and tingling in your mouth or lips, lightheadedness, or anxiety may occur. Call your doctor right away if you think you may be experiencing any of these sensations, or if you have other questions  regarding possible side effects.  Follow all other discharge instructions given to you by your surgeon or nurse. Eat a healthy diet and drink plenty of water or other fluids.

## 2023-05-31 NOTE — Anesthesia Postprocedure Evaluation (Signed)
Anesthesia Post Note  Patient: Jerry Keller  Procedure(s) Performed: SHOULDER ARTHROSCOPY WITH ROTATOR CUFF REPAIR (Right)     Patient location during evaluation: PACU Anesthesia Type: General Level of consciousness: awake and alert Pain management: pain level controlled Vital Signs Assessment: post-procedure vital signs reviewed and stable Respiratory status: spontaneous breathing, nonlabored ventilation and respiratory function stable Cardiovascular status: blood pressure returned to baseline and stable Postop Assessment: no apparent nausea or vomiting Anesthetic complications: yes   Encounter Notable Events  Notable Event Outcome Phase Comment  Difficult to intubate - unexpected  Intraprocedure Filed from anesthesia note documentation.    Last Vitals:  Vitals:   05/30/23 1445 05/30/23 1524  BP: 135/83 131/84  Pulse: 90 86  Resp: 20 18  Temp:  (!) 36.3 C  SpO2: 90% 91%    Last Pain:  Vitals:   05/30/23 1524  TempSrc: Temporal  PainSc:                  Collene Schlichter

## 2023-06-02 ENCOUNTER — Encounter (HOSPITAL_BASED_OUTPATIENT_CLINIC_OR_DEPARTMENT_OTHER): Payer: Self-pay | Admitting: Orthopedic Surgery

## 2023-07-23 ENCOUNTER — Other Ambulatory Visit: Payer: Self-pay | Admitting: Gastroenterology

## 2023-08-22 ENCOUNTER — Encounter (HOSPITAL_COMMUNITY): Payer: Self-pay

## 2023-08-22 ENCOUNTER — Encounter (HOSPITAL_COMMUNITY): Payer: Self-pay | Admitting: Gastroenterology

## 2023-08-22 NOTE — Progress Notes (Signed)
 Attempted to obtain medical history for pre op call via telephone, unable to reach at this time. HIPAA compliant voicemail message left requesting return call to pre surgical testing department.

## 2023-08-29 ENCOUNTER — Other Ambulatory Visit: Payer: Self-pay

## 2023-08-29 ENCOUNTER — Ambulatory Visit (HOSPITAL_COMMUNITY)
Admission: RE | Admit: 2023-08-29 | Discharge: 2023-08-29 | Disposition: A | Discharge status: Home / Self Care | Attending: Gastroenterology | Admitting: Gastroenterology

## 2023-08-29 ENCOUNTER — Encounter (HOSPITAL_COMMUNITY)
Admission: RE | Disposition: A | Discharge status: Home / Self Care | Payer: Self-pay | Source: Home / Self Care | Attending: Gastroenterology

## 2023-08-29 ENCOUNTER — Ambulatory Visit (HOSPITAL_COMMUNITY): Payer: Self-pay | Admitting: Anesthesiology

## 2023-08-29 ENCOUNTER — Encounter (HOSPITAL_COMMUNITY): Payer: Self-pay | Admitting: Gastroenterology

## 2023-08-29 DIAGNOSIS — I2510 Atherosclerotic heart disease of native coronary artery without angina pectoris: Secondary | ICD-10-CM | POA: Insufficient documentation

## 2023-08-29 DIAGNOSIS — Z87891 Personal history of nicotine dependence: Secondary | ICD-10-CM | POA: Diagnosis not present

## 2023-08-29 DIAGNOSIS — G4730 Sleep apnea, unspecified: Secondary | ICD-10-CM | POA: Insufficient documentation

## 2023-08-29 DIAGNOSIS — I10 Essential (primary) hypertension: Secondary | ICD-10-CM | POA: Diagnosis not present

## 2023-08-29 DIAGNOSIS — E039 Hypothyroidism, unspecified: Secondary | ICD-10-CM | POA: Insufficient documentation

## 2023-08-29 DIAGNOSIS — Z1211 Encounter for screening for malignant neoplasm of colon: Secondary | ICD-10-CM | POA: Insufficient documentation

## 2023-08-29 DIAGNOSIS — Z79899 Other long term (current) drug therapy: Secondary | ICD-10-CM | POA: Insufficient documentation

## 2023-08-29 DIAGNOSIS — K219 Gastro-esophageal reflux disease without esophagitis: Secondary | ICD-10-CM | POA: Diagnosis not present

## 2023-08-29 HISTORY — PX: COLONOSCOPY: SHX5424

## 2023-08-29 SURGERY — COLONOSCOPY
Anesthesia: Monitor Anesthesia Care

## 2023-08-29 MED ORDER — SODIUM CHLORIDE 0.9 % IV SOLN: INTRAVENOUS | Status: DC | PRN

## 2023-08-29 MED ORDER — PROPOFOL 10 MG/ML IV BOLUS
INTRAVENOUS | Status: AC
  Filled 2023-08-29: qty 20

## 2023-08-29 MED ORDER — PROPOFOL 500 MG/50ML IV EMUL
INTRAVENOUS | Status: AC
  Filled 2023-08-29: qty 50

## 2023-08-29 MED ORDER — PROPOFOL 1000 MG/100ML IV EMUL
INTRAVENOUS | Status: AC
  Filled 2023-08-29: qty 100

## 2023-08-29 MED ORDER — PROPOFOL 500 MG/50ML IV EMUL
INTRAVENOUS | Status: DC | PRN
  Administered 2023-08-29: 150 ug/kg/min via INTRAVENOUS

## 2023-08-29 MED ORDER — PROPOFOL 10 MG/ML IV BOLUS
INTRAVENOUS | Status: DC | PRN
Start: 2023-08-29 — End: 2023-08-29
  Administered 2023-08-29: 30 mg via INTRAVENOUS

## 2023-08-29 NOTE — H&P (Signed)
 Jerry Keller HPI: At this time the patient denies any problems with nausea, vomiting, fevers, chills, abdominal pain, diarrhea, constipation, hematochezia, melena, GERD, or dysphagia. The patient does not know of any family history of colon cancer. No complaints of chest pain, SOB, or MI.  The colonoscopy on 08/29/2013 was normal.  He has severe sleep apnea.   Past Medical History:  Diagnosis Date   Anxiety    Depression    GERD (gastroesophageal reflux disease)    Headache, cluster    Hyperlipidemia    a. Mild - statin started 02/2014.   Hypertension    Hypothyroidism    Non-obstructive CAD    a. 02/2014 Cath: LM nl, LAD 81m/d, LCX nl, RCA 20p/m, EF 55-60%-->Med Rx.   Palpitations    none recently   Sleep apnea    uses cpap    Past Surgical History:  Procedure Laterality Date   CARPAL TUNNEL RELEASE Right 01/13/2023   Procedure: RIGHT CARPAL TUNNEL RELEASE;  Surgeon: Brunilda Capra, MD;  Location: [facility];  Service: Orthopedics;  Laterality: Right;   COLONOSCOPY N/A 07/30/2013   Procedure: COLONOSCOPY;  Surgeon: Almeda Aris, MD;  Location: [facility];  Service: Endoscopy;  Laterality: N/A;   LEFT HEART CATH AND CORONARY ANGIOGRAPHY N/A 06/23/2019   Procedure: LEFT HEART CATH AND CORONARY ANGIOGRAPHY;  Surgeon: Arnoldo Lapping, MD;  Location: [facility]AS[facility] CV [facility];  Service: Cardiovascular;  Laterality: N/A;   LEFT HEART CATHETERIZATION WITH CORONARY ANGIOGRAM N/A 03/08/2014   Procedure: LEFT HEART CATHETERIZATION WITH CORONARY ANGIOGRAM;  Surgeon: Odie Benne, MD;  Location: [facility] CATH LAB;  Service: Cardiovascular;  Laterality: N/A;   SHOULDER ARTHROSCOPY WITH ROTATOR CUFF REPAIR Right 05/30/2023   Procedure: SHOULDER ARTHROSCOPY WITH ROTATOR CUFF REPAIR;  Surgeon: Gillermo Lack, MD;  Location: [facility];  Service: Orthopedics;  Laterality: Right;  anesthesia: regional block/ general   TONSILLECTOMY AND ADENOIDECTOMY     as a child    TRIGGER FINGER RELEASE Right 01/13/2023   Procedure: RELEASE TRIGGER FINGER/A-1 PULLEY RIGHT INDEX FINGER;  Surgeon: Brunilda Capra, MD;  Location: [facility];  Service: Orthopedics;  Laterality: Right;    Family History  Problem Relation Age of Onset   Hypertension Mother    Hyperlipidemia Father    Hypertension Father    Prostate cancer Father    Sleep apnea Father    Heart attack Maternal Uncle 47   Heart attack Maternal Grandfather 75    Social History:  reports that he quit smoking about 23 years ago. His smoking use included cigarettes. He started smoking about 29 years ago. He has a 6 pack-year smoking history. He has never used smokeless tobacco. He reports that he does not currently use alcohol. He reports that he does not use drugs.  Allergies:  Allergies  Allergen Reactions   Latex Rash    Medications: Scheduled: Continuous:  No results found for this or any previous visit (from the past 24 hours).   No results found.  ROS:  As stated above in the HPI otherwise negative.  Blood pressure (!) 144/77, pulse 74, resp. rate 16, SpO2 97%.    PE: Gen: NAD, Alert and Oriented HEENT:  [place]/AT, EOMI Neck: Supple, no LAD Lungs: CTA Bilaterally CV: RRR without M/G/R ABD: Soft, NTND, +BS Ext: No C/C/E  Assessment/Plan: 1) Screening colonoscopy.  Jerry Keller D 08/29/2023, 10:36 AM

## 2023-08-29 NOTE — Anesthesia Preprocedure Evaluation (Addendum)
 Anesthesia Evaluation  Patient identified by MRN, date of birth, ID band Patient awake    Reviewed: Allergy & Precautions, NPO status , Patient's Chart, lab work & pertinent test results, reviewed documented beta blocker date and time   History of Anesthesia Complications Negative for: history of anesthetic complications  Airway Mallampati: IV  TM Distance: >3 FB Neck ROM: Full    Dental  (+) Dental Advisory Given   Pulmonary sleep apnea and Continuous Positive Airway Pressure Ventilation , former smoker   breath sounds clear to auscultation       Cardiovascular hypertension, Pt. on home beta blockers and Pt. on medications (-) angina + CAD (non-obstructive by cath '21)   Rhythm:Regular Rate:Normal  '21 ECHO: EF 60 to 65%.  1. The LV has normal function, no regional wall motion abnormalities. There is mild LVH. Left ventricular diastolic parameters  were normal.   2. RVF is normal. The right ventricular size is normal.   3. The mitral valve is grossly normal. No evidence of mitral valve regurgitation.   4. The aortic valve is tricuspid. Aortic valve regurgitation is not visualized.     Neuro/Psych  Headaches  Anxiety Depression       GI/Hepatic Neg liver ROS,GERD  Medicated and Controlled,,  Endo/Other  Hypothyroidism    Renal/GU negative Renal ROS     Musculoskeletal   Abdominal   Peds  Hematology negative hematology ROS (+)   Anesthesia Other Findings   Reproductive/Obstetrics                             Anesthesia Physical Anesthesia Plan  ASA: 3  Anesthesia Plan: MAC   Post-op Pain Management: Minimal or no pain anticipated   Induction:   PONV Risk Score and Plan: 2 and Treatment may vary due to age or medical condition  Airway Management Planned: Natural Airway and Simple Face Mask  Additional Equipment: None  Intra-op Plan:   Post-operative Plan:   Informed Consent:  I have reviewed the patients History and Physical, chart, labs and discussed the procedure including the risks, benefits and alternatives for the proposed anesthesia with the patient or authorized representative who has indicated his/her understanding and acceptance.     Dental advisory given  Plan Discussed with: CRNA and Surgeon  Anesthesia Plan Comments:         Anesthesia Quick Evaluation

## 2023-08-29 NOTE — Discharge Instructions (Signed)

## 2023-08-29 NOTE — Anesthesia Procedure Notes (Signed)
 Procedure Name: MAC Date/Time: 08/29/2023 10:49 AM  Performed by: Ulanda Gambles, CRNAPre-anesthesia Checklist: Patient identified, Emergency Drugs available, Suction available and Patient being monitored Oxygen Delivery Method: Simple face mask

## 2023-08-29 NOTE — Anesthesia Postprocedure Evaluation (Signed)
 Anesthesia Post Note  Patient: Jerry Keller  Procedure(s) Performed: COLONOSCOPY     Patient location during evaluation: Endoscopy Anesthesia Type: MAC Level of consciousness: awake and alert, patient cooperative and oriented Pain management: pain level controlled Vital Signs Assessment: post-procedure vital signs reviewed and stable Respiratory status: spontaneous breathing, nonlabored ventilation and respiratory function stable Cardiovascular status: stable and blood pressure returned to baseline Postop Assessment: no apparent nausea or vomiting Anesthetic complications: no   No notable events documented.  Last Vitals:  Vitals:   08/29/23 1120 08/29/23 1125  BP: 125/71 138/76  Pulse: 65 61  Resp: 13 16  Temp: 36.7 C   SpO2: 96% 96%    Last Pain:  Vitals:   08/29/23 1125  TempSrc:   PainSc: 3                  Jayleena Stille,E. Tarell Schollmeyer

## 2023-08-29 NOTE — Op Note (Signed)
 [facility] Patient Name: Jerry Keller Procedure Date: 08/29/2023 MRN: [id number] Attending MD: Alvis Jourdain , MD, [id number] Date of Birth: 12-Nov-1960 CSN: [id number] Age: 63 Admit Type: Ambulatory Procedure:                Colonoscopy Indications:              Screening for colorectal malignant neoplasm Providers:                Alvis Jourdain, MD, Suzann Ernst, RN, Gabino Joe, Technician Referring MD:              Medicines:                Propofol  per Anesthesia Complications:            No immediate complications. Estimated Blood Loss:     Estimated blood loss: none. Procedure:                Pre-Anesthesia Assessment:                           - Prior to the procedure, a History and Physical                            was performed, and patient medications and                            allergies were reviewed. The patient's tolerance of                            previous anesthesia was also reviewed. The risks                            and benefits of the procedure and the sedation                            options and risks were discussed with the patient.                            All questions were answered, and informed consent                            was obtained. Prior Anticoagulants: The patient has                            taken no anticoagulant or antiplatelet agents. ASA                            Grade Assessment: III - A patient with severe                            systemic disease. After reviewing the risks and  benefits, the patient was deemed in satisfactory                            condition to undergo the procedure.                           - Sedation was administered by an anesthesia                            professional. Deep sedation was attained.                           After obtaining informed consent, the colonoscope                            was passed under  direct vision. Throughout the                            procedure, the patient's blood pressure, pulse, and                            oxygen saturations were monitored continuously. The                            CF-HQ190L ([id number]) Olympus colonoscope was                            introduced through the anus and advanced to the the                            cecum, identified by appendiceal orifice and                            ileocecal valve. The colonoscopy was performed                            without difficulty. The patient tolerated the                            procedure well. The quality of the bowel                            preparation was evaluated using the BBPS [facility]                            Bowel Preparation Scale) with scores of: Right                            Colon = 3, Transverse Colon = 3 and Left Colon = 3                            (entire mucosa seen well with no residual staining,  small fragments of stool or opaque liquid). The                            total BBPS score equals 9. The ileocecal valve,                            appendiceal orifice, and rectum were photographed. Scope In: 10:54:19 AM Scope Out: 11:02:50 AM Scope Withdrawal Time: 0 hours 6 minutes 57 seconds  Total Procedure Duration: 0 hours 8 minutes 31 seconds  Findings:      The entire examined colon appeared normal. Impression:               - The entire examined colon is normal.                           - No specimens collected. Moderate Sedation:      Not Applicable - Patient had care per Anesthesia. Recommendation:           - Patient has a contact number available for                            emergencies. The signs and symptoms of potential                            delayed complications were discussed with the                            patient. Return to normal activities tomorrow.                            Written discharge instructions were  provided to the                            patient.                           - Resume regular diet.                           - Continue present medications.                           - Repeat colonoscopy in 10 years for screening                            purposes. Procedure Code(s):        --- Professional ---                           [phone], Colonoscopy, flexible; diagnostic, including                            collection of specimen(s) by brushing or washing,                            when performed (separate procedure) Diagnosis Code(s):        ---  Professional ---                           Z12.11, Encounter for screening for malignant                            neoplasm of colon CPT copyright 2022 [employer]. All rights reserved. The codes documented in this report are preliminary and upon coder review may  be revised to meet current compliance requirements. Alvis Jourdain, MD Alvis Jourdain, MD 08/29/2023 11:12:57 AM This report has been signed electronically. Number of Addenda: 0

## 2023-08-29 NOTE — Transfer of Care (Signed)
 Immediate Anesthesia Transfer of Care Note  Patient: Jerry Keller  Procedure(s) Performed: COLONOSCOPY  Patient Location: PACU  Anesthesia Type:MAC  Level of Consciousness: awake, alert , oriented, and drowsy  Airway & Oxygen Therapy: Patient Spontanous Breathing and Patient connected to face mask oxygen  Post-op Assessment: Report given to RN and Post -op Vital signs reviewed and stable  Post vital signs: Reviewed and stable  Last Vitals:  Vitals Value Taken Time  BP 119/75 08/29/23 1107  Temp    Pulse 69 08/29/23 1109  Resp 14 08/29/23 1109  SpO2 97 % 08/29/23 1109  Vitals shown include unfiled device data.  Last Pain:  Vitals:   08/29/23 1034  TempSrc: Temporal  PainSc: 3          Complications: No notable events documented.

## 2023-08-31 ENCOUNTER — Encounter (HOSPITAL_COMMUNITY): Payer: Self-pay | Admitting: Gastroenterology

## 2023-12-08 ENCOUNTER — Other Ambulatory Visit: Payer: Self-pay | Admitting: Orthopaedic Surgery

## 2023-12-08 ENCOUNTER — Ambulatory Visit
Admission: RE | Admit: 2023-12-08 | Discharge: 2023-12-08 | Disposition: A | Discharge status: Home / Self Care | Source: Ambulatory Visit | Attending: Orthopaedic Surgery | Admitting: Orthopaedic Surgery

## 2023-12-08 DIAGNOSIS — Z01818 Encounter for other preprocedural examination: Secondary | ICD-10-CM

## 2023-12-08 DIAGNOSIS — G8929 Other chronic pain: Secondary | ICD-10-CM
